# Patient Record
Sex: Female | Born: 1955 | Race: White | Hispanic: No | Marital: Single | Smoking: Never smoker
Health system: Southern US, Community
[De-identification: ages and names within clinical notes are randomized; demographics above are authoritative.]

## PROBLEM LIST (undated history)

## (undated) DIAGNOSIS — R002 Palpitations: Secondary | ICD-10-CM

## (undated) DIAGNOSIS — I10 Essential (primary) hypertension: Secondary | ICD-10-CM

## (undated) DIAGNOSIS — F319 Bipolar disorder, unspecified: Secondary | ICD-10-CM

## (undated) DIAGNOSIS — F329 Major depressive disorder, single episode, unspecified: Secondary | ICD-10-CM

## (undated) DIAGNOSIS — E7800 Pure hypercholesterolemia, unspecified: Secondary | ICD-10-CM

## (undated) DIAGNOSIS — R011 Cardiac murmur, unspecified: Secondary | ICD-10-CM

## (undated) DIAGNOSIS — M549 Dorsalgia, unspecified: Secondary | ICD-10-CM

## (undated) DIAGNOSIS — F32A Depression, unspecified: Secondary | ICD-10-CM

## (undated) DIAGNOSIS — G8929 Other chronic pain: Secondary | ICD-10-CM

## (undated) DIAGNOSIS — Z8711 Personal history of peptic ulcer disease: Secondary | ICD-10-CM

## (undated) DIAGNOSIS — M1990 Unspecified osteoarthritis, unspecified site: Secondary | ICD-10-CM

## (undated) DIAGNOSIS — F419 Anxiety disorder, unspecified: Secondary | ICD-10-CM

## (undated) DIAGNOSIS — K219 Gastro-esophageal reflux disease without esophagitis: Secondary | ICD-10-CM

## (undated) DIAGNOSIS — Z8719 Personal history of other diseases of the digestive system: Secondary | ICD-10-CM

## (undated) DIAGNOSIS — G479 Sleep disorder, unspecified: Secondary | ICD-10-CM

## (undated) DIAGNOSIS — K449 Diaphragmatic hernia without obstruction or gangrene: Secondary | ICD-10-CM

## (undated) HISTORY — DX: Major depressive disorder, single episode, unspecified: F32.9

## (undated) HISTORY — DX: Depression, unspecified: F32.A

## (undated) HISTORY — PX: CARPAL TUNNEL RELEASE: SHX101

## (undated) HISTORY — DX: Pure hypercholesterolemia, unspecified: E78.00

## (undated) HISTORY — PX: BACK SURGERY: SHX140

## (undated) HISTORY — DX: Anxiety disorder, unspecified: F41.9

## (undated) HISTORY — DX: Essential (primary) hypertension: I10

## (undated) HISTORY — DX: Palpitations: R00.2

## (undated) HISTORY — PX: APPENDECTOMY: SHX54

## (undated) HISTORY — DX: Diaphragmatic hernia without obstruction or gangrene: K44.9

---

## 2001-01-06 ENCOUNTER — Inpatient Hospital Stay (HOSPITAL_COMMUNITY): Admission: AD | Admit: 2001-01-06 | Discharge: 2001-01-06 | Payer: Self-pay | Admitting: *Deleted

## 2009-06-17 ENCOUNTER — Emergency Department (HOSPITAL_COMMUNITY): Admission: EM | Admit: 2009-06-17 | Discharge: 2009-06-17 | Payer: Self-pay | Admitting: Emergency Medicine

## 2010-07-29 LAB — POCT I-STAT, CHEM 8
BUN: 11 mg/dL (ref 6–23)
Glucose, Bld: 76 mg/dL (ref 70–99)
HCT: 35 % — ABNORMAL LOW (ref 36.0–46.0)
Hemoglobin: 11.9 g/dL — ABNORMAL LOW (ref 12.0–15.0)
TCO2: 27 mmol/L (ref 0–100)

## 2010-07-29 LAB — POCT CARDIAC MARKERS
CKMB, poc: 1.6 ng/mL (ref 1.0–8.0)
CKMB, poc: 1.7 ng/mL (ref 1.0–8.0)
Myoglobin, poc: 39.4 ng/mL (ref 12–200)
Troponin i, poc: <0.05 ng/mL (ref 0.00–0.09)

## 2010-07-29 LAB — CBC
HCT: 34.6 % — ABNORMAL LOW (ref 36.0–46.0)
Platelets: 190 10*3/uL (ref 150–400)
RBC: 3.75 MIL/uL — ABNORMAL LOW (ref 3.87–5.11)
RDW: 13.4 % (ref 11.5–15.5)

## 2010-07-29 LAB — DIFFERENTIAL
Basophils Absolute: 0 10*3/uL (ref 0.0–0.1)
Eosinophils Absolute: 0.1 10*3/uL (ref 0.0–0.7)
Eosinophils Relative: 3 % (ref 0–5)
Lymphocytes Relative: 37 % (ref 12–46)
Monocytes Relative: 10 % (ref 3–12)
Neutrophils Relative %: 48 % (ref 43–77)

## 2010-07-29 LAB — PROTIME-INR: INR: 0.96 (ref 0.00–1.49)

## 2010-07-29 LAB — URINALYSIS, ROUTINE W REFLEX MICROSCOPIC
Bilirubin Urine: NEGATIVE
Glucose, UA: NEGATIVE mg/dL
pH: 7 (ref 5.0–8.0)

## 2011-02-08 ENCOUNTER — Observation Stay (HOSPITAL_COMMUNITY)
Admission: EM | Admit: 2011-02-08 | Discharge: 2011-02-09 | Disposition: A | Discharge status: Home / Self Care | Payer: Self-pay | Attending: Emergency Medicine | Admitting: Emergency Medicine

## 2011-02-08 DIAGNOSIS — F411 Generalized anxiety disorder: Secondary | ICD-10-CM | POA: Insufficient documentation

## 2011-02-08 DIAGNOSIS — K449 Diaphragmatic hernia without obstruction or gangrene: Secondary | ICD-10-CM | POA: Insufficient documentation

## 2011-02-08 DIAGNOSIS — R0602 Shortness of breath: Secondary | ICD-10-CM | POA: Insufficient documentation

## 2011-02-08 DIAGNOSIS — R079 Chest pain, unspecified: Principal | ICD-10-CM | POA: Insufficient documentation

## 2011-02-08 LAB — COMPREHENSIVE METABOLIC PANEL
ALT: 11 U/L (ref 0–35)
AST: 17 U/L (ref 0–37)
Albumin: 4.3 g/dL (ref 3.5–5.2)
BUN: 25 mg/dL — ABNORMAL HIGH (ref 6–23)
Creatinine, Ser: 0.95 mg/dL (ref 0.50–1.10)
GFR calc non Af Amer: 66 mL/min — ABNORMAL LOW (ref >90)
Sodium: 140 mEq/L (ref 135–145)

## 2011-02-08 LAB — DIFFERENTIAL
Basophils Relative: 1 % (ref 0–1)
Eosinophils Absolute: 0.2 10*3/uL (ref 0.0–0.7)
Eosinophils Relative: 2 % (ref 0–5)
Lymphs Abs: 2.2 10*3/uL (ref 0.7–4.0)
Neutro Abs: 5.5 10*3/uL (ref 1.7–7.7)
Neutrophils Relative %: 64 % (ref 43–77)

## 2011-02-08 LAB — CBC
Hemoglobin: 13.3 g/dL (ref 12.0–15.0)
MCHC: 35.5 g/dL (ref 30.0–36.0)
MCV: 88.4 fL (ref 78.0–100.0)
RBC: 4.24 MIL/uL (ref 3.87–5.11)
RDW: 12.4 % (ref 11.5–15.5)

## 2011-02-08 LAB — POCT I-STAT TROPONIN I: Troponin i, poc: 0 ng/mL (ref 0.00–0.08)

## 2011-02-08 LAB — CK TOTAL AND CKMB (NOT AT ARMC): Total CK: 98 U/L (ref 7–177)

## 2011-02-08 LAB — LIPASE, BLOOD: Lipase: 48 U/L (ref 11–59)

## 2011-02-09 ENCOUNTER — Telehealth: Payer: Self-pay | Admitting: *Deleted

## 2011-02-09 DIAGNOSIS — I4891 Unspecified atrial fibrillation: Secondary | ICD-10-CM

## 2011-02-09 LAB — POCT I-STAT TROPONIN I

## 2011-02-09 NOTE — Telephone Encounter (Signed)
Dr Elease Hashimoto saw pt in ER, wants lexiscan/ cp,  scheduled and f/u visit in a few weeks. Cell number [phone]

## 2011-02-10 ENCOUNTER — Telehealth: Payer: Self-pay | Admitting: *Deleted

## 2011-02-10 NOTE — Telephone Encounter (Signed)
Message copied by Antony Odea on Thu Feb 10, 2011  3:16 PM ------      Message from: Antony Odea      Created: Wed Feb 09, 2011  1:38 PM      Regarding: lexi app        [phone]

## 2011-02-10 NOTE — Telephone Encounter (Signed)
Pt not on bp meds.

## 2011-02-22 ENCOUNTER — Other Ambulatory Visit (HOSPITAL_COMMUNITY): Payer: Self-pay | Admitting: Radiology

## 2011-03-02 ENCOUNTER — Ambulatory Visit (HOSPITAL_COMMUNITY): Payer: Self-pay | Attending: Cardiovascular Disease | Admitting: Radiology

## 2011-03-02 ENCOUNTER — Other Ambulatory Visit (HOSPITAL_COMMUNITY): Payer: Self-pay | Admitting: Radiology

## 2011-03-02 DIAGNOSIS — R0609 Other forms of dyspnea: Secondary | ICD-10-CM

## 2011-03-02 DIAGNOSIS — R0789 Other chest pain: Secondary | ICD-10-CM

## 2011-03-02 DIAGNOSIS — R079 Chest pain, unspecified: Secondary | ICD-10-CM | POA: Insufficient documentation

## 2011-03-02 MED ORDER — TECHNETIUM TC 99M TETROFOSMIN IV KIT
11.0000 | PACK | Freq: Once | INTRAVENOUS | Status: AC | PRN
  Administered 2011-03-02: 11 via INTRAVENOUS

## 2011-03-02 MED ORDER — REGADENOSON 0.4 MG/5ML IV SOLN
0.4000 mg | Freq: Once | INTRAVENOUS | Status: AC
  Administered 2011-03-02: 0.4 mg via INTRAVENOUS

## 2011-03-02 MED ORDER — TECHNETIUM TC 99M TETROFOSMIN IV KIT
33.0000 | PACK | Freq: Once | INTRAVENOUS | Status: AC | PRN
  Administered 2011-03-02: 33 via INTRAVENOUS

## 2011-03-02 NOTE — Progress Notes (Signed)
[facility] SITE 3 NUCLEAR MED [address] [place] [address] [phone]  Cardiology Nuclear Med Study  [name] is a 55 y.o. female [id number] 1955/11/04   Nuclear Med Background Indication for Stress Test:  Evaluation for Ischemia and Post Hospital 02/10/11 with CP, (-) enzymes History:  02/09/11 Stress Echo:Poor exercise tolerance, no wall abnormalities>Myoview ordered Cardiac Risk Factors: Family History - CAD  Symptoms:  Chest Pain/Pressure with and without Exertion, radiating to (L) arm (last episode of chest discomfort ), DOE, Fatigue, Nausea, Palpitations, Rapid HR and Vomiting   Nuclear Pre-Procedure Caffeine/Decaff Intake:  None NPO After: 7:00pm   Lungs:  Clear.  O2 SAT 99% on RA. IV 0.9% NS with Angio Cath:  20g  IV Site: R Antecubital  IV Started by:  Stanton Kidney, EMT-P  Chest Size (in):  32 Cup Size: B  Height: 5\' 3"  (1.6 m)  Weight:  164 lb (74.39 kg)  BMI:  Body mass index is 29.05 kg/(m^2). Tech Comments:  NA    Nuclear Med Study 1 or 2 day study: 1 day  Stress Test Type:  Lexiscan  Reading MD: Willa Rough, MD  Order Authorizing Provider:  Kristeen Miss, MD  Resting Radionuclide: Technetium 20m Tetrofosmin  Resting Radionuclide Dose: 11.0 mCi   Stress Radionuclide:  Technetium 30m Tetrofosmin  Stress Radionuclide Dose: 33.0 mCi           Stress Protocol Rest HR: 78 Stress HR: 116  Rest BP: 146/94 Stress BP: 151/91  Exercise Time (min): n/a METS: n/a   Predicted Max HR: 165 bpm % Max HR: 70.3 bpm Rate Pressure Product: 21308   Dose of Adenosine (mg):  n/a Dose of Lexiscan: 0.4 mg  Dose of Atropine (mg): n/a Dose of Dobutamine: n/a mcg/kg/min (at max HR)  Stress Test Technologist: Smiley Houseman, CMA-N  Nuclear Technologist:  Domenic Polite, CNMT     Rest Procedure:  Myocardial perfusion imaging was performed at rest 45 minutes following the intravenous administration of Technetium 73m Tetrofosmin.  Rest ECG: No  acute changes.  Stress Procedure:  The patient received IV Lexiscan 0.4 mg over 15-seconds.  Technetium 74m Tetrofosmin injected at 30-seconds.  There were nonspecific T-wave changes with Lexiscan, probably rare related.  Quantitative spect images were obtained after a 45 minute delay.  Stress ECG: No significant change from baseline ECG   QPS Raw Data Images:  Patient motion noted; appropriate software correction applied. Stress Images:  Normal homogeneous uptake in all areas of the myocardium. Rest Images:  Normal homogeneous uptake in all areas of the myocardium. Subtraction (SDS):  No evidence of ischemia. Transient Ischemic Dilatation (Normal <1.22):  1.07 Lung/Heart Ratio (Normal <0.45):  0.33  Quantitative Gated Spect Images QGS EDV:  60 ml QGS ESV:  21 ml QGS cine images:  Normal Wall Motion QGS EF: 65%  Impression Exercise Capacity:  Lexiscan with no exercise. BP Response:  Normal blood pressure response. Clinical Symptoms:  breathing hard ECG Impression:  No significant ST segment change suggestive of ischemia. Comparison with Prior Nuclear Study: No previous nuclear study performed  Overall Impression:  Normal stress nuclear study.  Willa Rough

## 2011-03-04 ENCOUNTER — Ambulatory Visit: Payer: Self-pay | Admitting: Cardiovascular Disease

## 2011-03-04 ENCOUNTER — Encounter: Payer: Self-pay | Admitting: Cardiovascular Disease

## 2011-03-07 ENCOUNTER — Telehealth: Payer: Self-pay | Admitting: *Deleted

## 2011-03-07 NOTE — Telephone Encounter (Signed)
Message copied by Antony Odea on Mon Mar 07, 2011  3:13 PM ------      Message from: [place], [place] J      Created: Thu Mar 03, 2011 12:45 PM       Normal stress test

## 2011-03-07 NOTE — Telephone Encounter (Signed)
Pt called for results, returned call, she missed app for 10/26, normal results given and app made for f/u.Pt verbalized understanding. Alfonso Ramus RN

## 2011-03-16 ENCOUNTER — Encounter: Payer: Self-pay | Admitting: Cardiovascular Disease

## 2011-03-18 ENCOUNTER — Ambulatory Visit: Payer: Self-pay | Admitting: Cardiovascular Disease

## 2012-04-11 ENCOUNTER — Other Ambulatory Visit (HOSPITAL_COMMUNITY): Payer: Self-pay | Admitting: Physician Assistant

## 2012-04-11 DIAGNOSIS — Z139 Encounter for screening, unspecified: Secondary | ICD-10-CM

## 2012-04-20 ENCOUNTER — Ambulatory Visit (HOSPITAL_COMMUNITY): Payer: Self-pay

## 2012-04-24 ENCOUNTER — Ambulatory Visit (HOSPITAL_COMMUNITY): Payer: Self-pay

## 2012-04-30 ENCOUNTER — Ambulatory Visit (HOSPITAL_COMMUNITY): Payer: Self-pay

## 2012-05-10 ENCOUNTER — Ambulatory Visit (HOSPITAL_COMMUNITY): Payer: Self-pay

## 2012-05-11 ENCOUNTER — Ambulatory Visit (HOSPITAL_COMMUNITY)
Admission: RE | Admit: 2012-05-11 | Discharge: 2012-05-11 | Disposition: A | Discharge status: Home / Self Care | Payer: Self-pay | Source: Ambulatory Visit | Attending: Physician Assistant | Admitting: Physician Assistant

## 2012-05-11 DIAGNOSIS — Z139 Encounter for screening, unspecified: Secondary | ICD-10-CM

## 2012-12-04 ENCOUNTER — Emergency Department (HOSPITAL_COMMUNITY): Payer: Medicaid Other

## 2012-12-04 ENCOUNTER — Encounter (HOSPITAL_COMMUNITY): Payer: Self-pay | Admitting: *Deleted

## 2012-12-04 ENCOUNTER — Other Ambulatory Visit (HOSPITAL_COMMUNITY): Payer: Self-pay | Admitting: Physician Assistant

## 2012-12-04 ENCOUNTER — Emergency Department (HOSPITAL_COMMUNITY)
Admission: EM | Admit: 2012-12-04 | Discharge: 2012-12-04 | Disposition: A | Discharge status: Home / Self Care | Payer: Medicaid Other | Attending: Emergency Medicine | Admitting: Emergency Medicine

## 2012-12-04 ENCOUNTER — Ambulatory Visit (HOSPITAL_COMMUNITY)
Admission: RE | Admit: 2012-12-04 | Discharge: 2012-12-04 | Disposition: A | Discharge status: Home / Self Care | Payer: Medicaid Other | Source: Ambulatory Visit | Attending: Physician Assistant | Admitting: Physician Assistant

## 2012-12-04 DIAGNOSIS — K2970 Gastritis, unspecified, without bleeding: Secondary | ICD-10-CM

## 2012-12-04 DIAGNOSIS — R109 Unspecified abdominal pain: Secondary | ICD-10-CM

## 2012-12-04 DIAGNOSIS — E876 Hypokalemia: Secondary | ICD-10-CM

## 2012-12-04 DIAGNOSIS — R630 Anorexia: Secondary | ICD-10-CM | POA: Insufficient documentation

## 2012-12-04 DIAGNOSIS — Z8719 Personal history of other diseases of the digestive system: Secondary | ICD-10-CM | POA: Insufficient documentation

## 2012-12-04 DIAGNOSIS — Z79899 Other long term (current) drug therapy: Secondary | ICD-10-CM | POA: Insufficient documentation

## 2012-12-04 DIAGNOSIS — K8020 Calculus of gallbladder without cholecystitis without obstruction: Secondary | ICD-10-CM

## 2012-12-04 DIAGNOSIS — Z9089 Acquired absence of other organs: Secondary | ICD-10-CM | POA: Insufficient documentation

## 2012-12-04 DIAGNOSIS — F411 Generalized anxiety disorder: Secondary | ICD-10-CM | POA: Insufficient documentation

## 2012-12-04 DIAGNOSIS — M549 Dorsalgia, unspecified: Secondary | ICD-10-CM | POA: Insufficient documentation

## 2012-12-04 DIAGNOSIS — Z791 Long term (current) use of non-steroidal anti-inflammatories (NSAID): Secondary | ICD-10-CM | POA: Insufficient documentation

## 2012-12-04 LAB — URINE MICROSCOPIC-ADD ON

## 2012-12-04 LAB — CBC WITH DIFFERENTIAL/PLATELET
Eosinophils Absolute: 0.2 10*3/uL (ref 0.0–0.7)
Eosinophils Relative: 3 % (ref 0–5)
HCT: 35.2 % — ABNORMAL LOW (ref 36.0–46.0)
Lymphs Abs: 1.5 10*3/uL (ref 0.7–4.0)
MCH: 30.9 pg (ref 26.0–34.0)
MCV: 90.7 fL (ref 78.0–100.0)
Monocytes Absolute: 0.4 10*3/uL (ref 0.1–1.0)
Platelets: 215 10*3/uL (ref 150–400)
RBC: 3.88 MIL/uL (ref 3.87–5.11)
RDW: 12.8 % (ref 11.5–15.5)

## 2012-12-04 LAB — URINALYSIS, ROUTINE W REFLEX MICROSCOPIC
Protein, ur: NEGATIVE mg/dL
Urobilinogen, UA: 0.2 mg/dL (ref 0.0–1.0)

## 2012-12-04 LAB — COMPREHENSIVE METABOLIC PANEL
ALT: 11 U/L (ref 0–35)
Calcium: 9.9 mg/dL (ref 8.4–10.5)
Creatinine, Ser: 0.76 mg/dL (ref 0.50–1.10)
GFR calc Af Amer: >90 mL/min (ref >90)
GFR calc non Af Amer: >90 mL/min (ref >90)
Glucose, Bld: 88 mg/dL (ref 70–99)
Sodium: 137 mEq/L (ref 135–145)
Total Protein: 8.2 g/dL (ref 6.0–8.3)

## 2012-12-04 LAB — LIPASE, BLOOD: Lipase: 30 U/L (ref 11–59)

## 2012-12-04 MED ORDER — OMEPRAZOLE 20 MG PO CPDR: 20.0000 mg | DELAYED_RELEASE_CAPSULE | Freq: Every day | ORAL | Status: DC

## 2012-12-04 MED ORDER — MORPHINE SULFATE 4 MG/ML IJ SOLN: 4.0000 mg | Freq: Once | INTRAMUSCULAR | Status: DC

## 2012-12-04 MED ORDER — SODIUM CHLORIDE 0.9 % IV BOLUS (SEPSIS)
500.0000 mL | Freq: Once | INTRAVENOUS | Status: AC
  Administered 2012-12-04: 500 mL via INTRAVENOUS

## 2012-12-04 MED ORDER — GI COCKTAIL ~~LOC~~
ORAL | Status: AC
  Filled 2012-12-04: qty 30

## 2012-12-04 MED ORDER — GI COCKTAIL ~~LOC~~
30.0000 mL | Freq: Once | ORAL | Status: AC
  Administered 2012-12-04: 30 mL via ORAL

## 2012-12-04 NOTE — ED Notes (Signed)
Patient with no complaints at this time. Respirations even and unlabored. Skin warm/dry. Discharge instructions reviewed with patient at this time. Patient given opportunity to voice concerns/ask questions. IV removed per policy and band-aid applied to site. Patient discharged at this time and left Emergency Department with steady gait.  

## 2012-12-04 NOTE — ED Notes (Signed)
Upper abd pain and back pain x 5 days.  Denies n/v/d.   Decreased appetite.

## 2012-12-04 NOTE — ED Notes (Signed)
Pt returned from ultrasound, states that the gi cocktail seemed to help with the pain ,

## 2012-12-04 NOTE — ED Notes (Addendum)
Pt presents to er with c/o abd pain for the past 5 days, denies any n/v/d, has hx of hiatal hernia and states that she first started having the pain after carrying groceries into the house and helping lift a cough. Pain is located epigastric area and radiates around to left abd area.  [facility] and [facility]d. [facility] did not notify [facility] that she now has [facility], once [facility] was notified that pt had [facility] all exams were canceled and pt was advised that if she was hurting she would need to be seen in er.

## 2012-12-04 NOTE — ED Provider Notes (Signed)
CSN: [id number]     Arrival date & time 12/04/12  1051 History    This chart was scribed for No att. providers found, by Yevette Edwards, ED Scribe. This patient was seen in room APA06/[facility]06 and the patient's care was started at 11:46 AM.   None    Chief Complaint  Patient presents with  . Abdominal Pain    The history is provided by the patient. No language interpreter was used.   HPI Comments:  [name] is a 57 y.o. female who presents to the Emergency Department complaining of constant abdominal pain which radiates from her epigastrium to her periumbilical area and to her back. The pt states she has experienced the abdominal pain for the past five days. She ranks the pain as a 8/10 or a 9/10. The pt has attempted to treat the pain with Ben-gay, but with little resolution. She states that eating increases the abdominal pain. The pt has also experienced a diminished appetite. Her last BM was yesterday, and it was normal. She denies a h/o similar symptoms. The pt also denies experiencing any fever, emesis, cough, diarrhea, dysuria, or hematuria. The pt denies a h/o pancreatitis and ulcers.  The pt has a h/o abdominal surgeries. She denies drinking alcohol and smoking.  Past Medical History  Diagnosis Date  . Chest pain   . Hiatal hernia   . SOB (shortness of breath)   . Palpitations   . PND (paroxysmal nocturnal dyspnea)   . Anxiety    Past Surgical History  Procedure Laterality Date  . Appendectomy    . Carpel tunnel     Family History  Problem Relation Age of Onset  . Heart attack Mother    History  Substance Use Topics  . Smoking status: Never Smoker   . Smokeless tobacco: Not on file  . Alcohol Use: No   No OB history provided.  Review of Systems  Constitutional: Positive for appetite change. Negative for fever.  HENT: Negative for congestion, sore throat and rhinorrhea.   Respiratory: Negative for cough.   Gastrointestinal: Positive for abdominal pain. Negative  for nausea, vomiting and diarrhea.  Genitourinary: Negative for dysuria and hematuria.  Musculoskeletal: Positive for back pain.  Skin: Negative for rash.  All other systems reviewed and are negative.    Allergies  Review of patient's allergies indicates no known allergies.  Home Medications   Current Outpatient Rx  Name  Route  Sig  Dispense  Refill  . ALPRAZolam (XANAX) 1 MG tablet   Oral   Take 2 mg by mouth 3 (three) times daily as needed for anxiety.          . Aspirin-Acetaminophen (GOODYS BODY PAIN PO)   Oral   Take 1 Package by mouth daily as needed (pain).         Marland Kitchen lamoTRIgine (LAMICTAL) 200 MG tablet   Oral   Take 200 mg by mouth 3 (three) times daily.         Marland Kitchen lisinopril (PRINIVIL,ZESTRIL) 20 MG tablet   Oral   Take 20 mg by mouth daily.         . meloxicam (MOBIC) 15 MG tablet   Oral   Take 15 mg by mouth daily.         . simvastatin (ZOCOR) 20 MG tablet   Oral   Take 20 mg by mouth every evening.          Triage Vitals: BP 162/99  Pulse 89  Temp(Src) 98.6 F (37 C) (Oral)  Resp 16  Ht 5\' 3"  (1.6 m)  Wt 142 lb (64.411 kg)  BMI 25.16 kg/m2  SpO2 100%  Physical Exam  Nursing note and vitals reviewed. Constitutional: She is oriented to person, place, and time. She appears well-developed and well-nourished. No distress.  HENT:  Head: Normocephalic and atraumatic.  Mouth/Throat: Oropharynx is clear and moist.  Eyes: EOM are normal. Pupils are equal, round, and reactive to light.  Neck: Neck supple. No tracheal deviation present.  Cardiovascular: Normal rate, regular rhythm, normal heart sounds and intact distal pulses.   Pulmonary/Chest: Effort normal and breath sounds normal. No respiratory distress.  Abdominal: Soft. Bowel sounds are normal. She exhibits no distension. There is tenderness.  Epigastrium tenderness.   Musculoskeletal: Normal range of motion.  Neurological: She is alert and oriented to person, place, and time.  Skin:  Skin is warm and dry.  Psychiatric: She has a normal mood and affect. Her behavior is normal.    ED Course   DIAGNOSTIC STUDIES: Oxygen Saturation is 100% on room air, normal by my interpretation.    COORDINATION OF CARE:  11:53 AM- Discussed treatment plan with patient, and the patient agreed to the plan.   Procedures (including critical care time)  Results for orders placed during the hospital encounter of 12/04/12  URINALYSIS, ROUTINE W REFLEX MICROSCOPIC      Result Value Range   Color, Urine YELLOW  YELLOW   APPearance CLEAR  CLEAR   Specific Gravity, Urine >1.030 (*) 1.005 - 1.030   pH 6.0  5.0 - 8.0   Glucose, UA NEGATIVE  NEGATIVE mg/dL   Hgb urine dipstick TRACE (*) NEGATIVE   Bilirubin Urine NEGATIVE  NEGATIVE   Ketones, ur NEGATIVE  NEGATIVE mg/dL   Protein, ur NEGATIVE  NEGATIVE mg/dL   Urobilinogen, UA 0.2  0.0 - 1.0 mg/dL   Nitrite NEGATIVE  NEGATIVE   Leukocytes, UA TRACE (*) NEGATIVE  URINE MICROSCOPIC-ADD ON      Result Value Range   Squamous Epithelial / LPF FEW (*) RARE   WBC, UA 0-2  <3 WBC/hpf   RBC / HPF 0-2  <3 RBC/hpf  CBC WITH DIFFERENTIAL      Result Value Range   WBC 8.1  4.0 - 10.5 K/uL   RBC 3.88  3.87 - 5.11 MIL/uL   Hemoglobin 12.0  12.0 - 15.0 g/dL   HCT 16.1 (*) 09.6 - 04.5 %   MCV 90.7  78.0 - 100.0 fL   MCH 30.9  26.0 - 34.0 pg   MCHC 34.1  30.0 - 36.0 g/dL   RDW 40.9  81.1 - 91.4 %   Platelets 215  150 - 400 K/uL   Neutrophils Relative % 73  43 - 77 %   Neutro Abs 5.9  1.7 - 7.7 K/uL   Lymphocytes Relative 19  12 - 46 %   Lymphs Abs 1.5  0.7 - 4.0 K/uL   Monocytes Relative 5  3 - 12 %   Monocytes Absolute 0.4  0.1 - 1.0 K/uL   Eosinophils Relative 3  0 - 5 %   Eosinophils Absolute 0.2  0.0 - 0.7 K/uL   Basophils Relative 1  0 - 1 %   Basophils Absolute 0.1  0.0 - 0.1 K/uL  COMPREHENSIVE METABOLIC PANEL      Result Value Range   Sodium 137  135 - 145 mEq/L   Potassium 3.2 (*) 3.5 - 5.1 mEq/L  Chloride 100  96 - 112 mEq/L    CO2 28  19 - 32 mEq/L   Glucose, Bld 88  70 - 99 mg/dL   BUN 16  6 - 23 mg/dL   Creatinine, Ser 1.30  0.50 - 1.10 mg/dL   Calcium 9.9  8.4 - 86.5 mg/dL   Total Protein 8.2  6.0 - 8.3 g/dL   Albumin 4.2  3.5 - 5.2 g/dL   AST 18  0 - 37 U/L   ALT 11  0 - 35 U/L   Alkaline Phosphatase 117  39 - 117 U/L   Total Bilirubin 0.3  0.3 - 1.2 mg/dL   GFR calc non Af Amer >90  >90 mL/min   GFR calc Af Amer >90  >90 mL/min  LIPASE, BLOOD      Result Value Range   Lipase 30  11 - 59 U/L   Dg Chest Portable 1 View  12/04/2012   *RADIOLOGY REPORT*  Clinical Data: Epigastric pain since last Thursday.  History of hypertension, hiatal hernia.  PORTABLE CHEST - 1 VIEW  Comparison: 06/17/2009  Findings: Cardiomediastinal silhouette is within normal limits. There are no focal consolidations or pleural effusions.  There is minimal atelectasis or scarring at the right lung base.  No free intraperitoneal air seen beneath the diaphragm.  IMPRESSION: No evidence for acute cardiopulmonary abnormality.   Original Report Authenticated By: Norva Pavlov, M.D.   US Abdomen Limited Ruq  12/04/2012   *RADIOLOGY REPORT*  Clinical Data:  Right upper quadrant pain.  Previous appendectomy.  LIMITED ABDOMINAL ULTRASOUND - RIGHT UPPER QUADRANT  Comparison:  None.  Findings:  Gallbladder:  Multiple gallstones are present.  No sonographic Murphy's sign.  Gallbladder wall is normal in thickness, 1.6 mm. No pericholecystic fluid.  Common bile duct:  Common bile duct is normal in caliber, 5.0 mm.  Liver:  A simple cyst is seen in the anterior aspect of the left hepatic lobe, 3.0 x 2.9 x 4.4 cm.  This abuts the hepatic capsule and could be a source of pain.  No perihepatic fluid.  IMPRESSION:  1.  Multiple gallstones without other evidence for acute cholecystitis. 2.  4.4 cm simple cyst in the left hepatic lobe, a possible source of the patient's pain.                    Original Report Authenticated By: Norva Pavlov, M.D.       MDM  Ms. Texidor presented to the emergency department with epigastric pain radiating to her upper back. Her vitals were all stable. Her exam is consistent with probable gastritis versus pancreatitis versus gallbladder disease. Labs are reassuring. Ultrasound of gallbladder shows stones without acute cholecystitis. Patient reassessed and much improved after GI cocktail. Suspect most likely etiology is esophagitis versus gastritis versus ulcer. Will DC patient with PPI, followup and return precautions.  Doubt acute surgical, vascular or serious infectious etiology at this time.  UA with ?UTI, but pt w/o sx.  Will send cx  I personally performed the services described in this documentation, which was scribed in my presence. The recorded information has been reviewed and is accurate.    Ashby Dawes, MD 12/04/12 [phone]

## 2012-12-04 NOTE — ED Notes (Signed)
Dr. Dan Humphreys at bedside,

## 2013-01-16 ENCOUNTER — Emergency Department (HOSPITAL_COMMUNITY)
Admission: EM | Admit: 2013-01-16 | Discharge: 2013-01-16 | Discharge status: Against Medical Advice | Payer: Medicaid Other | Attending: Emergency Medicine | Admitting: Emergency Medicine

## 2013-01-16 ENCOUNTER — Encounter (HOSPITAL_COMMUNITY): Payer: Self-pay | Admitting: *Deleted

## 2013-01-16 DIAGNOSIS — K8020 Calculus of gallbladder without cholecystitis without obstruction: Secondary | ICD-10-CM | POA: Insufficient documentation

## 2013-01-16 NOTE — ED Notes (Signed)
Pt with abd pain, seen recently and dx with gallstones, was instructed to come back if pain gets worse, c/o N/V/d

## 2013-01-16 NOTE — ED Notes (Signed)
Pt stated that she "can't stay for 3 hours to be seen", AMA form signed, pt stated that she would come back in the morning

## 2013-01-17 ENCOUNTER — Emergency Department (HOSPITAL_COMMUNITY)
Admission: EM | Admit: 2013-01-17 | Discharge: 2013-01-17 | Disposition: A | Discharge status: Home / Self Care | Payer: Medicaid Other | Attending: Emergency Medicine | Admitting: Emergency Medicine

## 2013-01-17 ENCOUNTER — Encounter (HOSPITAL_COMMUNITY): Payer: Self-pay | Admitting: Emergency Medicine

## 2013-01-17 DIAGNOSIS — R112 Nausea with vomiting, unspecified: Secondary | ICD-10-CM | POA: Insufficient documentation

## 2013-01-17 DIAGNOSIS — F411 Generalized anxiety disorder: Secondary | ICD-10-CM | POA: Insufficient documentation

## 2013-01-17 DIAGNOSIS — K8020 Calculus of gallbladder without cholecystitis without obstruction: Secondary | ICD-10-CM | POA: Insufficient documentation

## 2013-01-17 DIAGNOSIS — Z8719 Personal history of other diseases of the digestive system: Secondary | ICD-10-CM | POA: Insufficient documentation

## 2013-01-17 DIAGNOSIS — Z79899 Other long term (current) drug therapy: Secondary | ICD-10-CM | POA: Insufficient documentation

## 2013-01-17 DIAGNOSIS — Z8679 Personal history of other diseases of the circulatory system: Secondary | ICD-10-CM | POA: Insufficient documentation

## 2013-01-17 LAB — CBC WITH DIFFERENTIAL/PLATELET
Hemoglobin: 11.2 g/dL — ABNORMAL LOW (ref 12.0–15.0)
Lymphocytes Relative: 40 % (ref 12–46)
Lymphs Abs: 2 10*3/uL (ref 0.7–4.0)
MCH: 30.7 pg (ref 26.0–34.0)
Monocytes Relative: 8 % (ref 3–12)
Neutro Abs: 2.5 10*3/uL (ref 1.7–7.7)
Neutrophils Relative %: 50 % (ref 43–77)
Platelets: 191 10*3/uL (ref 150–400)
RBC: 3.65 MIL/uL — ABNORMAL LOW (ref 3.87–5.11)
WBC: 4.9 10*3/uL (ref 4.0–10.5)

## 2013-01-17 LAB — URINALYSIS, ROUTINE W REFLEX MICROSCOPIC
Glucose, UA: NEGATIVE mg/dL
Ketones, ur: NEGATIVE mg/dL
Nitrite: NEGATIVE
Protein, ur: NEGATIVE mg/dL
pH: 7 (ref 5.0–8.0)

## 2013-01-17 LAB — COMPREHENSIVE METABOLIC PANEL
AST: 20 U/L (ref 0–37)
Albumin: 3.9 g/dL (ref 3.5–5.2)
BUN: 18 mg/dL (ref 6–23)
Calcium: 9.5 mg/dL (ref 8.4–10.5)
Chloride: 103 mEq/L (ref 96–112)
Creatinine, Ser: 0.67 mg/dL (ref 0.50–1.10)
Total Bilirubin: 0.2 mg/dL — ABNORMAL LOW (ref 0.3–1.2)

## 2013-01-17 LAB — LIPASE, BLOOD: Lipase: 47 U/L (ref 11–59)

## 2013-01-17 LAB — URINE MICROSCOPIC-ADD ON

## 2013-01-17 MED ORDER — MORPHINE SULFATE 4 MG/ML IJ SOLN
4.0000 mg | Freq: Once | INTRAMUSCULAR | Status: AC
  Administered 2013-01-17: 4 mg via INTRAVENOUS
  Filled 2013-01-17: qty 1

## 2013-01-17 MED ORDER — OMEPRAZOLE 20 MG PO CPDR: 20.0000 mg | DELAYED_RELEASE_CAPSULE | Freq: Every day | ORAL | Status: DC

## 2013-01-17 MED ORDER — PANTOPRAZOLE SODIUM 40 MG IV SOLR
40.0000 mg | Freq: Once | INTRAVENOUS | Status: AC
  Administered 2013-01-17: 40 mg via INTRAVENOUS
  Filled 2013-01-17: qty 40

## 2013-01-17 MED ORDER — OXYCODONE-ACETAMINOPHEN 5-325 MG PO TABS: 1.0000 | ORAL_TABLET | ORAL | Status: DC | PRN

## 2013-01-17 MED ORDER — ONDANSETRON 8 MG PO TBDP: 8.0000 mg | ORAL_TABLET | Freq: Three times a day (TID) | ORAL | Status: DC | PRN

## 2013-01-17 MED ORDER — ONDANSETRON HCL 4 MG/2ML IJ SOLN
4.0000 mg | Freq: Once | INTRAMUSCULAR | Status: AC
  Administered 2013-01-17: 4 mg via INTRAVENOUS
  Filled 2013-01-17: qty 2

## 2013-01-17 NOTE — ED Notes (Signed)
Pt ambulated to restroom with no issues.  

## 2013-01-17 NOTE — ED Notes (Signed)
Pt reports she has gallstones, first dx with it about 2 weeks ago. Seen at [facility], given prescription, filled it and finished it. Pt reports she was told that if the pain gets worse to come back. Pt reports the pain has just gotten so bad, especially after she eats. Reports she has been taking goody powder at home, gets a little relief but not enough, can't get a whole night of sleep.

## 2013-01-17 NOTE — ED Notes (Signed)
Pt made aware of need for urine sample.  

## 2013-01-17 NOTE — ED Provider Notes (Signed)
CSN: [id number]     Arrival date & time 01/17/13  4098 History   First MD Initiated Contact with Patient 01/17/13 [phone]     Chief Complaint  Patient presents with  . Cholelithiasis   (Consider location/radiation/quality/duration/timing/severity/associated sxs/prior Treatment) HPI [name] is a 57 y.o. female who presents to emergency department complaint of abdominal pain. Patient states that she has had abdominal pain for 2 months now. Patient states the pain comes and goes. Patient reports that pain is worse after EEG and when she is bending over. States pain is diffuse and radiates into the back and right shoulder. States that she was seen for this at any pain hospital a few weeks ago and was diagnosed with gallstones. She was told that she needed to come back if her pain worsens. Patient states that her pain is gradually worsening every day. Patient states that she is not taking any medications for this. States that she also has hiatal hernia and supposed to be on Prilosec but she is not taking it. Patient denies any fever, chills. She denies any chest pain. She denies any shortness of breath. Patient states that she has had nausea and vomiting in the last few days. Patient states she vomited twice after eating yesterday. Patient states that she has normal bowel movements. She denies any pain with urination. She did not take any medications prior to coming in. Past Medical History  Diagnosis Date  . Chest pain   . Hiatal hernia   . SOB (shortness of breath)   . Palpitations   . PND (paroxysmal nocturnal dyspnea)   . Anxiety    Past Surgical History  Procedure Laterality Date  . Appendectomy    . Carpel tunnel     Family History  Problem Relation Age of Onset  . Heart attack Mother    History  Substance Use Topics  . Smoking status: Never Smoker   . Smokeless tobacco: Not on file  . Alcohol Use: No   OB History   Grav Para Term Preterm Abortions TAB SAB Ect Mult Living               Review of Systems  Constitutional: Negative for fever and chills.  Respiratory: Negative for cough, chest tightness and shortness of breath.   Cardiovascular: Negative for chest pain, palpitations and leg swelling.  Gastrointestinal: Positive for nausea, vomiting and abdominal pain. Negative for diarrhea.  Genitourinary: Negative for dysuria, flank pain, vaginal bleeding, vaginal discharge, vaginal pain and pelvic pain.  Musculoskeletal: Negative for myalgias and arthralgias.  Skin: Negative for rash.  Neurological: Negative for dizziness, weakness and headaches.  All other systems reviewed and are negative.    Allergies  Review of patient's allergies indicates no known allergies.  Home Medications   Current Outpatient Rx  Name  Route  Sig  Dispense  Refill  . ALPRAZolam (XANAX) 1 MG tablet   Oral   Take 2 mg by mouth 3 (three) times daily as needed for anxiety.          . Aspirin-Acetaminophen (GOODYS BODY PAIN PO)   Oral   Take 1 Package by mouth daily as needed (pain).         Marland Kitchen lamoTRIgine (LAMICTAL) 200 MG tablet   Oral   Take 200 mg by mouth 3 (three) times daily.         Marland Kitchen lisinopril (PRINIVIL,ZESTRIL) 20 MG tablet   Oral   Take 20 mg by mouth daily.         Marland Kitchen  meloxicam (MOBIC) 15 MG tablet   Oral   Take 15 mg by mouth daily.         Marland Kitchen omeprazole (PRILOSEC) 20 MG capsule   Oral   Take 1 capsule (20 mg total) by mouth daily.   30 capsule   0   . simvastatin (ZOCOR) 20 MG tablet   Oral   Take 20 mg by mouth every evening.          BP 142/89  Pulse 80  Temp(Src) 98.4 F (36.9 C) (Oral)  Resp 18  SpO2 97% Physical Exam  Nursing note and vitals reviewed. Constitutional: She appears well-developed and well-nourished. No distress.  HENT:  Head: Normocephalic.  Eyes: Conjunctivae are normal.  Neck: Neck supple.  Cardiovascular: Normal rate, regular rhythm and normal heart sounds.   Pulmonary/Chest: Effort normal and breath  sounds normal. No respiratory distress. She has no wheezes. She has no rales.  Abdominal: Soft. Bowel sounds are normal. She exhibits no distension. There is tenderness. There is no rebound.  RUQ and LUQ tenderness. RUQ guarding.   Musculoskeletal: She exhibits no edema.  Neurological: She is alert.  Skin: Skin is warm and dry.  Psychiatric: She has a normal mood and affect. Her behavior is normal.    ED Course  Procedures (including critical care time)   Date: 01/17/2013  Rate: 73  Rhythm: normal sinus rhythm  QRS Axis: normal  Intervals: normal  ST/T Wave abnormalities: normal  Conduction Disutrbances:none  Narrative Interpretation:   Old EKG Reviewed: unchanged   Results for orders placed during the hospital encounter of 01/17/13  CBC WITH DIFFERENTIAL      Result Value Range   WBC 4.9  4.0 - 10.5 K/uL   RBC 3.65 (*) 3.87 - 5.11 MIL/uL   Hemoglobin 11.2 (*) 12.0 - 15.0 g/dL   HCT 16.1 (*) 09.6 - 04.5 %   MCV 89.3  78.0 - 100.0 fL   MCH 30.7  26.0 - 34.0 pg   MCHC 34.4  30.0 - 36.0 g/dL   RDW 40.9  81.1 - 91.4 %   Platelets 191  150 - 400 K/uL   Neutrophils Relative % 50  43 - 77 %   Neutro Abs 2.5  1.7 - 7.7 K/uL   Lymphocytes Relative 40  12 - 46 %   Lymphs Abs 2.0  0.7 - 4.0 K/uL   Monocytes Relative 8  3 - 12 %   Monocytes Absolute 0.4  0.1 - 1.0 K/uL   Eosinophils Relative 2  0 - 5 %   Eosinophils Absolute 0.1  0.0 - 0.7 K/uL   Basophils Relative 1  0 - 1 %   Basophils Absolute 0.0  0.0 - 0.1 K/uL  COMPREHENSIVE METABOLIC PANEL      Result Value Range   Sodium 139  135 - 145 mEq/L   Potassium 3.5  3.5 - 5.1 mEq/L   Chloride 103  96 - 112 mEq/L   CO2 26  19 - 32 mEq/L   Glucose, Bld 83  70 - 99 mg/dL   BUN 18  6 - 23 mg/dL   Creatinine, Ser 7.82  0.50 - 1.10 mg/dL   Calcium 9.5  8.4 - 95.6 mg/dL   Total Protein 7.1  6.0 - 8.3 g/dL   Albumin 3.9  3.5 - 5.2 g/dL   AST 20  0 - 37 U/L   ALT 17  0 - 35 U/L   Alkaline Phosphatase 80  39 -  117 U/L   Total  Bilirubin 0.2 (*) 0.3 - 1.2 mg/dL   GFR calc non Af Amer >90  >90 mL/min   GFR calc Af Amer >90  >90 mL/min  LIPASE, BLOOD      Result Value Range   Lipase 47  11 - 59 U/L   No results found.    MDM   1. Cholelithiases     Patient with diffuse abdominal pain on the exam localized to the right upper quadrant. Had ultrasound done 2 weeks ago which showed cholelithiasis. Today she does not have elevated white blood count. Her LFTs and lipase are normal. Her pain is improved with morphine in emergency department. I called general surgery for consult and given her white count, LFTs, lipase are normal her pain is well under control all outpatient in the office for elective colecystectomy.  I discussed the plan with patient. She is agreeable to the plan. I will discharge her home with pain medications, will restart her on Prilosec, she will call the Gen. surgery office today.  Filed Vitals:   01/17/13 0930 01/17/13 0945 01/17/13 1000 01/17/13 1030  BP: 128/88 130/75 140/90 126/89  Pulse: 68 67 64 70  Temp:      TempSrc:      Resp: 16 18 14 13   SpO2: 100% 100% 100% 100%      Lottie Mussel, PA-C 01/17/13 1605

## 2013-01-18 ENCOUNTER — Telehealth (INDEPENDENT_AMBULATORY_CARE_PROVIDER_SITE_OTHER): Payer: Self-pay | Admitting: General Surgery

## 2013-01-18 ENCOUNTER — Ambulatory Visit (INDEPENDENT_AMBULATORY_CARE_PROVIDER_SITE_OTHER): Payer: Medicaid Other | Admitting: General Surgery

## 2013-01-18 NOTE — Telephone Encounter (Signed)
LMOM 01/18/13@2 :55 checking to see why patient no showed appt today

## 2013-01-19 NOTE — ED Provider Notes (Signed)
Medical screening examination/treatment/procedure(s) were performed by non-physician practitioner and as supervising physician I was immediately available for consultation/collaboration.   Gwyneth Sprout, MD 01/19/13 [phone]

## 2013-01-24 ENCOUNTER — Encounter (HOSPITAL_COMMUNITY): Payer: Self-pay | Admitting: *Deleted

## 2013-01-24 ENCOUNTER — Emergency Department (HOSPITAL_COMMUNITY)
Admission: EM | Admit: 2013-01-24 | Discharge: 2013-01-24 | Disposition: A | Discharge status: Home / Self Care | Payer: Medicaid Other | Attending: Emergency Medicine | Admitting: Emergency Medicine

## 2013-01-24 DIAGNOSIS — R109 Unspecified abdominal pain: Secondary | ICD-10-CM

## 2013-01-24 DIAGNOSIS — Z791 Long term (current) use of non-steroidal anti-inflammatories (NSAID): Secondary | ICD-10-CM | POA: Insufficient documentation

## 2013-01-24 DIAGNOSIS — K8020 Calculus of gallbladder without cholecystitis without obstruction: Secondary | ICD-10-CM | POA: Insufficient documentation

## 2013-01-24 DIAGNOSIS — Z79899 Other long term (current) drug therapy: Secondary | ICD-10-CM | POA: Insufficient documentation

## 2013-01-24 DIAGNOSIS — Z8719 Personal history of other diseases of the digestive system: Secondary | ICD-10-CM | POA: Insufficient documentation

## 2013-01-24 DIAGNOSIS — Z9089 Acquired absence of other organs: Secondary | ICD-10-CM | POA: Insufficient documentation

## 2013-01-24 DIAGNOSIS — F411 Generalized anxiety disorder: Secondary | ICD-10-CM | POA: Insufficient documentation

## 2013-01-24 LAB — CBC WITH DIFFERENTIAL/PLATELET
Basophils Relative: 1 % (ref 0–1)
HCT: 34.8 % — ABNORMAL LOW (ref 36.0–46.0)
Hemoglobin: 11.8 g/dL — ABNORMAL LOW (ref 12.0–15.0)
Lymphocytes Relative: 34 % (ref 12–46)
Lymphs Abs: 1.6 10*3/uL (ref 0.7–4.0)
Monocytes Relative: 8 % (ref 3–12)
Neutro Abs: 2.6 10*3/uL (ref 1.7–7.7)
Neutrophils Relative %: 55 % (ref 43–77)
RBC: 3.88 MIL/uL (ref 3.87–5.11)

## 2013-01-24 LAB — COMPREHENSIVE METABOLIC PANEL
CO2: 28 mEq/L (ref 19–32)
Calcium: 9.6 mg/dL (ref 8.4–10.5)
Creatinine, Ser: 0.77 mg/dL (ref 0.50–1.10)
GFR calc Af Amer: >90 mL/min (ref >90)
GFR calc non Af Amer: >90 mL/min (ref >90)
Glucose, Bld: 78 mg/dL (ref 70–99)
Total Protein: 7.7 g/dL (ref 6.0–8.3)

## 2013-01-24 MED ORDER — HYDROMORPHONE HCL PF 1 MG/ML IJ SOLN
1.0000 mg | Freq: Once | INTRAMUSCULAR | Status: AC
  Administered 2013-01-24: 1 mg via INTRAVENOUS
  Filled 2013-01-24: qty 1

## 2013-01-24 MED ORDER — OXYCODONE-ACETAMINOPHEN 5-325 MG PO TABS: 2.0000 | ORAL_TABLET | ORAL | Status: DC | PRN

## 2013-01-24 MED ORDER — PROMETHAZINE HCL 25 MG PO TABS: 25.0000 mg | ORAL_TABLET | Freq: Four times a day (QID) | ORAL | Status: DC | PRN

## 2013-01-24 MED ORDER — ONDANSETRON HCL 4 MG/2ML IJ SOLN
4.0000 mg | Freq: Once | INTRAMUSCULAR | Status: AC
  Administered 2013-01-24: 4 mg via INTRAVENOUS
  Filled 2013-01-24: qty 2

## 2013-01-24 MED ORDER — SODIUM CHLORIDE 0.9 % IV BOLUS (SEPSIS)
1000.0000 mL | Freq: Once | INTRAVENOUS | Status: AC
  Administered 2013-01-24: 1000 mL via INTRAVENOUS

## 2013-01-24 NOTE — ED Provider Notes (Signed)
CSN: [id number]     Arrival date & time 01/24/13  1255 History   First MD Initiated Contact with Patient 01/24/13 1409     Chief Complaint  Patient presents with  . Abdominal Pain   (Consider location/radiation/quality/duration/timing/severity/associated sxs/prior Treatment) HPI...Marland KitchenMarland KitchenMarland Kitchen right upper quadrant pain for 2 weeks. Ultrasound on 12/04/2012 reveals multiple gallstones without cholecystitis. Pain has persisted. No fever, sweats, chills. Decreased oral intake.  Pain radiates to mid back. Severity is moderate.  Past Medical History  Diagnosis Date  . Chest pain   . Hiatal hernia   . SOB (shortness of breath)   . Palpitations   . PND (paroxysmal nocturnal dyspnea)   . Anxiety    Past Surgical History  Procedure Laterality Date  . Appendectomy    . Carpel tunnel    . Carpal tunnel release     Family History  Problem Relation Age of Onset  . Heart attack Mother    History  Substance Use Topics  . Smoking status: Never Smoker   . Smokeless tobacco: Not on file  . Alcohol Use: No   OB History   Grav Para Term Preterm Abortions TAB SAB Ect Mult Living                 Review of Systems  All other systems reviewed and are negative.    Allergies  Morphine and related  Home Medications   Current Outpatient Rx  Name  Route  Sig  Dispense  Refill  . ALPRAZolam (XANAX) 1 MG tablet   Oral   Take 2 mg by mouth 3 (three) times daily as needed for anxiety.          . Aspirin-Acetaminophen (GOODYS BODY PAIN PO)   Oral   Take 1 Package by mouth daily as needed (pain).         Marland Kitchen lamoTRIgine (LAMICTAL) 200 MG tablet   Oral   Take 200 mg by mouth 3 (three) times daily.         Marland Kitchen lisinopril (PRINIVIL,ZESTRIL) 20 MG tablet   Oral   Take 20 mg by mouth daily.         . meloxicam (MOBIC) 15 MG tablet   Oral   Take 15 mg by mouth daily.         Marland Kitchen omeprazole (PRILOSEC) 20 MG capsule   Oral   Take 1 capsule (20 mg total) by mouth daily.   30 capsule   0    . ondansetron (ZOFRAN ODT) 8 MG disintegrating tablet   Oral   Take 1 tablet (8 mg total) by mouth every 8 (eight) hours as needed for nausea.   10 tablet   0   . oxyCODONE-acetaminophen (PERCOCET) 5-325 MG per tablet   Oral   Take 1 tablet by mouth every 4 (four) hours as needed for pain.   20 tablet   0   . simvastatin (ZOCOR) 20 MG tablet   Oral   Take 20 mg by mouth every evening.         Marland Kitchen oxyCODONE-acetaminophen (PERCOCET) 5-325 MG per tablet   Oral   Take 2 tablets by mouth every 4 (four) hours as needed for pain.   20 tablet   0   . oxyCODONE-acetaminophen (PERCOCET) 5-325 MG per tablet   Oral   Take 2 tablets by mouth every 4 (four) hours as needed for pain.   30 tablet   0   . promethazine (PHENERGAN) 25 MG tablet  Oral   Take 1 tablet (25 mg total) by mouth every 6 (six) hours as needed for nausea.   20 tablet   0   . promethazine (PHENERGAN) 25 MG tablet   Oral   Take 1 tablet (25 mg total) by mouth every 6 (six) hours as needed for nausea.   20 tablet   0    BP 107/71  Pulse 83  Temp(Src) 97.9 F (36.6 C) (Oral)  Resp 18  SpO2 100% Physical Exam  Nursing note and vitals reviewed. Constitutional: She is oriented to person, place, and time. She appears well-developed and well-nourished.  HENT:  Head: Normocephalic and atraumatic.  Eyes: Conjunctivae and EOM are normal. Pupils are equal, round, and reactive to light.  Neck: Normal range of motion. Neck supple.  Cardiovascular: Normal rate, regular rhythm and normal heart sounds.   Pulmonary/Chest: Effort normal and breath sounds normal.  Abdominal: Soft. Bowel sounds are normal.  Minimal right upper quadrant tenderness  Musculoskeletal: Normal range of motion.  Neurological: She is alert and oriented to person, place, and time.  Skin: Skin is warm and dry.  Psychiatric: She has a normal mood and affect.    ED Course  Procedures (including critical care time) Labs Review Labs Reviewed   CBC WITH DIFFERENTIAL - Abnormal; Notable for the following:    Hemoglobin 11.8 (*)    HCT 34.8 (*)    All other components within normal limits  COMPREHENSIVE METABOLIC PANEL  LIPASE, BLOOD   Imaging Review No results found.  MDM   1. Abdominal pain    Patient is hemodynamically stable. She has known cholelithiasis. White count and liver functions are normal. General surgery followup on Monday 01/28/2013. Discharge meds Percocet and Phenergan 25 mg    Donnetta Hutching, MD 01/24/13 1736

## 2013-01-24 NOTE — ED Notes (Signed)
Pt began having upper right sided pain about 2wks ago. Pt states she was supposed to have a surgery consult on Monday to see about getting gallbladder removed. Pt states she called RN at pcp and was told by them if she was having increased pain and was told to come here to ED. Pt rates pain 10/10.

## 2013-01-24 NOTE — ED Notes (Signed)
This coming Monday pt is supposed to have pre-op appt for her cholecystectomy.  However, her pain is becoming unbearable.  She called the surgical center and they told her to come to the ED.

## 2013-01-28 ENCOUNTER — Ambulatory Visit (INDEPENDENT_AMBULATORY_CARE_PROVIDER_SITE_OTHER): Payer: Medicaid Other | Admitting: General Surgery

## 2013-01-28 ENCOUNTER — Encounter (INDEPENDENT_AMBULATORY_CARE_PROVIDER_SITE_OTHER): Payer: Self-pay | Admitting: General Surgery

## 2013-01-28 ENCOUNTER — Telehealth (INDEPENDENT_AMBULATORY_CARE_PROVIDER_SITE_OTHER): Payer: Self-pay | Admitting: General Surgery

## 2013-01-28 VITALS — BP 120/88 | HR 108 | Temp 97.8°F | Ht 62.0 in | Wt 141.4 lb

## 2013-01-28 DIAGNOSIS — K8020 Calculus of gallbladder without cholecystitis without obstruction: Secondary | ICD-10-CM

## 2013-01-28 NOTE — Telephone Encounter (Signed)
LMOM 9/22@3 :13 asking patient to return my call...per Ladonna Snide over at [facility] [phone] stated that per medicaid card her PCP has to call and refer the pt to them...we cannot refer due to the name of the doctor on the card.Marland KitchenMarland KitchenAdolph Pollack not accepting new patients so Deboraha Sprang would be who she needs to be referred to and pretty urgently within the next week if possible or two per AR

## 2013-01-28 NOTE — Addendum Note (Signed)
Addended by: June Leap on: 01/28/2013 01:52 PM   Modules accepted: Orders

## 2013-01-28 NOTE — Progress Notes (Signed)
Subjective:     Patient ID: [name], female   DOB: [birth year], 57 y.o.   MRN: [id number]  HPI The patient is a 57 year old female who is referred here secondary to abdominal pain. The patient was seen recently in the ER secondary to epigastric and right upper quadrant pain. Patient underwent ultrasound which revealed gallstones was no signs of cholecystitis. Patient's LFTs were within normal limits at that time.  Patient is a history of epigastric pain with coffee-ground emesis and melanic stools.  Patient states that the pain is not associated with food. She states that the pain is there throughout the day mainly in the epigastrium which radiates to the back.  Review of Systems  Constitutional: Negative.   HENT: Negative.   Respiratory: Negative.   Cardiovascular: Negative.   Gastrointestinal: Negative.   Neurological: Negative.   All other systems reviewed and are negative.       Objective:   Physical Exam  Constitutional: She is oriented to person, place, and time. She appears well-developed and well-nourished.  HENT:  Head: Normocephalic and atraumatic.  Eyes: Conjunctivae and EOM are normal. Pupils are equal, round, and reactive to light.  Neck: Normal range of motion. Neck supple.  Cardiovascular: Normal rate, regular rhythm and normal heart sounds.   Pulmonary/Chest: Effort normal and breath sounds normal.  Abdominal: Soft. Bowel sounds are normal. There is tenderness (ruq). There is no rebound and no guarding.  Musculoskeletal: Normal range of motion.  Neurological: She is alert and oriented to person, place, and time.  Skin: Skin is warm and dry.       Assessment:     57 year old female with coffee-ground emesis and melenic stools. Her family that these symptoms are secondary to any biliary colic however can be associated with either gastritis, gastric ulcer, or duodenal ulcer.    Plan:     1. We will have the patient referred to GI for workup and likely  endoscopy. Should this workup be negative I be happy to the patient back for counseling of a laparoscopic cholecystectomy.

## 2013-01-28 NOTE — Telephone Encounter (Signed)
Patient returned my call 9/22@3 :30 and I let her know that the PCP listed on her card would have to call and make her GI appt..patient verbalized agreement with POC at this time

## 2013-02-01 ENCOUNTER — Encounter (HOSPITAL_COMMUNITY): Payer: Self-pay

## 2013-02-01 ENCOUNTER — Emergency Department (HOSPITAL_COMMUNITY): Payer: Medicaid Other

## 2013-02-01 ENCOUNTER — Emergency Department (HOSPITAL_COMMUNITY)
Admission: EM | Admit: 2013-02-01 | Discharge: 2013-02-01 | Disposition: A | Discharge status: Home / Self Care | Payer: Medicaid Other | Attending: Emergency Medicine | Admitting: Emergency Medicine

## 2013-02-01 DIAGNOSIS — I10 Essential (primary) hypertension: Secondary | ICD-10-CM | POA: Insufficient documentation

## 2013-02-01 DIAGNOSIS — F329 Major depressive disorder, single episode, unspecified: Secondary | ICD-10-CM | POA: Insufficient documentation

## 2013-02-01 DIAGNOSIS — F3289 Other specified depressive episodes: Secondary | ICD-10-CM | POA: Insufficient documentation

## 2013-02-01 DIAGNOSIS — Z9089 Acquired absence of other organs: Secondary | ICD-10-CM | POA: Insufficient documentation

## 2013-02-01 DIAGNOSIS — F411 Generalized anxiety disorder: Secondary | ICD-10-CM | POA: Insufficient documentation

## 2013-02-01 DIAGNOSIS — E7800 Pure hypercholesterolemia, unspecified: Secondary | ICD-10-CM | POA: Insufficient documentation

## 2013-02-01 DIAGNOSIS — K2970 Gastritis, unspecified, without bleeding: Secondary | ICD-10-CM | POA: Insufficient documentation

## 2013-02-01 DIAGNOSIS — Z79899 Other long term (current) drug therapy: Secondary | ICD-10-CM | POA: Insufficient documentation

## 2013-02-01 DIAGNOSIS — R109 Unspecified abdominal pain: Secondary | ICD-10-CM

## 2013-02-01 LAB — COMPREHENSIVE METABOLIC PANEL
ALT: 27 U/L (ref 0–35)
Alkaline Phosphatase: 97 U/L (ref 39–117)
CO2: 26 mEq/L (ref 19–32)
Calcium: 10.4 mg/dL (ref 8.4–10.5)
GFR calc Af Amer: 89 mL/min — ABNORMAL LOW (ref >90)
GFR calc non Af Amer: 77 mL/min — ABNORMAL LOW (ref >90)
Glucose, Bld: 98 mg/dL (ref 70–99)
Sodium: 136 mEq/L (ref 135–145)

## 2013-02-01 LAB — CBC WITH DIFFERENTIAL/PLATELET
Eosinophils Relative: 2 % (ref 0–5)
HCT: 40.4 % (ref 36.0–46.0)
Hemoglobin: 13.8 g/dL (ref 12.0–15.0)
Lymphocytes Relative: 23 % (ref 12–46)
Lymphs Abs: 1.5 10*3/uL (ref 0.7–4.0)
MCV: 89.2 fL (ref 78.0–100.0)
Monocytes Relative: 7 % (ref 3–12)
Platelets: 242 10*3/uL (ref 150–400)
RBC: 4.53 MIL/uL (ref 3.87–5.11)
WBC: 6.6 10*3/uL (ref 4.0–10.5)

## 2013-02-01 LAB — URINALYSIS, ROUTINE W REFLEX MICROSCOPIC
Bilirubin Urine: NEGATIVE
Hgb urine dipstick: NEGATIVE
Protein, ur: NEGATIVE mg/dL
Urobilinogen, UA: 0.2 mg/dL (ref 0.0–1.0)

## 2013-02-01 LAB — POCT I-STAT TROPONIN I: POC Troponin I: 0.03

## 2013-02-01 LAB — POCT I-STAT, CHEM 8
BUN: 13 mg/dL (ref 6–23)
Creatinine, Ser: 0.9 mg/dL (ref 0.50–1.10)
Glucose, Bld: 86 mg/dL (ref 70–99)
Potassium: 3.9 mEq/L (ref 3.5–5.1)
Sodium: 138 mEq/L (ref 135–145)
TCO2: 26 mmol/L (ref 0–100)

## 2013-02-01 LAB — CG4 I-STAT (LACTIC ACID): Lactic Acid, Venous: 1.98 mmol/L (ref 0.5–2.2)

## 2013-02-01 MED ORDER — OMEPRAZOLE 20 MG PO CPDR: 20.0000 mg | DELAYED_RELEASE_CAPSULE | Freq: Every day | ORAL | Status: DC

## 2013-02-01 MED ORDER — GI COCKTAIL ~~LOC~~
30.0000 mL | Freq: Once | ORAL | Status: AC
  Administered 2013-02-01: 30 mL via ORAL
  Filled 2013-02-01: qty 30

## 2013-02-01 MED ORDER — SODIUM CHLORIDE 0.9 % IV BOLUS (SEPSIS)
1000.0000 mL | Freq: Once | INTRAVENOUS | Status: AC
  Administered 2013-02-01: 1000 mL via INTRAVENOUS

## 2013-02-01 MED ORDER — HYDROMORPHONE HCL PF 1 MG/ML IJ SOLN
1.0000 mg | Freq: Once | INTRAMUSCULAR | Status: AC
  Administered 2013-02-01: 1 mg via INTRAVENOUS
  Filled 2013-02-01: qty 1

## 2013-02-01 MED ORDER — IOHEXOL 300 MG/ML  SOLN
50.0000 mL | Freq: Once | INTRAMUSCULAR | Status: AC | PRN
  Administered 2013-02-01: 50 mL via ORAL

## 2013-02-01 MED ORDER — GI COCKTAIL ~~LOC~~: 30.0000 mL | Freq: Once | ORAL | Status: AC

## 2013-02-01 MED ORDER — ONDANSETRON HCL 4 MG/2ML IJ SOLN
4.0000 mg | Freq: Once | INTRAMUSCULAR | Status: AC
  Administered 2013-02-01: 4 mg via INTRAVENOUS
  Filled 2013-02-01: qty 2

## 2013-02-01 MED ORDER — ONDANSETRON HCL 4 MG PO TABS: 4.0000 mg | ORAL_TABLET | Freq: Four times a day (QID) | ORAL | Status: DC

## 2013-02-01 MED ORDER — PANTOPRAZOLE SODIUM 40 MG IV SOLR
40.0000 mg | Freq: Once | INTRAVENOUS | Status: AC
  Administered 2013-02-01: 40 mg via INTRAVENOUS
  Filled 2013-02-01: qty 40

## 2013-02-01 MED ORDER — IOHEXOL 300 MG/ML  SOLN
100.0000 mL | Freq: Once | INTRAMUSCULAR | Status: AC | PRN
  Administered 2013-02-01: 100 mL via INTRAVENOUS

## 2013-02-01 NOTE — ED Notes (Signed)
Pt reports severe right upper quad ab pain for the last month, +nausea, no vomiting, +diarrhea at times, ?fever. Has known gallstones.

## 2013-02-01 NOTE — ED Provider Notes (Signed)
CSN: [id number]     Arrival date & time 02/01/13  1441 History   This chart was scribed for Glynn Octave, MD by Bennett Scrape, ED Scribe. This patient was seen in room APA05/APA05 and the patient's care was started at 3:03 PM.   Chief Complaint  Patient presents with  . Abdominal Pain    The history is provided by the patient. No language interpreter was used.   HPI Comments: [name] is a 57 y.o. female who presents to the Emergency Department complaining of diffuse upper abdominal pain that radiates to the back that she attributes to gallbladder pain. She states that the pain has been constant and gradually worsening for the past 4 weeks and she reports that movement and eating aggravate the pain. She lists nausea as an associated symptom. She also reports one episode of suspected melena described as black stool and hematemesis described as "black" vomit yesterday. She states that she has been taking suppository pain pills and protonix with no improvement. She has been seen in the ED for the same several times. Her last ultrasound on 12/04/2012 that showed multiple gallstones without cholecystitis. She states that she was seen by a surgeon last week for the same and was told that it was an ulcer, but not the gallbladder. She was advised to follow up with GI referral for an endoscopy, but pt denies that she has followed up.  She also states [facility] rectal and guaiac  exam. She states that she has been having less than normal BMs but cannot explain further. At baseline, she has one daily. She has a h/o hiatal hernia and appendectomy. She denies alcohol use.   [facility]  Past Medical History  Diagnosis Date  . Chest pain   . Hiatal hernia   . SOB (shortness of breath)   . Palpitations   . PND (paroxysmal nocturnal dyspnea)   . Anxiety    Past Surgical History  Procedure Laterality Date  .  Appendectomy    . Carpel tunnel    . Carpal tunnel release     Family History  Problem Relation Age of Onset  . Heart attack Mother    History  Substance Use Topics  . Smoking status: Never Smoker   . Smokeless tobacco: Not on file  . Alcohol Use: No   No OB history provided.  Review of Systems  A complete 10 system review of systems was obtained and all systems are negative except as noted in the HPI and PMH.   Allergies  Morphine and related-severe nausea and emesis per pt at bedside.  Home Medications   Current Outpatient Rx  Name  Route  Sig  Dispense  Refill  . ALPRAZolam (XANAX) 1 MG tablet   Oral   Take 0.5-1 mg by mouth 3 (three) times daily as needed for anxiety.          Marland Kitchen amphetamine-dextroamphetamine (ADDERALL) 20 MG tablet   Oral   Take 10-20 mg by mouth daily.         . Diphenhydramine-APAP, sleep, (GOODY PM PO)   Oral   Take 1 packet by mouth daily as needed (for pain).         Marland Kitchen lamoTRIgine (LAMICTAL) 200 MG tablet   Oral   Take 200 mg by mouth 3 (three) times daily.         Marland Kitchen lisinopril (PRINIVIL,ZESTRIL) 20 MG tablet  Oral   Take 20 mg by mouth daily.         Marland Kitchen omeprazole (PRILOSEC) 20 MG capsule   Oral   Take 1 capsule (20 mg total) by mouth daily.   30 capsule   0   . oxyCODONE-acetaminophen (PERCOCET) 5-325 MG per tablet   Oral   Take 2 tablets by mouth every 4 (four) hours as needed for pain.   30 tablet   0   . promethazine (PHENERGAN) 25 MG tablet   Oral   Take 1 tablet (25 mg total) by mouth every 6 (six) hours as needed for nausea.   20 tablet   0   . simvastatin (ZOCOR) 20 MG tablet   Oral   Take 20 mg by mouth every evening.         Marland Kitchen omeprazole (PRILOSEC) 20 MG capsule   Oral   Take 1 capsule (20 mg total) by mouth daily.   30 capsule   0   . ondansetron (ZOFRAN) 4 MG tablet   Oral   Take 1 tablet (4 mg total) by mouth every 6 (six) hours.   12 tablet   0    Triage Vitals: BP 125/98  Pulse 94   Temp(Src) 97.4 F (36.3 C) (Oral)  Resp 20  Ht 5\' 2"  (1.575 m)  Wt 141 lb (63.957 kg)  BMI 25.78 kg/m2  SpO2 98%  Physical Exam  Nursing note and vitals reviewed. Constitutional: She is oriented to person, place, and time. She appears well-developed and well-nourished. No distress.  HENT:  Head: Normocephalic and atraumatic.  Eyes: Conjunctivae and EOM are normal.  Neck: Normal range of motion. Neck supple. No tracheal deviation present.  Cardiovascular: Normal rate, regular rhythm and normal heart sounds.   No murmur heard. Pulmonary/Chest: Effort normal and breath sounds normal. No respiratory distress. She has no wheezes. She has no rales.  Abdominal: Soft. Bowel sounds are normal. There is tenderness (diffuse tenderness, RUQ and epigastrium are worse). There is guarding (RUQ). There is no rebound.  No CVA tenderness   Genitourinary: Guaiac negative stool.  Chaperone present, small external hemorrhoids, no fissures, brown stool present in the rectal vault  Musculoskeletal: Normal range of motion. She exhibits no edema.  Neurological: She is alert and oriented to person, place, and time. No cranial nerve deficit.  Skin: Skin is warm and dry.  Psychiatric: She has a normal mood and affect. Her behavior is normal.    ED Course  Procedures (including critical care time)  Medications  HYDROmorphone (DILAUDID) injection 1 mg (1 mg Intravenous Given 02/01/13 1522)  ondansetron (ZOFRAN) injection 4 mg (4 mg Intravenous Given 02/01/13 1522)  sodium chloride 0.9 % bolus 1,000 mL (0 mLs Intravenous Stopped 02/01/13 1818)  pantoprazole (PROTONIX) injection 40 mg (40 mg Intravenous Given 02/01/13 1522)  iohexol (OMNIPAQUE) 300 MG/ML solution 50 mL (50 mLs Oral Contrast Given 02/01/13 1530)  iohexol (OMNIPAQUE) 300 MG/ML solution 100 mL (100 mLs Intravenous Contrast Given 02/01/13 1619)  gi cocktail (Maalox,Lidocaine,Donnatal) (30 mLs Oral Given 02/01/13 1719)  gi cocktail  (Maalox,Lidocaine,Donnatal) (0 mLs Oral Duplicate 02/01/13 1720)    DIAGNOSTIC STUDIES: Oxygen Saturation is 98% on room air, normal by my interpretation.    COORDINATION OF CARE: 3:09 PM-Discussed treatment plan which includes medications, CT of abdomen, CBC panel, CMP and UA with pt at bedside and pt agreed to plan.   Labs Review Labs Reviewed  COMPREHENSIVE METABOLIC PANEL - Abnormal; Notable for the following:  Total Protein 8.4 (*)    AST 41 (*)    GFR calc non Af Amer 77 (*)    GFR calc Af Amer 89 (*)    All other components within normal limits  URINALYSIS, ROUTINE W REFLEX MICROSCOPIC - Abnormal; Notable for the following:    APPearance HAZY (*)    All other components within normal limits  POCT I-STAT TROPONIN I - Normal  CBC WITH DIFFERENTIAL  LIPASE, BLOOD  POCT I-STAT, CHEM 8  CG4 I-STAT (LACTIC ACID)   Imaging Review Ct Abdomen Pelvis W Contrast  02/01/2013   CLINICAL DATA:  Abdominal pain  EXAM: CT ABDOMEN AND PELVIS WITH CONTRAST  TECHNIQUE: Multidetector CT imaging of the abdomen and pelvis was performed using the standard protocol following bolus administration of intravenous contrast.  CONTRAST:  50mL OMNIPAQUE IOHEXOL 300 MG/ML SOLN, OMNIPAQUE IOHEXOL 300 MG/ML SOLN  COMPARISON:  None  FINDINGS: No pleural or pericardial effusion identified. The lung bases appear clear.  Cyst is identified within the left hepatic lobe measuring 4.1 cm, image 23/series 2. The gallbladder appears normal. No biliary dilatation identified. Normal appearance of the pancreas. This spleen is negative. Normal appearance of the spleen.  The adrenal glands both appear normal. Several small hypodense structures in the right kidney are too small to reliably characterize. These likely represent small cysts. The left kidney appears normal.  The urinary bladder appears within normal limits. The uterus and adnexal structures are on unremarkable.  The stomach appears within normal limits. The  small bowel loops have a normal course and caliber without evidence for bowel obstruction. Normal appearance of the colon.  No free fluid or abnormal fluid collections within the abdomen or the pelvis.  The abdominal aorta has a normal caliber without aneurysm. There is no upper abdominal adenopathy. There is no pelvic or inguinal adenopathy identified.  Review of the visualized osseous structures is significant for mild scoliosis and multilevel degenerative disc disease.  IMPRESSION: 1. No acute findings within the abdomen or pelvis.  2.  Liver cyst   Electronically Signed   By: Signa Kell M.D.   On: 02/01/2013 16:48   US Abdomen Limited Ruq  02/01/2013   *RADIOLOGY REPORT*  Clinical Data:  56 year old female with right upper abdominal pain and vomiting.  LIMITED ABDOMINAL ULTRASOUND - RIGHT UPPER QUADRANT  Comparison:  12/04/2012 ultrasound  Findings:  Gallbladder:  Multiple mobile gallstones are identified, all measuring 3-5 mm.  There is no evidence of gallbladder wall thickening, pericholecystic fluid or sonographic Murphy's sign.  Common bile duct:  There is no evidence of intrahepatic or extrahepatic biliary dilatation.  The visualized CBD is unremarkable measuring 6.2 mm in diameter.  Liver:  A 3.3 x 4.4 cm left hepatic cyst is again identified and unchanged. No other hepatic abnormalities are identified.  There is no evidence of free fluid within the right upper abdomen.  IMPRESSION: No acute abnormalities.  Cholelithiasis without evidence of acute cholecystitis.  Unchanged hepatic cyst.                    Original Report Authenticated By: Harmon Pier, M.D.    MDM   1. Gastritis   2. Abdominal pain    2 month history of diffuse abdominal pain, with nausea and decreased appetite. Had [place] which showed gallstones.  Saw surgery on 9/22 who did not think gallbladder was issue and recommended EGD.  Right upper quadrant and epigastric tenderness to palpation. No peritoneal signs. LFTs normal.  Lipase normal.  Patient's pain is likely from gastritis, esophagitis or ulcer disease. Normal white count. CT scan shows no acute pathology. She has cholelithiasis without evidence of cholecystitis. She's a surgery last week who thought her symptoms were more likely related to PUD versus gastritis. She'll need follow up with GI. We'll start PPI.  I personally performed the services described in this documentation, which was scribed in my presence.  The recorded information has been reviewed and considered.    Glynn Octave, MD 02/01/13 [phone]

## 2013-02-01 NOTE — ED Notes (Signed)
Patient with no complaints at this time. Respirations even and unlabored. Skin warm/dry. Discharge instructions reviewed with patient at this time. Patient given opportunity to voice concerns/ask questions. IV removed per policy and band-aid applied to site. Patient discharged at this time and left Emergency Department with steady gait.  

## 2013-02-04 LAB — OCCULT BLOOD, POC DEVICE: Fecal Occult Bld: NEGATIVE

## 2013-02-05 ENCOUNTER — Encounter: Payer: Self-pay | Admitting: Gastroenterology

## 2013-02-05 ENCOUNTER — Ambulatory Visit (INDEPENDENT_AMBULATORY_CARE_PROVIDER_SITE_OTHER): Payer: Medicaid Other | Admitting: Gastroenterology

## 2013-02-05 VITALS — BP 101/65 | HR 97 | Temp 97.8°F | Ht 63.0 in | Wt 138.6 lb

## 2013-02-05 DIAGNOSIS — R109 Unspecified abdominal pain: Secondary | ICD-10-CM | POA: Insufficient documentation

## 2013-02-05 DIAGNOSIS — K625 Hemorrhage of anus and rectum: Secondary | ICD-10-CM

## 2013-02-05 MED ORDER — PEG 3350-KCL-NA BICARB-NACL 420 G PO SOLR: 4000.0000 mL | ORAL | Status: DC

## 2013-02-05 NOTE — Progress Notes (Signed)
Primary Care Physician:  Tylene Fantasia., PA-C Primary Gastroenterologist:  Dr. Jena Gauss   Chief Complaint  Patient presents with  . Abdominal Pain    HPI:   [name] is a 57 year old female who presents today with abdominal pain at the request of Dr. Axel Filler with [facility]. She was seen 9/22 for consideration of laparoscopic cholecystectomy; however, due to her presentation, she has been referred for further GI evaluation prior to elective cholecystectomy.   Patient presents with her mother today. Abdominal pain description is vague, multiple locations. RUQ specifically, as well as LUQ, Upper abdomen, wraps around to back. Lower abdominal pain. Noted as constant. Pain pills help from hospital. Symptoms for at least a month. Worsening. Unbearable. Not worsened by eating/drinking. However, sometimes has a choking sensation, feels like something is stuck in epigastric region. No esophageal dysphagia. Some nausea, a few episodes of vomiting. Nausea worse with eating. No hematemesis. +GERD. Started on Prilosec 40 mg yesterday (9/29). States stool has been black/tarry a few times. +constipation since being on pain medication. Prior to this, no constipation issues. Occasional low-volume hematochezia in the past. [employer] PM chronically at night for knee pain  up until a month ago.  No prior EGD or colonoscopy.   Past Medical History  Diagnosis Date  . Chest pain   . Hiatal hernia   . SOB (shortness of breath)   . Palpitations   . PND (paroxysmal nocturnal dyspnea)   . Anxiety   . Hypertension   . Hypercholesterolemia   . Depression     Past Surgical History  Procedure Laterality Date  . Appendectomy    . Carpel tunnel    . Carpal tunnel release      Current Outpatient Prescriptions  Medication Sig Dispense Refill  . ALPRAZolam (XANAX) 1 MG tablet Take 0.5-1 mg by mouth 3 (three) times daily as needed for anxiety.       Marland Kitchen  amphetamine-dextroamphetamine (ADDERALL) 20 MG tablet Take 10-20 mg by mouth daily.      Marland Kitchen lamoTRIgine (LAMICTAL) 200 MG tablet Take 200 mg by mouth 3 (three) times daily.      Marland Kitchen lisinopril (PRINIVIL,ZESTRIL) 20 MG tablet Take 20 mg by mouth daily.      Marland Kitchen omeprazole (PRILOSEC) 20 MG capsule Take 40 mg by mouth daily.      . ondansetron (ZOFRAN) 4 MG tablet Take 1 tablet (4 mg total) by mouth every 6 (six) hours.  12 tablet  0  . oxyCODONE-acetaminophen (PERCOCET) 5-325 MG per tablet Take 2 tablets by mouth every 4 (four) hours as needed for pain.  30 tablet  0  . promethazine (PHENERGAN) 25 MG tablet Take 1 tablet (25 mg total) by mouth every 6 (six) hours as needed for nausea.  20 tablet  0  . simvastatin (ZOCOR) 20 MG tablet Take 20 mg by mouth every evening.       No current facility-administered medications for this visit.    Allergies as of 02/05/2013 - Review Complete 02/05/2013  Allergen Reaction Noted  . Morphine and related Nausea Only 01/24/2013    Family History  Problem Relation Age of Onset  . Heart attack Mother   . Colon cancer Neg Hx     History   Social History  . Marital Status: Divorced    Spouse Name: N/A    Number of Children: 2  . Years of Education: N/A   Occupational History  . Not on file.   Social  History Main Topics  . Smoking status: Never Smoker   . Smokeless tobacco: Not on file  . Alcohol Use: No  . Drug Use: No  . Sexual Activity: No   Other Topics Concern  . Not on file   Social History Narrative  . No narrative on file    Review of Systems: Negative unless mentioned in HPI.   Physical Exam: BP 101/65  Pulse 97  Temp(Src) 97.8 F (36.6 C) (Oral)  Ht 5\' 3"  (1.6 m)  Wt 138 lb 9.6 oz (62.869 kg)  BMI 24.56 kg/m2 General:   Alert and oriented. Flat affect, worried.  Head:  Normocephalic and atraumatic. Eyes:  Without icterus, sclera clear and conjunctiva pink.  Ears:  Normal auditory acuity. Nose:  No deformity, discharge,   or lesions. Mouth:  No deformity or lesions, oral mucosa pink.  Neck:  Supple, without mass or thyromegaly. Lungs:  Clear to auscultation bilaterally. No wheezes, rales, or rhonchi. No distress.  Heart:  S1, S2 present without murmurs appreciated.  Abdomen:  +BS, soft, TTP epigastric area, RUQ but no peritoneal signs, rebound, or guarding.  Rectal:  Deferred  Msk:  Symmetrical without gross deformities. Normal posture. Extremities:  Without clubbing or edema. Neurologic:  Alert and  oriented x4;  grossly normal neurologically. Skin:  Intact without significant lesions or rashes. Cervical Nodes:  No significant cervical adenopathy. Psych:  Alert and cooperative.Flat affect.  Lab Results  Component Value Date   WBC 6.6 02/01/2013   HGB 13.6 02/01/2013   HCT 40.0 02/01/2013   MCV 89.2 02/01/2013   PLT 242 02/01/2013   Lab Results  Component Value Date   ALT 27 02/01/2013   AST 41* 02/01/2013   ALKPHOS 97 02/01/2013   BILITOT 0.5 02/01/2013   Lab Results  Component Value Date   LIPASE 29 02/01/2013   Lab Results  Component Value Date   CREATININE 0.90 02/01/2013   BUN 13 02/01/2013   NA 138 02/01/2013   K 3.9 02/01/2013   CL 102 02/01/2013   CO2 26 02/01/2013   [place] of abdomen 9/26:No acute abnormalities. Cholelithiasis without evidence of acute cholecystitis. Unchanged hepatic cyst.  CT 9/26:  1. No acute findings within the abdomen or pelvis.  2. Liver cyst

## 2013-02-05 NOTE — Patient Instructions (Addendum)
Continue to take Prilosec each morning, 30 minutes before breakfast.   We have set you up for an upper endoscopy, possible dilation if needed, and colonoscopy with Dr. Jena Gauss.   Seek medical attention if you start throwing up blood or have numerous episodes of black/tarry stool.

## 2013-02-05 NOTE — Assessment & Plan Note (Addendum)
57 year old female with month-long history of abdominal pain, located in multiples sites (upper abdomen, lower abdomen) but most pronounced in epigastric and RUQ; [place] of abdomen with gallstones but no evidence of acute cholecystitis. Labs unrevealing as described above, and CT noted unchanged liver cyst. With her historically chronic use of Goody's powders, possible melena, I question gastritis/PUD. She also reports a sensation of "choking" and points to her epigastric region. No real esophageal dysphagia reported. Low-volume hematochezia likely benign anorectal source but recommend initial screening colonoscopy at time of EGD.   She has asked for narcotics repeatedly at this visit, to which I referred her to her primary care for this. If EGD is negative, would refer back to [facility] for elective cholecystectomy.   Proceed with TCS/EGD/ED with Dr. Jena Gauss in near future: the risks, benefits, and alternatives have been discussed with the patient in detail. The patient states understanding and desires to proceed. Phenergan 25 mg IV on call due to polypharmacy Continue Prilosec 40 mg daily

## 2013-02-05 NOTE — Progress Notes (Signed)
cc'd to pcp 

## 2013-02-05 NOTE — Assessment & Plan Note (Signed)
TCS at time of EGD.

## 2013-02-06 ENCOUNTER — Ambulatory Visit (HOSPITAL_COMMUNITY)
Admission: RE | Admit: 2013-02-06 | Discharge: 2013-02-06 | Disposition: A | Discharge status: Home / Self Care | Payer: Medicaid Other | Source: Ambulatory Visit | Attending: Internal Medicine | Admitting: Internal Medicine

## 2013-02-06 ENCOUNTER — Encounter (HOSPITAL_COMMUNITY)
Admission: RE | Disposition: A | Discharge status: Home / Self Care | Payer: Self-pay | Source: Ambulatory Visit | Attending: Internal Medicine

## 2013-02-06 ENCOUNTER — Encounter (HOSPITAL_COMMUNITY): Payer: Self-pay | Admitting: *Deleted

## 2013-02-06 DIAGNOSIS — K222 Esophageal obstruction: Secondary | ICD-10-CM

## 2013-02-06 DIAGNOSIS — R109 Unspecified abdominal pain: Secondary | ICD-10-CM

## 2013-02-06 DIAGNOSIS — K2940 Chronic atrophic gastritis without bleeding: Secondary | ICD-10-CM | POA: Insufficient documentation

## 2013-02-06 DIAGNOSIS — K625 Hemorrhage of anus and rectum: Secondary | ICD-10-CM

## 2013-02-06 DIAGNOSIS — D126 Benign neoplasm of colon, unspecified: Secondary | ICD-10-CM | POA: Insufficient documentation

## 2013-02-06 DIAGNOSIS — K648 Other hemorrhoids: Secondary | ICD-10-CM

## 2013-02-06 DIAGNOSIS — K259 Gastric ulcer, unspecified as acute or chronic, without hemorrhage or perforation: Secondary | ICD-10-CM

## 2013-02-06 DIAGNOSIS — R1310 Dysphagia, unspecified: Secondary | ICD-10-CM

## 2013-02-06 DIAGNOSIS — K5730 Diverticulosis of large intestine without perforation or abscess without bleeding: Secondary | ICD-10-CM | POA: Insufficient documentation

## 2013-02-06 DIAGNOSIS — K921 Melena: Secondary | ICD-10-CM | POA: Insufficient documentation

## 2013-02-06 DIAGNOSIS — I10 Essential (primary) hypertension: Secondary | ICD-10-CM | POA: Insufficient documentation

## 2013-02-06 HISTORY — PX: COLONOSCOPY, ESOPHAGOGASTRODUODENOSCOPY (EGD) AND ESOPHAGEAL DILATION: SHX5781

## 2013-02-06 SURGERY — COLONOSCOPY, ESOPHAGOGASTRODUODENOSCOPY (EGD) AND ESOPHAGEAL DILATION (ED)
Anesthesia: Moderate Sedation

## 2013-02-06 MED ORDER — MIDAZOLAM HCL 5 MG/5ML IJ SOLN
INTRAMUSCULAR | Status: AC
  Filled 2013-02-06: qty 10

## 2013-02-06 MED ORDER — STERILE WATER FOR IRRIGATION IR SOLN
Status: DC | PRN
  Administered 2013-02-06: 08:00:00

## 2013-02-06 MED ORDER — MIDAZOLAM HCL 5 MG/5ML IJ SOLN
INTRAMUSCULAR | Status: DC | PRN
  Administered 2013-02-06 (×2): 2 mg via INTRAVENOUS
  Administered 2013-02-06 (×4): 1 mg via INTRAVENOUS

## 2013-02-06 MED ORDER — BUTAMBEN-TETRACAINE-BENZOCAINE 2-2-14 % EX AERO
INHALATION_SPRAY | CUTANEOUS | Status: DC | PRN
  Administered 2013-02-06: 2 via TOPICAL

## 2013-02-06 MED ORDER — MEPERIDINE HCL 100 MG/ML IJ SOLN
INTRAMUSCULAR | Status: DC | PRN
  Administered 2013-02-06 (×4): 25 mg via INTRAVENOUS
  Administered 2013-02-06: 50 mg via INTRAVENOUS

## 2013-02-06 MED ORDER — ONDANSETRON HCL 4 MG/2ML IJ SOLN
INTRAMUSCULAR | Status: DC | PRN
  Administered 2013-02-06: 4 mg via INTRAVENOUS

## 2013-02-06 MED ORDER — ONDANSETRON HCL 4 MG/2ML IJ SOLN
INTRAMUSCULAR | Status: AC
  Filled 2013-02-06: qty 2

## 2013-02-06 MED ORDER — PROMETHAZINE HCL 25 MG/ML IJ SOLN
25.0000 mg | Freq: Once | INTRAMUSCULAR | Status: AC
  Administered 2013-02-06: 25 mg via INTRAVENOUS
  Filled 2013-02-06: qty 1

## 2013-02-06 MED ORDER — MEPERIDINE HCL 100 MG/ML IJ SOLN
INTRAMUSCULAR | Status: AC
  Filled 2013-02-06: qty 2

## 2013-02-06 MED ORDER — SODIUM CHLORIDE 0.9 % IV SOLN
INTRAVENOUS | Status: DC
  Administered 2013-02-06: 1000 mL via INTRAVENOUS

## 2013-02-06 MED ORDER — SODIUM CHLORIDE 0.9 % IJ SOLN
INTRAMUSCULAR | Status: AC
  Filled 2013-02-06: qty 10

## 2013-02-06 NOTE — Interval H&P Note (Signed)
History and Physical Interval Note:  02/06/2013 7:35 AM  [name]  has presented today for surgery, with the diagnosis of ABD PAIN  The various methods of treatment have been discussed with the patient and family. After consideration of risks, benefits and other options for treatment, the patient has consented to  Procedure(s) with comments: COLONOSCOPY, ESOPHAGOGASTRODUODENOSCOPY (EGD) AND POSSIBLE ESOPHAGEAL DILATION (N/A) - 7:30AM---GIVE 25MG  OF PHENERGAN as a surgical intervention .  The patient's history has been reviewed, patient examined, no change in status, stable for surgery.  I have reviewed the patient's chart and labs.  Questions were answered to the patient's satisfaction.         [name]  No change. Only complains of mild esophageal dysphagia when swallowing large pieces of meat.  The risks, benefits, limitations, imponderables and alternatives regarding both EGD and colonoscopy have been reviewed with the patient. Questions have been answered. All parties agreeable.

## 2013-02-06 NOTE — H&P (View-Only) (Signed)
 Primary Care Physician:  MUSE,ROCHELLE D., PA-C Primary Gastroenterologist:  Dr. Rourk   Chief Complaint  Patient presents with  . Abdominal Pain    HPI:   [name] is a 57-year-old female who presents today with abdominal pain at the request of Dr. Armando Ramirez with [facility]. She was seen 9/22 for consideration of laparoscopic cholecystectomy; however, due to her presentation, she has been referred for further GI evaluation prior to elective cholecystectomy.   Patient presents with her mother today. Abdominal pain description is vague, multiple locations. RUQ specifically, as well as LUQ, Upper abdomen, wraps around to back. Lower abdominal pain. Noted as constant. Pain pills help from hospital. Symptoms for at least a month. Worsening. Unbearable. Not worsened by eating/drinking. However, sometimes has a choking sensation, feels like something is stuck in epigastric region. No esophageal dysphagia. Some nausea, a few episodes of vomiting. Nausea worse with eating. No hematemesis. +GERD. Started on Prilosec 40 mg yesterday (9/29). States stool has been black/tarry a few times. +constipation since being on pain medication. Prior to this, no constipation issues. Occasional low-volume hematochezia in the past. Goody's Powders PM chronically at night for knee pain  up until a month ago.  No prior EGD or colonoscopy.   Past Medical History  Diagnosis Date  . Chest pain   . Hiatal hernia   . SOB (shortness of breath)   . Palpitations   . PND (paroxysmal nocturnal dyspnea)   . Anxiety   . Hypertension   . Hypercholesterolemia   . Depression     Past Surgical History  Procedure Laterality Date  . Appendectomy    . Carpel tunnel    . Carpal tunnel release      Current Outpatient Prescriptions  Medication Sig Dispense Refill  . ALPRAZolam (XANAX) 1 MG tablet Take 0.5-1 mg by mouth 3 (three) times daily as needed for anxiety.       .  amphetamine-dextroamphetamine (ADDERALL) 20 MG tablet Take 10-20 mg by mouth daily.      . lamoTRIgine (LAMICTAL) 200 MG tablet Take 200 mg by mouth 3 (three) times daily.      . lisinopril (PRINIVIL,ZESTRIL) 20 MG tablet Take 20 mg by mouth daily.      . omeprazole (PRILOSEC) 20 MG capsule Take 40 mg by mouth daily.      . ondansetron (ZOFRAN) 4 MG tablet Take 1 tablet (4 mg total) by mouth every 6 (six) hours.  12 tablet  0  . oxyCODONE-acetaminophen (PERCOCET) 5-325 MG per tablet Take 2 tablets by mouth every 4 (four) hours as needed for pain.  30 tablet  0  . promethazine (PHENERGAN) 25 MG tablet Take 1 tablet (25 mg total) by mouth every 6 (six) hours as needed for nausea.  20 tablet  0  . simvastatin (ZOCOR) 20 MG tablet Take 20 mg by mouth every evening.       No current facility-administered medications for this visit.    Allergies as of 02/05/2013 - Review Complete 02/05/2013  Allergen Reaction Noted  . Morphine and related Nausea Only 01/24/2013    Family History  Problem Relation Age of Onset  . Heart attack Mother   . Colon cancer Neg Hx     History   Social History  . Marital Status: Divorced    Spouse Name: N/A    Number of Children: 2  . Years of Education: N/A   Occupational History  . Not on file.   Social   History Main Topics  . Smoking status: Never Smoker   . Smokeless tobacco: Not on file  . Alcohol Use: No  . Drug Use: No  . Sexual Activity: No   Other Topics Concern  . Not on file   Social History Narrative  . No narrative on file    Review of Systems: Negative unless mentioned in HPI.   Physical Exam: BP 101/65  Pulse 97  Temp(Src) 97.8 F (36.6 C) (Oral)  Ht 5' 3" (1.6 m)  Wt 138 lb 9.6 oz (62.869 kg)  BMI 24.56 kg/m2 General:   Alert and oriented. Flat affect, worried.  Head:  Normocephalic and atraumatic. Eyes:  Without icterus, sclera clear and conjunctiva pink.  Ears:  Normal auditory acuity. Nose:  No deformity, discharge,   or lesions. Mouth:  No deformity or lesions, oral mucosa pink.  Neck:  Supple, without mass or thyromegaly. Lungs:  Clear to auscultation bilaterally. No wheezes, rales, or rhonchi. No distress.  Heart:  S1, S2 present without murmurs appreciated.  Abdomen:  +BS, soft, TTP epigastric area, RUQ but no peritoneal signs, rebound, or guarding.  Rectal:  Deferred  Msk:  Symmetrical without gross deformities. Normal posture. Extremities:  Without clubbing or edema. Neurologic:  Alert and  oriented x4;  grossly normal neurologically. Skin:  Intact without significant lesions or rashes. Cervical Nodes:  No significant cervical adenopathy. Psych:  Alert and cooperative.Flat affect.  Lab Results  Component Value Date   WBC 6.6 02/01/2013   HGB 13.6 02/01/2013   HCT 40.0 02/01/2013   MCV 89.2 02/01/2013   PLT 242 02/01/2013   Lab Results  Component Value Date   ALT 27 02/01/2013   AST 41* 02/01/2013   ALKPHOS 97 02/01/2013   BILITOT 0.5 02/01/2013   Lab Results  Component Value Date   LIPASE 29 02/01/2013   Lab Results  Component Value Date   CREATININE 0.90 02/01/2013   BUN 13 02/01/2013   NA 138 02/01/2013   K 3.9 02/01/2013   CL 102 02/01/2013   CO2 26 02/01/2013   US of abdomen 9/26:No acute abnormalities. Cholelithiasis without evidence of acute cholecystitis. Unchanged hepatic cyst.  CT 9/26:  1. No acute findings within the abdomen or pelvis.  2. Liver cyst    

## 2013-02-07 ENCOUNTER — Emergency Department (HOSPITAL_COMMUNITY)
Admission: EM | Admit: 2013-02-07 | Discharge: 2013-02-07 | Disposition: A | Discharge status: Home / Self Care | Payer: Medicaid Other | Attending: Emergency Medicine | Admitting: Emergency Medicine

## 2013-02-07 ENCOUNTER — Encounter (HOSPITAL_COMMUNITY): Payer: Self-pay | Admitting: *Deleted

## 2013-02-07 ENCOUNTER — Emergency Department (HOSPITAL_COMMUNITY): Payer: Medicaid Other

## 2013-02-07 DIAGNOSIS — F329 Major depressive disorder, single episode, unspecified: Secondary | ICD-10-CM | POA: Insufficient documentation

## 2013-02-07 DIAGNOSIS — K279 Peptic ulcer, site unspecified, unspecified as acute or chronic, without hemorrhage or perforation: Secondary | ICD-10-CM | POA: Insufficient documentation

## 2013-02-07 DIAGNOSIS — R109 Unspecified abdominal pain: Secondary | ICD-10-CM

## 2013-02-07 DIAGNOSIS — E7800 Pure hypercholesterolemia, unspecified: Secondary | ICD-10-CM | POA: Insufficient documentation

## 2013-02-07 DIAGNOSIS — Z9089 Acquired absence of other organs: Secondary | ICD-10-CM | POA: Insufficient documentation

## 2013-02-07 DIAGNOSIS — I10 Essential (primary) hypertension: Secondary | ICD-10-CM | POA: Insufficient documentation

## 2013-02-07 DIAGNOSIS — Z79899 Other long term (current) drug therapy: Secondary | ICD-10-CM | POA: Insufficient documentation

## 2013-02-07 DIAGNOSIS — F3289 Other specified depressive episodes: Secondary | ICD-10-CM | POA: Insufficient documentation

## 2013-02-07 DIAGNOSIS — G8929 Other chronic pain: Secondary | ICD-10-CM | POA: Insufficient documentation

## 2013-02-07 DIAGNOSIS — F411 Generalized anxiety disorder: Secondary | ICD-10-CM | POA: Insufficient documentation

## 2013-02-07 DIAGNOSIS — Z8719 Personal history of other diseases of the digestive system: Secondary | ICD-10-CM | POA: Insufficient documentation

## 2013-02-07 MED ORDER — FAMOTIDINE 20 MG PO TABS
20.0000 mg | ORAL_TABLET | Freq: Once | ORAL | Status: AC
  Administered 2013-02-07: 20 mg via ORAL
  Filled 2013-02-07: qty 1

## 2013-02-07 MED ORDER — FAMOTIDINE 20 MG PO TABS: 20.0000 mg | ORAL_TABLET | Freq: Two times a day (BID) | ORAL | Status: DC

## 2013-02-07 MED ORDER — SUCRALFATE 1 G PO TABS: 1.0000 g | ORAL_TABLET | Freq: Four times a day (QID) | ORAL | Status: DC

## 2013-02-07 MED ORDER — GI COCKTAIL ~~LOC~~
30.0000 mL | Freq: Once | ORAL | Status: AC
  Administered 2013-02-07: 30 mL via ORAL
  Filled 2013-02-07: qty 30

## 2013-02-07 NOTE — ED Provider Notes (Signed)
CSN: [id number]     Arrival date & time 02/07/13  1159 History   First MD Initiated Contact with Patient 02/07/13 1237     Chief Complaint  Patient presents with  . Abdominal Pain   (Consider location/radiation/quality/duration/timing/severity/associated sxs/prior Treatment) HPI Comments: 57 year old female, history of chronic abdominal pain who has recently been evaluated multiple times for her abdominal pain in the emergency department and by gastroenterology. She underwent an endoscopy as well as a colonoscopy yesterday and since that time has had slight increase in her abdominal pain. She states it feels just like it has over the last month. She is not having any vomiting or diarrhea, there is no fevers coughing or shortness of breath. The symptoms are persisting, nothing makes it better or worse, she states that she is not passing any gas and has not had a bowel movement today. She was not given any pain medication by her doctor, she is unsure when she has a followup with her gastroenterologist.  According to the medical record the patient had a Schatzki's ring which was dilated, she had peptic ulcer and some gastritis. She also had a single polyp and a hemorrhoid on colonoscopy but no other acute findings.  Patient is a 57 y.o. female presenting with abdominal pain. The history is provided by the patient.  Abdominal Pain   Past Medical History  Diagnosis Date  . Chest pain   . Hiatal hernia   . SOB (shortness of breath)   . Palpitations   . PND (paroxysmal nocturnal dyspnea)   . Anxiety   . Hypertension   . Hypercholesterolemia   . Depression    Past Surgical History  Procedure Laterality Date  . Appendectomy    . Carpel tunnel    . Carpal tunnel release     Family History  Problem Relation Age of Onset  . Heart attack Mother   . Colon cancer Neg Hx    History  Substance Use Topics  . Smoking status: Never Smoker   . Smokeless tobacco: Not on file  . Alcohol Use: No    OB History   Grav Para Term Preterm Abortions TAB SAB Ect Mult Living                 Review of Systems  Gastrointestinal: Positive for abdominal pain.  All other systems reviewed and are negative.    Allergies  Morphine and related  Home Medications   Current Outpatient Rx  Name  Route  Sig  Dispense  Refill  . ALPRAZolam (XANAX) 1 MG tablet   Oral   Take 0.5-1 mg by mouth 3 (three) times daily as needed for anxiety.          Marland Kitchen amphetamine-dextroamphetamine (ADDERALL) 20 MG tablet   Oral   Take 10-20 mg by mouth daily.         Marland Kitchen lamoTRIgine (LAMICTAL) 200 MG tablet   Oral   Take 200 mg by mouth 3 (three) times daily.         Marland Kitchen lisinopril (PRINIVIL,ZESTRIL) 20 MG tablet   Oral   Take 20 mg by mouth daily.         Marland Kitchen omeprazole (PRILOSEC) 20 MG capsule   Oral   Take 20 mg by mouth daily.          . ondansetron (ZOFRAN) 4 MG tablet   Oral   Take 1 tablet (4 mg total) by mouth every 6 (six) hours.   12 tablet  0   . oxyCODONE-acetaminophen (PERCOCET) 5-325 MG per tablet   Oral   Take 2 tablets by mouth every 4 (four) hours as needed for pain.   30 tablet   0   . promethazine (PHENERGAN) 25 MG tablet   Oral   Take 1 tablet (25 mg total) by mouth every 6 (six) hours as needed for nausea.   20 tablet   0   . simvastatin (ZOCOR) 20 MG tablet   Oral   Take 20 mg by mouth every evening.         . famotidine (PEPCID) 20 MG tablet   Oral   Take 1 tablet (20 mg total) by mouth 2 (two) times daily.   30 tablet   0   . sucralfate (CARAFATE) 1 G tablet   Oral   Take 1 tablet (1 g total) by mouth 4 (four) times daily.   30 tablet   1    BP 127/84  Pulse 84  Temp(Src) 98.6 F (37 C) (Oral)  Resp 16  SpO2 100% Physical Exam  Nursing note and vitals reviewed. Constitutional: She appears well-developed and well-nourished. No distress.  HENT:  Head: Normocephalic and atraumatic.  Mouth/Throat: Oropharynx is clear and moist. No  oropharyngeal exudate.  Eyes: Conjunctivae and EOM are normal. Pupils are equal, round, and reactive to light. Right eye exhibits no discharge. Left eye exhibits no discharge. No scleral icterus.  Neck: Normal range of motion. Neck supple. No JVD present. No thyromegaly present.  Cardiovascular: Normal rate, regular rhythm, normal heart sounds and intact distal pulses.  Exam reveals no gallop and no friction rub.   No murmur heard. Pulmonary/Chest: Effort normal and breath sounds normal. No respiratory distress. She has no wheezes. She has no rales.  Abdominal: Soft. Bowel sounds are normal. She exhibits no distension and no mass. There is tenderness ( Mild epigastric tenderness, no rebound or guarding, no peritoneal signs).  Musculoskeletal: Normal range of motion. She exhibits no edema and no tenderness.  Lymphadenopathy:    She has no cervical adenopathy.  Neurological: She is alert. Coordination normal.  Skin: Skin is warm and dry. No rash noted. No erythema.  Psychiatric: She has a normal mood and affect. Her behavior is normal.    ED Course  Procedures (including critical care time) Labs Review Labs Reviewed - No data to display Imaging Review Dg Abd Acute W/chest  02/07/2013   CLINICAL DATA:  Abdominal pain for 4 months  EXAM: ACUTE ABDOMEN SERIES (ABDOMEN 2 VIEW & CHEST 1 VIEW)  COMPARISON:  None.  FINDINGS: There is no evidence of dilated bowel loops or free intraperitoneal air. No radiopaque calculi or other significant radiographic abnormality is seen. Heart size and mediastinal contours are within normal limits. Both lungs are clear.  IMPRESSION: No acute abnormality noted.   Electronically Signed   By: Alcide Clever   On: 02/07/2013 13:33    MDM   1. Abdominal pain   2. PUD (peptic ulcer disease)    Overall the patient appears well, her vital signs are normal, she does not have an acute abdomen x-rays reveal no signs of free air, perforation or or bowel obstruction. I will  start her on increased antacid medications and send her home. She can followup with gastroenterology.  Ultrasound and CT scans over the last month have been reviewed and there are no acute findings to suggest that this is anything more than peptic ulcer disease. I doubt that this is acute cholecystitis  as she has no Murphy sign, no pain in the right upper quadrant on palpation.   Meds given in ED:  Medications  famotidine (PEPCID) tablet 20 mg (not administered)  gi cocktail (Maalox,Lidocaine,Donnatal) (not administered)    New Prescriptions   FAMOTIDINE (PEPCID) 20 MG TABLET    Take 1 tablet (20 mg total) by mouth 2 (two) times daily.   SUCRALFATE (CARAFATE) 1 G TABLET    Take 1 tablet (1 g total) by mouth 4 (four) times daily.        Vida Roller, MD 02/07/13 1415

## 2013-02-07 NOTE — Op Note (Addendum)
[facility] [address] [place], [address]   COLONOSCOPY PROCEDURE REPORT  PATIENT: Nancy, Yang  MR#:         [id number] BIRTHDATE: 03/12/1956 , 57  yrs. old GENDER: Female ENDOSCOPIST: R.  Roetta Sessions, MD [facility] REFERRED BY:  [facility] Department [facility] PROCEDURE DATE:  02/06/2013 PROCEDURE:     Ileocolonoscopy with biopsy  INDICATIONS: Paper hematochezia  INFORMED CONSENT:  The risks, benefits, alternatives and imponderables including but not limited to bleeding, perforation as well as the possibility of a missed lesion have been reviewed.  The potential for biopsy, lesion removal, etc. have also been discussed.  Questions have been answered.  All parties agreeable. Please see the history and physical in the medical record for more information.  MEDICATIONS: Versed 8 mg IV and Demerol 150 mg IV in divided doses. Zofran 4 mg IV and Phenergan 25 mg IV.  DESCRIPTION OF PROCEDURE:  After a digital rectal exam was performed, the EC-[id number] ([id number])  colonoscope was advanced from the anus through the rectum and colon to the area of the cecum, ileocecal valve and appendiceal orifice.  The cecum was deeply intubated.  These structures were well-seen and photographed for the record.  From the level of the cecum and ileocecal valve, the scope was slowly and cautiously withdrawn.  The mucosal surfaces were carefully surveyed utilizing scope tip deflection to facilitate fold flattening as needed.  The scope was pulled down into the rectum where a thorough examination including retroflexion was performed.    FINDINGS:  Adequate preparation. Anal canal hemorrhoids; otherwise, normal rectum. Scattered left-sided and transverse diverticula. (1) diminutive polyp in the mid descending segment; otherwise, the remainder of the colonic mucosa appeared normal. The distal 5 cm of terminal ileal mucosa also appeared normal.  THERAPEUTIC / DIAGNOSTIC  MANEUVERS PERFORMED:  None  COMPLICATIONS: none  CECAL WITHDRAWAL TIME:  10 minutes  IMPRESSION:  Colonic diverticulosis. Single colonic polyp  - removed as described above. Hemorrhoids-likely source of hematochezia  RECOMMENDATIONS:   Followup on pathology. Daily fiber supplement such as Benefiber 2 teaspoons twice daily.  Course of Anusol suppositories. See EGD report.   _______________________________ eSigned:  R. Roetta Sessions, MD FACP [facility] 02/06/2013 8:48 AM   CC:

## 2013-02-07 NOTE — ED Notes (Signed)
Pt states she had a colonoscopy on 9/26, outpatient and states pain is worse than it was before. States pain since having the procedure.

## 2013-02-07 NOTE — Op Note (Addendum)
[facility] [address] [place], [address]   ENDOSCOPY PROCEDURE REPORT  PATIENT: [name], [name]  MR#: [id number] BIRTHDATE: [birth year] , 57  yrs. old GENDER: Female ENDOSCOPIST: R.  Roetta Sessions, MD FACP [facility] REFERRED BY:  Health Department [facility] PROCEDURE DATE:  02/06/2013 PROCEDURE:     EGD with Elease Hashimoto dilation followed by gastric biopsy  INDICATIONS:      Nonspecific abdominal pain; esophageal dysphagia  INFORMED CONSENT:   The risks, benefits, limitations, alternatives and imponderables have been discussed.  The potential for biopsy, esophogeal dilation, etc. have also been reviewed.  Questions have been answered.  All parties agreeable.  Please see the history and physical in the medical record for more information.  MEDICATIONS:    Versed 5 mg IV and Demerol 100 mg IV in divided doses. Zofran 4 mg IV. Phenergan 25 mg IV. Cetacaine spray.  DESCRIPTION OF PROCEDURE:   The EG-[id number] ([id number])  endoscope was introduced through the mouth and advanced to the second portion of the duodenum without difficulty or limitations.  The mucosal surfaces were surveyed very carefully during advancement of the scope and upon withdrawal.  Retroflexion view of the proximal stomach and esophagogastric junction was performed.      FINDINGS:  Schatzki's ring; otherwise, normal esophagus. Stomach empty. Small hiatal hernia. 7 mm elliptical prepyloric antral ulcer with multiple adjacent areas of scar. No infiltrating process seen. Mild diffuse mottling of the gastric mucosa. Patent pylorus. Normal first and second portion of the duodenum.  THERAPEUTIC / DIAGNOSTIC MANEUVERS PERFORMED:  A 54 French Maloney dilator was passed to full insertion without resistance. A look back revealed the ring was partially disrupted but remained intact. Subsequently,  four-quadrant bites of the ring with biopsy forceps were taken to additionally disrupt it. This was done  effectively and without apparent complication.  Finally, biopsies the area of gastric ulceration were taken and gastric mucosal biopsies were taken to assess for H. pylori.   COMPLICATIONS:  None  IMPRESSION:  Schatzki's ring-status post dilation and disruption as described above. Hiatal hernia.  Antral ulceration with adjacent scarring. Abnormal gastric mucosa diffusely - status post biopsy.  RECOMMENDATIONS:    Followup on pathology. See colonoscopy report.    _______________________________ R. Roetta Sessions, MD FACP [facility] eSigned:  R. Roetta Sessions, MD FACP [facility] 02/06/2013 8:13 AM     CC:  PATIENT NAME:  Ramisa, Duman MR#: [id number]

## 2013-02-10 ENCOUNTER — Encounter: Payer: Self-pay | Admitting: Internal Medicine

## 2013-02-11 ENCOUNTER — Encounter (HOSPITAL_COMMUNITY): Payer: Self-pay | Admitting: Internal Medicine

## 2013-02-12 ENCOUNTER — Telehealth: Payer: Self-pay | Admitting: *Deleted

## 2013-02-12 NOTE — Telephone Encounter (Signed)
Pt has an appointment Wednesday 02/13/13 with AS at 11:30

## 2013-02-12 NOTE — Telephone Encounter (Signed)
Please see procedure results letter and convey information to patient. If she is having that much of a problem m,then she needs to be brought in tomorrow to be seen by extender

## 2013-02-12 NOTE — Telephone Encounter (Signed)
Pt had tcs one day last week and is still hurting and has been to the ED since. Please advise patient and call [phone]

## 2013-02-12 NOTE — Telephone Encounter (Signed)
Pt called stating she had a procedure done last week, 02-06-13. I told pt it takes 7 business days to get results back and Dr. Jena Gauss would have to review them and he would be in touch with her, pt states she has been in so much pain she had to go to ER. Please advise [phone]

## 2013-02-12 NOTE — Telephone Encounter (Signed)
Pt is hurt in the middle stomach up and down both side and all in her back. She is hurting more now then before. Pain level is 10. She when to the ER and they did some x-rays. They changed her medication and that is not helping. Please advise

## 2013-02-13 ENCOUNTER — Ambulatory Visit (HOSPITAL_COMMUNITY)
Admission: RE | Admit: 2013-02-13 | Discharge: 2013-02-13 | Disposition: A | Discharge status: Home / Self Care | Payer: Medicaid Other | Source: Ambulatory Visit | Attending: Gastroenterology | Admitting: Gastroenterology

## 2013-02-13 ENCOUNTER — Other Ambulatory Visit: Payer: Self-pay | Admitting: Gastroenterology

## 2013-02-13 ENCOUNTER — Encounter: Payer: Self-pay | Admitting: Gastroenterology

## 2013-02-13 ENCOUNTER — Ambulatory Visit (INDEPENDENT_AMBULATORY_CARE_PROVIDER_SITE_OTHER): Payer: Medicaid Other | Admitting: Gastroenterology

## 2013-02-13 VITALS — BP 137/87 | HR 78 | Temp 99.1°F | Ht 63.0 in | Wt 140.4 lb

## 2013-02-13 DIAGNOSIS — M5137 Other intervertebral disc degeneration, lumbosacral region: Secondary | ICD-10-CM | POA: Insufficient documentation

## 2013-02-13 DIAGNOSIS — M51379 Other intervertebral disc degeneration, lumbosacral region without mention of lumbar back pain or lower extremity pain: Secondary | ICD-10-CM | POA: Insufficient documentation

## 2013-02-13 DIAGNOSIS — R109 Unspecified abdominal pain: Secondary | ICD-10-CM

## 2013-02-13 DIAGNOSIS — M546 Pain in thoracic spine: Secondary | ICD-10-CM | POA: Insufficient documentation

## 2013-02-13 MED ORDER — HYDROCODONE-ACETAMINOPHEN 5-325 MG PO TABS: 1.0000 | ORAL_TABLET | Freq: Four times a day (QID) | ORAL | Status: DC | PRN

## 2013-02-13 MED ORDER — HYOSCYAMINE SULFATE 0.125 MG SL SUBL: 0.1250 mg | SUBLINGUAL_TABLET | SUBLINGUAL | Status: DC | PRN

## 2013-02-13 MED ORDER — PANTOPRAZOLE SODIUM 40 MG PO TBEC: 40.0000 mg | DELAYED_RELEASE_TABLET | Freq: Two times a day (BID) | ORAL | Status: DC

## 2013-02-13 NOTE — Assessment & Plan Note (Signed)
57 year old female with continued/persistent abdominal pain, similar to original presentation in consultation recently. Pain is diffuse, vague report. EGD/TCS on file, with gastric ulcer noted. She is no longer taking a PPI and is on Pepcid BID. Thus far, labs and xrays have been unrevealing. I question chronic abdominal pain, compounded by known gastric ulcer in this setting. Other differential includes radicular pain, as she notes pain in her back. Unable to rule out pancreatitis although less likely. Doubt acute cholecystitis. She is asking for pain medication again; I have informed her I would give her only a short course, and she would not have any further from this office.   Switch from Pepcid to Protonix (previously on Prilosec for only short course) BID EGD in 3 months Spine xrays today: history of DDD, ?radicular component Lipase norco # 30 tablets, no further narcotics through this office Likely refer to Pain management and return to Gen Surgery on an elective basis to discuss lap chole

## 2013-02-13 NOTE — Progress Notes (Signed)
Referring Provider: Tylene Fantasia., PA-C Primary Care Physician:  Tylene Fantasia., PA-C Primary GI: Dr. Jena Gauss   CC: Abdominal pain/back pain   HPI:   SILVANNA OHMER returns today in an urgent follow-up secondary to persistent abdominal pain. Underwent upper endoscopy and colonoscopy as noted below. Presented to the ED the day after her procedures without concerning findings. AAS negative for free air. [place] of abdomen 9/26 with cholelithiasis. CT same day without acute findings, notes liver cyst. She has previously been seen by Dr. Derrell Lolling for consideration of laparoscopic cholecystectomy Sept 22, 2014. At that time, he had recommended a GI work-up prior to consideration of elective procedure. I personally reviewed prior CT with Dr. Tyron Russell; she has no plaque disease that would indicate a mesenteric vasculature issue.   Pain located upper abdomen, bilateral lateral sides, mid back. Constant. Feels like it has worsened. When bending down, pain feels like it shoots from abdomen to mid back. Afraid to even eat anything. Eats fruit, some things bother and some don't. No nausea. No dysphagia. No constipation, diarrhea. Some fever off and on subjectively (hasn't taken temperature), sometimes chills. Has hot flashes. Taking Pepcid BID, stopped Prilosec. Sometimes legs ache.    RMR. Benign colon polyp, diverticulosis, hemorrhoids. EGD with Schatzki's ring s/p 54 F dilation, 7mm elliptical prepyloric antral ulcer with adjacent scarring, negative path  Past Medical History  Diagnosis Date  . Chest pain   . Hiatal hernia   . SOB (shortness of breath)   . Palpitations   . PND (paroxysmal nocturnal dyspnea)   . Anxiety   . Hypertension   . Hypercholesterolemia   . Depression     Past Surgical History  Procedure Laterality Date  . Appendectomy    . Carpel tunnel    . Carpal tunnel release    . Colonoscopy, esophagogastroduodenoscopy (egd) and esophageal dilation N/A 02/06/2013    RMR. Benign colon  polyp, diverticulosis, hemorrhoids. EGD with Schatzki's ring s/p 54 F dilation, 7mm elliptical prepyloric antral ulcer with adjacent scarring, negative path    Current Outpatient Prescriptions  Medication Sig Dispense Refill  . ALPRAZolam (XANAX) 1 MG tablet Take 0.5-1 mg by mouth 3 (three) times daily as needed for anxiety.       Marland Kitchen amphetamine-dextroamphetamine (ADDERALL) 20 MG tablet Take 10-20 mg by mouth daily.      . famotidine (PEPCID) 20 MG tablet Take 1 tablet (20 mg total) by mouth 2 (two) times daily.  30 tablet  0  . lamoTRIgine (LAMICTAL) 200 MG tablet Take 200 mg by mouth 3 (three) times daily.      Marland Kitchen lisinopril (PRINIVIL,ZESTRIL) 20 MG tablet Take 20 mg by mouth daily.      . ondansetron (ZOFRAN) 4 MG tablet Take 1 tablet (4 mg total) by mouth every 6 (six) hours.  12 tablet  0  . promethazine (PHENERGAN) 25 MG tablet Take 1 tablet (25 mg total) by mouth every 6 (six) hours as needed for nausea.  20 tablet  0  . simvastatin (ZOCOR) 20 MG tablet Take 20 mg by mouth every evening.      . sucralfate (CARAFATE) 1 G tablet Take 1 tablet (1 g total) by mouth 4 (four) times daily.  30 tablet  1  . omeprazole (PRILOSEC) 20 MG capsule Take 20 mg by mouth daily.       Marland Kitchen oxyCODONE-acetaminophen (PERCOCET) 5-325 MG per tablet Take 2 tablets by mouth every 4 (four) hours as needed for pain.  30 tablet  0   No current facility-administered medications for this visit.    Allergies as of 02/13/2013 - Review Complete 02/13/2013  Allergen Reaction Noted  . Morphine and related Nausea Only 01/24/2013    Family History  Problem Relation Age of Onset  . Heart attack Mother   . Colon cancer Neg Hx     History   Social History  . Marital Status: Divorced    Spouse Name: N/A    Number of Children: 2  . Years of Education: N/A   Social History Main Topics  . Smoking status: Never Smoker   . Smokeless tobacco: None  . Alcohol Use: No  . Drug Use: No  . Sexual Activity: No   Other  Topics Concern  . None   Social History Narrative  . None    Review of Systems: As mentioned in HPI.   Physical Exam: BP 137/87  Pulse 78  Temp(Src) 99.1 F (37.3 C) (Oral)  Ht 5\' 3"  (1.6 m)  Wt 140 lb 6.4 oz (63.685 kg)  BMI 24.88 kg/m2 General:   Alert and oriented. No distress noted. Pleasant and cooperative.  Head:  Normocephalic and atraumatic. Eyes:  Conjuctiva clear without scleral icterus. Heart:  S1, S2 present without murmurs, rubs, or gallops. Regular rate and rhythm. Abdomen:  +BS, soft, non-tender and non-distended. No rebound or guarding. No HSM or masses noted. Negative Carnett's sign.  Msk:  Symmetrical without gross deformities. Normal posture. Extremities:  Without edema. Neurologic:  Alert and  oriented x4;  grossly normal neurologically. Skin:  Intact without significant lesions or rashes. Psych:  Alert and cooperative. Normal mood and affect.

## 2013-02-13 NOTE — Patient Instructions (Signed)
Please have blood work and xrays done. We will call with the results.   Stop Pepcid and Prilosec. I have sent Protonix to the pharmacy. You will need to take this before breakfast and dinner twice a day on an empty stomach.  Further recommendations to follow once results obtained.

## 2013-02-13 NOTE — Progress Notes (Signed)
Quick Note:  xrays show just mild increase in degenerative changes since last exam in 2011.  Lipase was mildly elevated; let's add on an HFP to blood in lab if at all possible. ______

## 2013-02-14 NOTE — Progress Notes (Signed)
CC'd to PCP 

## 2013-02-18 ENCOUNTER — Other Ambulatory Visit: Payer: Self-pay | Admitting: Gastroenterology

## 2013-02-18 LAB — HEPATIC FUNCTION PANEL
ALT: 13 U/L (ref 0–35)
AST: 20 U/L (ref 0–37)
Albumin: 4.2 g/dL (ref 3.5–5.2)
Alkaline Phosphatase: 82 U/L (ref 39–117)
Bilirubin, Direct: <0.1 mg/dL (ref 0.0–0.3)
Total Protein: 7.4 g/dL (ref 6.0–8.3)

## 2013-02-19 ENCOUNTER — Encounter: Payer: Self-pay | Admitting: Internal Medicine

## 2013-02-21 NOTE — Progress Notes (Signed)
Quick Note:  HFP normal.  How is patient doing ______

## 2013-02-27 NOTE — Progress Notes (Signed)
Quick Note:  Normal LFTs.  How is patient doing? ______

## 2013-03-04 NOTE — Progress Notes (Signed)
CC'd to [facility] Health Dept.

## 2013-03-19 NOTE — Progress Notes (Signed)
Quick Note:  We can refer back to Dr. Derrell Lolling in [employer] for consideration of lap chole, but if she is stable, I feel she should wait until after repeat EGD is performed to verify ulcer healing.  Please let her know that we will see her in Jan as scheduled and refer her back then. ______

## 2013-05-22 ENCOUNTER — Ambulatory Visit: Payer: Medicaid Other | Admitting: Gastroenterology

## 2013-06-17 ENCOUNTER — Ambulatory Visit: Payer: Medicaid Other | Admitting: Gastroenterology

## 2013-07-01 ENCOUNTER — Other Ambulatory Visit: Payer: Self-pay

## 2013-07-02 ENCOUNTER — Encounter (HOSPITAL_COMMUNITY): Payer: Self-pay | Admitting: Pharmacy Technician

## 2013-07-02 MED ORDER — HYOSCYAMINE SULFATE 0.125 MG SL SUBL: SUBLINGUAL_TABLET | SUBLINGUAL | Status: DC

## 2013-07-04 ENCOUNTER — Inpatient Hospital Stay (HOSPITAL_COMMUNITY): Admission: RE | Admit: 2013-07-04 | Payer: Medicaid Other | Source: Ambulatory Visit

## 2013-07-04 ENCOUNTER — Ambulatory Visit: Payer: Medicaid Other | Admitting: Gastroenterology

## 2013-07-05 ENCOUNTER — Ambulatory Visit: Payer: Medicaid Other | Admitting: Gastroenterology

## 2013-07-10 ENCOUNTER — Encounter (HOSPITAL_COMMUNITY)
Admission: RE | Admit: 2013-07-10 | Discharge: 2013-07-10 | Disposition: A | Discharge status: Home / Self Care | Payer: Medicaid Other | Source: Ambulatory Visit | Attending: Orthopedic Surgery | Admitting: Orthopedic Surgery

## 2013-07-10 ENCOUNTER — Encounter (HOSPITAL_COMMUNITY): Payer: Self-pay

## 2013-07-10 ENCOUNTER — Encounter (HOSPITAL_COMMUNITY): Payer: Self-pay | Admitting: Pharmacy Technician

## 2013-07-10 DIAGNOSIS — Z01812 Encounter for preprocedural laboratory examination: Secondary | ICD-10-CM | POA: Insufficient documentation

## 2013-07-10 HISTORY — DX: Sleep disorder, unspecified: G47.9

## 2013-07-10 HISTORY — DX: Personal history of peptic ulcer disease: Z87.11

## 2013-07-10 HISTORY — DX: Bipolar disorder, unspecified: F31.9

## 2013-07-10 HISTORY — DX: Dorsalgia, unspecified: M54.9

## 2013-07-10 HISTORY — DX: Personal history of other diseases of the digestive system: Z87.19

## 2013-07-10 HISTORY — DX: Cardiac murmur, unspecified: R01.1

## 2013-07-10 HISTORY — DX: Other chronic pain: G89.29

## 2013-07-10 HISTORY — DX: Unspecified osteoarthritis, unspecified site: M19.90

## 2013-07-10 HISTORY — DX: Gastro-esophageal reflux disease without esophagitis: K21.9

## 2013-07-10 LAB — CBC
HCT: 36.7 % (ref 36.0–46.0)
Hemoglobin: 12.5 g/dL (ref 12.0–15.0)
MCH: 29.8 pg (ref 26.0–34.0)
MCHC: 34.1 g/dL (ref 30.0–36.0)
MCV: 87.4 fL (ref 78.0–100.0)
PLATELETS: 177 10*3/uL (ref 150–400)
RBC: 4.2 MIL/uL (ref 3.87–5.11)
RDW: 12.7 % (ref 11.5–15.5)
WBC: 5.4 10*3/uL (ref 4.0–10.5)

## 2013-07-10 LAB — PROTIME-INR
INR: 1.01 (ref 0.00–1.49)
PROTHROMBIN TIME: 13.1 s (ref 11.6–15.2)

## 2013-07-10 LAB — BASIC METABOLIC PANEL
BUN: 17 mg/dL (ref 6–23)
CALCIUM: 9.7 mg/dL (ref 8.4–10.5)
CO2: 24 mEq/L (ref 19–32)
CREATININE: 0.74 mg/dL (ref 0.50–1.10)
Chloride: 105 mEq/L (ref 96–112)
GFR calc Af Amer: >90 mL/min (ref >90)
GLUCOSE: 84 mg/dL (ref 70–99)
Potassium: 3.9 mEq/L (ref 3.7–5.3)
SODIUM: 141 meq/L (ref 137–147)

## 2013-07-10 LAB — SURGICAL PCR SCREEN
MRSA, PCR: NEGATIVE
Staphylococcus aureus: POSITIVE — AB

## 2013-07-10 LAB — APTT: APTT: 29 s (ref 24–37)

## 2013-07-10 LAB — ABO/RH: ABO/RH(D): O POS

## 2013-07-10 NOTE — Patient Instructions (Addendum)
LAJUANA PATCHELL  07/10/2013                           YOUR PROCEDURE IS SCHEDULED ON: 07/15/13               PLEASE REPORT TO SHORT STAY CENTER AT : 5:00 AM               CALL THIS NUMBER IF ANY PROBLEMS THE DAY OF SURGERY :               [phone]                                REMEMBER:   Do not eat food or drink liquids AFTER MIDNIGHT                 Take these medicines the morning of surgery with A SIP OF WATER:  HYOSCYAMINE / ALPRAZOLAM / OMEPRAZOLE /  ULTRAM IF NEEDED   Do not wear jewelry, make-up   Do not wear lotions, powders, or perfumes.   Do not shave legs or underarms 12 hrs. before surgery (men may shave face)  Do not bring valuables to the hospital.  Contacts, dentures or bridgework may not be worn into surgery.  Leave suitcase in the car. After surgery it may be brought to your room.  For patients admitted to the hospital more than one night, checkout time is            11:00 AM                                                       The day of discharge.   Patients discharged the day of surgery will not be allowed to drive home.            If going home same day of surgery, must have someone stay with you              FIRST 24 hrs at home and arrange for some one to drive you              home from hospital.    Special Instructions             Please read over the following fact sheets that you were given:               1. [place]illa               2. INCENTIVE SPIROMETER               3. MRSA INFORMATION SHEET                                                X_____________________________________________________________________        Failure to follow these instructions may result in cancellation of your surgery

## 2013-07-14 NOTE — H&P (Signed)
TOTAL KNEE ADMISSION H&P  Patient is being admitted for left medial unicompartmental knee arthroplasty.  Subjective:  Chief Complaint:   Left knee medial compartment OA / pain.  HPI: [name], 58 y.o. female, evaluation of pain in both knees. She's had increasing pain in the medial aspects of both knees. She's gone through conservative treatment of a cortisone injection and Visco supplementation. She does have some chronic back issues for which she takes Percocet chronically.She is seen and evaluated in the office today. She is a relatively thin female with neutrally aligned lower extremities. Most of her tenderness is on the medial side of the knee per her reports as well as exam findings. Knee ligaments are stable. She has full knee extension and flexion, no flexion contractures. She has no palpable tenderness in the calves.  Bilateral knee osteoarthritis, predominantly involving the medial compartment of the knee, correlating with exam symptoms. Left knee is currently worse than right. Radiographs done standing AP of both knees and standing lateral of both knees show medial compartment degenerative changes with complete loss of joint space medially. The lateral and patellofemoral compartments are relatively normal. After reviewing with her the current situation, she, at this point, has failed conservative measures and is ready to proceed with surgical intervention. She is a candidate for partial knee arthroplasty based on her location of pain, exam findings, as well as radiographs. This has a secondary appeal due to her chronic pain issues in her back in terms of ease of recoverability and the normalcy of which bilateral knees will feel. After reviewing risks of infection, DVT, component failure, need for potential revision down the road, consent will be obtained based on our discussion today.  D/C Plans:      Home with HHPT  Post-op Meds:     No Rx given  Tranexamic Acid:   To  be given  Decadron:    To be given  FYI:    ASA post-op  Oxy post-op   Patient Active Problem List   Diagnosis Date Noted  . Abdominal pain, unspecified site 02/13/2013  . Rectal bleeding 02/05/2013  . Abdominal  pain, other specified site 02/05/2013   Past Medical History  Diagnosis Date  . Hiatal hernia   . Palpitations   . Anxiety   . Hypertension   . Hypercholesterolemia   . Depression   . Heart murmur   . Chest pain     "STRESS"  . Arthritis   . GERD (gastroesophageal reflux disease)   . History of stomach ulcers   . Difficulty sleeping   . Bipolar 1 disorder   . Back pain, chronic     Past Surgical History  Procedure Laterality Date  . Appendectomy    . Carpal tunnel release      BILATERAL  . Colonoscopy, esophagogastroduodenoscopy (egd) and esophageal dilation N/A 02/06/2013    RMR. Benign colon polyp, diverticulosis, hemorrhoids. EGD with Schatzki's ring s/p 69 F dilation, 1mm elliptical prepyloric antral ulcer with adjacent scarring, negative path    No prescriptions prior to admission   Allergies  Allergen Reactions  . Morphine And Related Nausea Only    Dizziness     History  Substance Use Topics  . Smoking status: Never Smoker   . Smokeless tobacco: Not on file  . Alcohol Use: No    Family History  Problem Relation Age of Onset  . Heart attack Mother   . Colon cancer Neg Hx      Review of  Systems  Constitutional: Negative.   HENT: Negative.   Eyes: Negative.   Respiratory: Negative.   Cardiovascular: Negative.   Gastrointestinal: Positive for heartburn.  Genitourinary: Negative.   Musculoskeletal: Positive for back pain and joint pain.  Skin: Negative.   Neurological: Negative.   Endo/Heme/Allergies: Negative.   Psychiatric/Behavioral: Positive for depression. The patient is nervous/anxious.     Objective:  Physical Exam  Constitutional: She is oriented to person, place, and time. She appears well-developed and well-nourished.   HENT:  Head: Normocephalic and atraumatic.  Mouth/Throat: Oropharynx is clear and moist.  Eyes: Pupils are equal, round, and reactive to light.  Neck: Neck supple. No JVD present. No tracheal deviation present. No thyromegaly present.  Cardiovascular: Normal rate, regular rhythm and intact distal pulses.   Murmur heard. Respiratory: Effort normal and breath sounds normal. No respiratory distress. She has no wheezes.  GI: Soft. There is no tenderness. There is no guarding.  Musculoskeletal:       Left knee: She exhibits bony tenderness. She exhibits normal range of motion, no ecchymosis, no deformity, no laceration, no erythema and normal alignment. Tenderness found. Medial joint line tenderness noted. No lateral joint line tenderness noted.  Lymphadenopathy:    She has no cervical adenopathy.  Neurological: She is alert and oriented to person, place, and time.  Skin: Skin is warm and dry.  Psychiatric: She has a normal mood and affect.    Labs:  Estimated body mass index is 24.88 kg/(m^2) as calculated from the following:   Height as of 02/13/13: 5\' 3"  (1.6 m).   Weight as of 02/13/13: 63.685 kg (140 lb 6.4 oz).   Imaging Review Plain radiographs demonstrate severe degenerative joint disease of the left knee medial compartment. The overall alignment is neutral. The bone quality appears to be good for age and reported activity level.  Assessment/Plan:  End stage arthritis, left knee, medial compartment  The patient history, physical examination, clinical judgment of the provider and imaging studies are consistent with end stage degenerative joint disease of the left knee(s) and medial unicompartmental knee arthroplasty is deemed medically necessary. The treatment options including medical management, injection therapy arthroscopy and arthroplasty were discussed at length. The risks and benefits of total knee arthroplasty were presented and reviewed. The risks due to aseptic loosening,  infection, stiffness, patella tracking problems, thromboembolic complications and other imponderables were discussed. The patient acknowledged the explanation, agreed to proceed with the plan and consent was signed. Patient is being admitted for inpatient treatment for surgery, pain control, PT, OT, prophylactic antibiotics, VTE prophylaxis, progressive ambulation and ADL's and discharge planning. The patient is planning to be discharged home with home health services.     [place] Pugh Rena Hunke   PAC  07/14/2013, 2:14 PM

## 2013-07-14 NOTE — Anesthesia Preprocedure Evaluation (Addendum)
Anesthesia Evaluation  Patient identified by MRN, date of birth, ID band Patient awake    Reviewed: Allergy & Precautions, H&P , NPO status , Patient's Chart, lab work & pertinent test results  Airway Mallampati: II TM Distance: >3 FB Neck ROM: full    Dental  (+) Dental Advisory Given Cracked left upper front tooth:   Pulmonary neg pulmonary ROS,  breath sounds clear to auscultation  Pulmonary exam normal       Cardiovascular hypertension, Pt. on medications Rhythm:regular Rate:Normal  Palpitations.  Stress induced atypical chest pain   Neuro/Psych Anxiety Depression Bipolar Disorder Chronic back pain negative neurological ROS  negative psych ROS   GI/Hepatic negative GI ROS, Neg liver ROS, hiatal hernia, GERD-  Medicated and Controlled,  Endo/Other  negative endocrine ROS  Renal/GU negative Renal ROS  negative genitourinary   Musculoskeletal   Abdominal   Peds  Hematology negative hematology ROS (+)   Anesthesia Other Findings   Reproductive/Obstetrics negative OB ROS                        Anesthesia Physical Anesthesia Plan  ASA: II  Anesthesia Plan: Spinal   Post-op Pain Management:    Induction:   Airway Management Planned: Simple Face Mask  Additional Equipment:   Intra-op Plan:   Post-operative Plan:   Informed Consent: I have reviewed the patients History and Physical, chart, labs and discussed the procedure including the risks, benefits and alternatives for the proposed anesthesia with the patient or authorized representative who has indicated his/her understanding and acceptance.   Dental Advisory Given  Plan Discussed with: CRNA and Surgeon  Anesthesia Plan Comments:        Anesthesia Quick Evaluation

## 2013-07-15 ENCOUNTER — Ambulatory Visit (HOSPITAL_COMMUNITY): Payer: Medicaid Other | Admitting: Anesthesiology

## 2013-07-15 ENCOUNTER — Encounter (HOSPITAL_COMMUNITY): Payer: Self-pay | Admitting: *Deleted

## 2013-07-15 ENCOUNTER — Encounter (HOSPITAL_COMMUNITY): Payer: Medicaid Other | Admitting: Anesthesiology

## 2013-07-15 ENCOUNTER — Encounter (HOSPITAL_COMMUNITY)
Admission: RE | Disposition: A | Discharge status: Home Health | Payer: Self-pay | Source: Ambulatory Visit | Attending: Orthopedic Surgery

## 2013-07-15 ENCOUNTER — Inpatient Hospital Stay (HOSPITAL_COMMUNITY)
Admission: RE | Admit: 2013-07-15 | Discharge: 2013-07-16 | DRG: 470 | Disposition: A | Discharge status: Home Health | Payer: Medicaid Other | Source: Ambulatory Visit | Attending: Orthopedic Surgery | Admitting: Orthopedic Surgery

## 2013-07-15 DIAGNOSIS — Z8711 Personal history of peptic ulcer disease: Secondary | ICD-10-CM

## 2013-07-15 DIAGNOSIS — E663 Overweight: Secondary | ICD-10-CM | POA: Diagnosis present

## 2013-07-15 DIAGNOSIS — K219 Gastro-esophageal reflux disease without esophagitis: Secondary | ICD-10-CM | POA: Diagnosis present

## 2013-07-15 DIAGNOSIS — F411 Generalized anxiety disorder: Secondary | ICD-10-CM | POA: Diagnosis present

## 2013-07-15 DIAGNOSIS — Z6826 Body mass index (BMI) 26.0-26.9, adult: Secondary | ICD-10-CM

## 2013-07-15 DIAGNOSIS — I10 Essential (primary) hypertension: Secondary | ICD-10-CM | POA: Diagnosis present

## 2013-07-15 DIAGNOSIS — D62 Acute posthemorrhagic anemia: Secondary | ICD-10-CM | POA: Diagnosis not present

## 2013-07-15 DIAGNOSIS — D500 Iron deficiency anemia secondary to blood loss (chronic): Secondary | ICD-10-CM | POA: Diagnosis not present

## 2013-07-15 DIAGNOSIS — Z96652 Presence of left artificial knee joint: Secondary | ICD-10-CM

## 2013-07-15 DIAGNOSIS — Z8249 Family history of ischemic heart disease and other diseases of the circulatory system: Secondary | ICD-10-CM

## 2013-07-15 DIAGNOSIS — E7800 Pure hypercholesterolemia, unspecified: Secondary | ICD-10-CM | POA: Diagnosis present

## 2013-07-15 DIAGNOSIS — M1710 Unilateral primary osteoarthritis, unspecified knee: Principal | ICD-10-CM | POA: Diagnosis present

## 2013-07-15 DIAGNOSIS — R002 Palpitations: Secondary | ICD-10-CM | POA: Diagnosis present

## 2013-07-15 DIAGNOSIS — F319 Bipolar disorder, unspecified: Secondary | ICD-10-CM | POA: Diagnosis present

## 2013-07-15 HISTORY — PX: PARTIAL KNEE ARTHROPLASTY: SHX2174

## 2013-07-15 LAB — TYPE AND SCREEN
ABO/RH(D): O POS
Antibody Screen: NEGATIVE

## 2013-07-15 LAB — URINALYSIS, ROUTINE W REFLEX MICROSCOPIC
BILIRUBIN URINE: NEGATIVE
GLUCOSE, UA: NEGATIVE mg/dL
HGB URINE DIPSTICK: NEGATIVE
KETONES UR: NEGATIVE mg/dL
Nitrite: NEGATIVE
PH: 5.5 (ref 5.0–8.0)
Protein, ur: NEGATIVE mg/dL
SPECIFIC GRAVITY, URINE: 1.029 (ref 1.005–1.030)
Urobilinogen, UA: 1 mg/dL (ref 0.0–1.0)

## 2013-07-15 LAB — URINE MICROSCOPIC-ADD ON

## 2013-07-15 SURGERY — ARTHROPLASTY, KNEE, UNICOMPARTMENTAL
Anesthesia: Spinal | Site: Knee | Laterality: Left

## 2013-07-15 MED ORDER — PROPOFOL INFUSION 10 MG/ML OPTIME
INTRAVENOUS | Status: DC | PRN
  Administered 2013-07-15: 100 ug/kg/min via INTRAVENOUS

## 2013-07-15 MED ORDER — PHENYLEPHRINE 40 MCG/ML (10ML) SYRINGE FOR IV PUSH (FOR BLOOD PRESSURE SUPPORT)
PREFILLED_SYRINGE | INTRAVENOUS | Status: AC
  Filled 2013-07-15: qty 10

## 2013-07-15 MED ORDER — CHLORHEXIDINE GLUCONATE 4 % EX LIQD: 60.0000 mL | Freq: Once | CUTANEOUS | Status: DC

## 2013-07-15 MED ORDER — PHENYLEPHRINE HCL 10 MG/ML IJ SOLN
INTRAMUSCULAR | Status: DC | PRN
  Administered 2013-07-15: 40 ug via INTRAVENOUS

## 2013-07-15 MED ORDER — SODIUM CHLORIDE 0.9 % IJ SOLN
INTRAMUSCULAR | Status: AC
  Filled 2013-07-15: qty 10

## 2013-07-15 MED ORDER — MIDAZOLAM HCL 2 MG/2ML IJ SOLN
INTRAMUSCULAR | Status: AC
  Filled 2013-07-15: qty 2

## 2013-07-15 MED ORDER — BISACODYL 10 MG RE SUPP: 10.0000 mg | Freq: Every day | RECTAL | Status: DC | PRN

## 2013-07-15 MED ORDER — HYDROMORPHONE HCL PF 1 MG/ML IJ SOLN
0.5000 mg | INTRAMUSCULAR | Status: DC | PRN
  Administered 2013-07-15 (×2): 1 mg via INTRAVENOUS
  Filled 2013-07-15 (×3): qty 1

## 2013-07-15 MED ORDER — KETOROLAC TROMETHAMINE 15 MG/ML IJ SOLN
INTRAMUSCULAR | Status: DC | PRN
  Administered 2013-07-15: 30 mg

## 2013-07-15 MED ORDER — ASPIRIN EC 325 MG PO TBEC
325.0000 mg | DELAYED_RELEASE_TABLET | Freq: Two times a day (BID) | ORAL | Status: DC
  Administered 2013-07-16: 325 mg via ORAL
  Filled 2013-07-15 (×3): qty 1

## 2013-07-15 MED ORDER — POLYETHYLENE GLYCOL 3350 17 G PO PACK
17.0000 g | PACK | Freq: Two times a day (BID) | ORAL | Status: DC
  Administered 2013-07-15: 17 g via ORAL

## 2013-07-15 MED ORDER — LACTATED RINGERS IV SOLN
INTRAVENOUS | Status: DC | PRN
  Administered 2013-07-15 (×2): via INTRAVENOUS

## 2013-07-15 MED ORDER — BUPIVACAINE-EPINEPHRINE PF 0.25-1:200000 % IJ SOLN
INTRAMUSCULAR | Status: AC
  Filled 2013-07-15: qty 30

## 2013-07-15 MED ORDER — MENTHOL 3 MG MT LOZG: 1.0000 | LOZENGE | OROMUCOSAL | Status: DC | PRN

## 2013-07-15 MED ORDER — METOCLOPRAMIDE HCL 10 MG PO TABS: 5.0000 mg | ORAL_TABLET | Freq: Three times a day (TID) | ORAL | Status: DC | PRN

## 2013-07-15 MED ORDER — HYDROMORPHONE HCL PF 1 MG/ML IJ SOLN: 0.2500 mg | INTRAMUSCULAR | Status: DC | PRN

## 2013-07-15 MED ORDER — SODIUM CHLORIDE 0.9 % IJ SOLN
INTRAMUSCULAR | Status: DC | PRN
  Administered 2013-07-15: 14 mL

## 2013-07-15 MED ORDER — ONDANSETRON HCL 4 MG/2ML IJ SOLN: 4.0000 mg | Freq: Four times a day (QID) | INTRAMUSCULAR | Status: DC | PRN

## 2013-07-15 MED ORDER — EPHEDRINE SULFATE 50 MG/ML IJ SOLN
INTRAMUSCULAR | Status: AC
  Filled 2013-07-15: qty 1

## 2013-07-15 MED ORDER — ALPRAZOLAM 1 MG PO TABS
2.0000 mg | ORAL_TABLET | Freq: Three times a day (TID) | ORAL | Status: DC | PRN
  Administered 2013-07-16: 2 mg via ORAL
  Filled 2013-07-15: qty 2

## 2013-07-15 MED ORDER — CEFAZOLIN SODIUM-DEXTROSE 2-3 GM-% IV SOLR
2.0000 g | Freq: Four times a day (QID) | INTRAVENOUS | Status: AC
  Administered 2013-07-15 (×2): 2 g via INTRAVENOUS
  Filled 2013-07-15 (×2): qty 50

## 2013-07-15 MED ORDER — DIPHENHYDRAMINE HCL 25 MG PO CAPS: 25.0000 mg | ORAL_CAPSULE | Freq: Four times a day (QID) | ORAL | Status: DC | PRN

## 2013-07-15 MED ORDER — KETAMINE HCL 10 MG/ML IJ SOLN
INTRAMUSCULAR | Status: AC
  Filled 2013-07-15: qty 1

## 2013-07-15 MED ORDER — SODIUM CHLORIDE 0.9 % IJ SOLN
INTRAMUSCULAR | Status: AC
  Filled 2013-07-15: qty 50

## 2013-07-15 MED ORDER — BUPIVACAINE LIPOSOME 1.3 % IJ SUSP
20.0000 mL | Freq: Once | INTRAMUSCULAR | Status: AC
  Administered 2013-07-15: 20 mL
  Filled 2013-07-15: qty 20

## 2013-07-15 MED ORDER — ONDANSETRON HCL 4 MG/2ML IJ SOLN
INTRAMUSCULAR | Status: DC | PRN
  Administered 2013-07-15: 4 mg via INTRAVENOUS

## 2013-07-15 MED ORDER — CEFAZOLIN SODIUM-DEXTROSE 2-3 GM-% IV SOLR
2.0000 g | INTRAVENOUS | Status: AC
  Administered 2013-07-15: 2 g via INTRAVENOUS

## 2013-07-15 MED ORDER — ALUM & MAG HYDROXIDE-SIMETH 200-200-20 MG/5ML PO SUSP: 30.0000 mL | ORAL | Status: DC | PRN

## 2013-07-15 MED ORDER — BUPIVACAINE-EPINEPHRINE 0.25% -1:200000 IJ SOLN
INTRAMUSCULAR | Status: DC | PRN
  Administered 2013-07-15: 25 mL

## 2013-07-15 MED ORDER — DOCUSATE SODIUM 100 MG PO CAPS
100.0000 mg | ORAL_CAPSULE | Freq: Two times a day (BID) | ORAL | Status: DC
  Administered 2013-07-15 – 2013-07-16 (×2): 100 mg via ORAL

## 2013-07-15 MED ORDER — CELECOXIB 200 MG PO CAPS
200.0000 mg | ORAL_CAPSULE | Freq: Two times a day (BID) | ORAL | Status: DC
  Administered 2013-07-15 – 2013-07-16 (×2): 200 mg via ORAL
  Filled 2013-07-15 (×4): qty 1

## 2013-07-15 MED ORDER — FERROUS SULFATE 325 (65 FE) MG PO TABS
325.0000 mg | ORAL_TABLET | Freq: Three times a day (TID) | ORAL | Status: DC
  Administered 2013-07-16: 325 mg via ORAL
  Filled 2013-07-15 (×6): qty 1

## 2013-07-15 MED ORDER — DEXAMETHASONE SODIUM PHOSPHATE 10 MG/ML IJ SOLN
10.0000 mg | Freq: Once | INTRAMUSCULAR | Status: AC
  Administered 2013-07-15: 10 mg via INTRAVENOUS

## 2013-07-15 MED ORDER — DEXAMETHASONE SODIUM PHOSPHATE 10 MG/ML IJ SOLN
INTRAMUSCULAR | Status: AC
  Filled 2013-07-15: qty 1

## 2013-07-15 MED ORDER — LACTATED RINGERS IV SOLN
INTRAVENOUS | Status: DC
  Administered 2013-07-15: 1000 mL via INTRAVENOUS

## 2013-07-15 MED ORDER — PANTOPRAZOLE SODIUM 40 MG PO TBEC
40.0000 mg | DELAYED_RELEASE_TABLET | Freq: Every day | ORAL | Status: DC
Start: 2013-07-15 — End: 2013-07-16
  Administered 2013-07-15 – 2013-07-16 (×2): 40 mg via ORAL
  Filled 2013-07-15 (×2): qty 1

## 2013-07-15 MED ORDER — PROPOFOL 10 MG/ML IV BOLUS
INTRAVENOUS | Status: AC
  Filled 2013-07-15: qty 20

## 2013-07-15 MED ORDER — SIMVASTATIN 20 MG PO TABS
20.0000 mg | ORAL_TABLET | Freq: Every evening | ORAL | Status: DC
  Administered 2013-07-15: 20 mg via ORAL
  Filled 2013-07-15 (×2): qty 1

## 2013-07-15 MED ORDER — SODIUM CHLORIDE 0.9 % IV SOLN
INTRAVENOUS | Status: DC
  Administered 2013-07-15 (×2): via INTRAVENOUS
  Filled 2013-07-15 (×5): qty 1000

## 2013-07-15 MED ORDER — CEFAZOLIN SODIUM-DEXTROSE 2-3 GM-% IV SOLR
INTRAVENOUS | Status: AC
  Filled 2013-07-15: qty 50

## 2013-07-15 MED ORDER — METHOCARBAMOL 100 MG/ML IJ SOLN
500.0000 mg | Freq: Four times a day (QID) | INTRAVENOUS | Status: DC | PRN
  Filled 2013-07-15: qty 5

## 2013-07-15 MED ORDER — TRANEXAMIC ACID 100 MG/ML IV SOLN
1000.0000 mg | Freq: Once | INTRAVENOUS | Status: AC
  Administered 2013-07-15: 1000 mg via INTRAVENOUS
  Filled 2013-07-15: qty 10

## 2013-07-15 MED ORDER — 0.9 % SODIUM CHLORIDE (POUR BTL) OPTIME
TOPICAL | Status: DC | PRN
  Administered 2013-07-15: 1000 mL

## 2013-07-15 MED ORDER — HYOSCYAMINE SULFATE 0.125 MG PO TABS
0.1250 mg | ORAL_TABLET | ORAL | Status: DC | PRN
  Filled 2013-07-15: qty 1

## 2013-07-15 MED ORDER — FLEET ENEMA 7-19 GM/118ML RE ENEM: 1.0000 | ENEMA | Freq: Once | RECTAL | Status: AC | PRN

## 2013-07-15 MED ORDER — MIDAZOLAM HCL 5 MG/5ML IJ SOLN
INTRAMUSCULAR | Status: DC | PRN
  Administered 2013-07-15: 1 mg via INTRAVENOUS
  Administered 2013-07-15: 2 mg via INTRAVENOUS
  Administered 2013-07-15: 1 mg via INTRAVENOUS

## 2013-07-15 MED ORDER — DEXAMETHASONE SODIUM PHOSPHATE 10 MG/ML IJ SOLN
10.0000 mg | Freq: Once | INTRAMUSCULAR | Status: DC
  Filled 2013-07-15: qty 1

## 2013-07-15 MED ORDER — ONDANSETRON HCL 4 MG PO TABS: 4.0000 mg | ORAL_TABLET | Freq: Four times a day (QID) | ORAL | Status: DC | PRN

## 2013-07-15 MED ORDER — KETAMINE HCL 10 MG/ML IJ SOLN
INTRAMUSCULAR | Status: DC | PRN
  Administered 2013-07-15 (×4): 5 mg via INTRAVENOUS

## 2013-07-15 MED ORDER — HYDROCODONE-ACETAMINOPHEN 7.5-325 MG PO TABS
1.0000 | ORAL_TABLET | ORAL | Status: DC
  Administered 2013-07-15 (×2): 2 via ORAL
  Administered 2013-07-15: 1 via ORAL
  Administered 2013-07-15 – 2013-07-16 (×4): 2 via ORAL
  Filled 2013-07-15 (×2): qty 2
  Filled 2013-07-15: qty 1
  Filled 2013-07-15 (×4): qty 2

## 2013-07-15 MED ORDER — LIDOCAINE HCL (CARDIAC) 20 MG/ML IV SOLN
INTRAVENOUS | Status: AC
  Filled 2013-07-15: qty 5

## 2013-07-15 MED ORDER — KETOROLAC TROMETHAMINE 30 MG/ML IJ SOLN
INTRAMUSCULAR | Status: AC
  Filled 2013-07-15: qty 1

## 2013-07-15 MED ORDER — PHENOL 1.4 % MT LIQD: 1.0000 | OROMUCOSAL | Status: DC | PRN

## 2013-07-15 MED ORDER — AMPHETAMINE-DEXTROAMPHETAMINE 10 MG PO TABS
40.0000 mg | ORAL_TABLET | Freq: Every day | ORAL | Status: DC
  Administered 2013-07-15 – 2013-07-16 (×2): 40 mg via ORAL
  Filled 2013-07-15 (×2): qty 4

## 2013-07-15 MED ORDER — METHOCARBAMOL 500 MG PO TABS
500.0000 mg | ORAL_TABLET | Freq: Four times a day (QID) | ORAL | Status: DC | PRN
  Administered 2013-07-15 – 2013-07-16 (×2): 500 mg via ORAL
  Filled 2013-07-15 (×2): qty 1

## 2013-07-15 MED ORDER — ONDANSETRON HCL 4 MG/2ML IJ SOLN
INTRAMUSCULAR | Status: AC
  Filled 2013-07-15: qty 2

## 2013-07-15 MED ORDER — BUPIVACAINE HCL (PF) 0.75 % IJ SOLN
INTRAMUSCULAR | Status: DC | PRN
  Administered 2013-07-15: 15 mg via INTRATHECAL

## 2013-07-15 MED ORDER — METOCLOPRAMIDE HCL 5 MG/ML IJ SOLN
5.0000 mg | Freq: Three times a day (TID) | INTRAMUSCULAR | Status: DC | PRN
  Administered 2013-07-15: 10 mg via INTRAVENOUS
  Filled 2013-07-15: qty 2

## 2013-07-15 SURGICAL SUPPLY — 50 items
BAG ZIPLOCK 12X15 (MISCELLANEOUS) ×3 IMPLANT
BANDAGE ELASTIC 6 VELCRO ST LF (GAUZE/BANDAGES/DRESSINGS) ×3 IMPLANT
BANDAGE ESMARK 6X9 LF (GAUZE/BANDAGES/DRESSINGS) ×1 IMPLANT
BLADE SAW RECIPROCATING 77.5 (BLADE) ×3 IMPLANT
BLADE SAW SGTL 13.0X1.19X90.0M (BLADE) ×3 IMPLANT
BNDG ESMARK 6X9 LF (GAUZE/BANDAGES/DRESSINGS) ×3
BOWL SMART MIX CTS (DISPOSABLE) ×3 IMPLANT
CAPT KNEE OXFORD ×3 IMPLANT
CEMENT HV SMART SET (Cement) ×3 IMPLANT
CUFF TOURN SGL QUICK 34 (TOURNIQUET CUFF) ×2
CUFF TRNQT CYL 34X4X40X1 (TOURNIQUET CUFF) ×1 IMPLANT
DERMABOND ADVANCED (GAUZE/BANDAGES/DRESSINGS) ×2
DERMABOND ADVANCED .7 DNX12 (GAUZE/BANDAGES/DRESSINGS) ×1 IMPLANT
DRAPE EXTREMITY T 121X128X90 (DRAPE) ×3 IMPLANT
DRAPE POUCH INSTRU U-SHP 10X18 (DRAPES) ×3 IMPLANT
DRAPE U-SHAPE 47X51 STRL (DRAPES) ×3 IMPLANT
DRSG AQUACEL AG ADV 3.5X10 (GAUZE/BANDAGES/DRESSINGS) ×3 IMPLANT
DRSG TEGADERM 4X4.75 (GAUZE/BANDAGES/DRESSINGS) IMPLANT
DURAPREP 26ML APPLICATOR (WOUND CARE) ×6 IMPLANT
ELECT REM PT RETURN 9FT ADLT (ELECTROSURGICAL) ×3
ELECTRODE REM PT RTRN 9FT ADLT (ELECTROSURGICAL) ×1 IMPLANT
EVACUATOR 1/8 PVC DRAIN (DRAIN) ×3 IMPLANT
FACESHIELD LNG OPTICON STERILE (SAFETY) ×12 IMPLANT
GAUZE SPONGE 2X2 8PLY STRL LF (GAUZE/BANDAGES/DRESSINGS) IMPLANT
GLOVE BIOGEL PI IND STRL 7.5 (GLOVE) ×1 IMPLANT
GLOVE BIOGEL PI IND STRL 8 (GLOVE) ×1 IMPLANT
GLOVE BIOGEL PI INDICATOR 7.5 (GLOVE) ×2
GLOVE BIOGEL PI INDICATOR 8 (GLOVE) ×2
GLOVE ECLIPSE 8.0 STRL XLNG CF (GLOVE) ×3 IMPLANT
GLOVE ORTHO TXT STRL SZ7.5 (GLOVE) ×6 IMPLANT
GOWN SPEC L3 XXLG W/TWL (GOWN DISPOSABLE) ×3 IMPLANT
GOWN STRL REUS W/TWL LRG LVL3 (GOWN DISPOSABLE) ×3 IMPLANT
KIT BASIN OR (CUSTOM PROCEDURE TRAY) ×3 IMPLANT
LEGGING LITHOTOMY PAIR STRL (DRAPES) ×3 IMPLANT
MANIFOLD NEPTUNE II (INSTRUMENTS) ×3 IMPLANT
NDL SAFETY ECLIPSE 18X1.5 (NEEDLE) ×1 IMPLANT
NEEDLE HYPO 18GX1.5 SHARP (NEEDLE) ×2
PACK TOTAL JOINT (CUSTOM PROCEDURE TRAY) ×3 IMPLANT
POSITIONER SURGICAL ARM (MISCELLANEOUS) ×3 IMPLANT
SPONGE GAUZE 2X2 STER 10/PKG (GAUZE/BANDAGES/DRESSINGS)
SUCTION FRAZIER TIP 10 FR DISP (SUCTIONS) ×3 IMPLANT
SUT MNCRL AB 4-0 PS2 18 (SUTURE) ×3 IMPLANT
SUT VIC AB 1 CT1 36 (SUTURE) ×3 IMPLANT
SUT VIC AB 2-0 CT1 27 (SUTURE) ×4
SUT VIC AB 2-0 CT1 TAPERPNT 27 (SUTURE) ×2 IMPLANT
SUT VLOC 180 0 24IN GS25 (SUTURE) ×3 IMPLANT
SYR 50ML LL SCALE MARK (SYRINGE) ×3 IMPLANT
TOWEL OR 17X26 10 PK STRL BLUE (TOWEL DISPOSABLE) ×3 IMPLANT
TOWEL OR NON WOVEN STRL DISP B (DISPOSABLE) IMPLANT
TRAY FOLEY CATH 14FRSI W/METER (CATHETERS) IMPLANT

## 2013-07-15 NOTE — Op Note (Signed)
NAME: Barnard RECORD NO.: [id number]   FACILITY: [facility] BIRTH: 09/19/1955  PHYSICIAN: Pietro Cassis. Alvan Dame, M.D.    DATE OF PROCEDURE: 07/15/2013    OPERATIVE REPORT   PREOPERATIVE DIAGNOSIS: Left knee medial compartment osteoarthritis.   POSTOPERATIVE DIAGNOSIS: Left knee medial compartment osteoarthritis.  PROCEDURE: Left partial knee replacement utilizing Biomet Oxford knee  component, size X-small femur, a left medial size AA tibial tray with a size 4 insert.   SURGEON: Pietro Cassis. Alvan Dame, M.D.   ASSISTANT: Danae Orleans, PAC.  Please note that Mr. Guinevere Scarlet was present for the entirety of the case,  utilized for preoperative positioning, perioperative retractor  management, general facilitation of the case and primary wound closure.   ANESTHESIA: Spinal.   SPECIMENS: None.   COMPLICATIONS: None.  DRAINS: None   TOURNIQUET TIME: 36 minutes at 250 mmHg.   INDICATIONS FOR PROCEDURE: The patient is a 58 yo patient of mine who presented for evaluation of left knee pain.  They presented with primary complaints of pain on the medial side of their knee. Radiographs revealed advanced medial compartment arthritis with specifically an antero-medial wear pattern.  There was bone on bone changes noted with subchondral sclerosis and osteophytes present. The patient has had progressive problems failing to respond to conservative measures of medications, injections and activity modification. Risks of infection, DVT, component failure, need for future revision surgery were all discussed and reviewed.  Consent was obtained for benefit of pain relief.   PROCEDURE IN DETAIL: The patient was brought to the operative theater.  Once adequate anesthesia, preoperative antibiotics, 2gm Ancef administered, the patient was positioned in supine position with a left thigh tourniquet  placed. The left lower extremity was prepped and draped in sterile  fashion with the leg on the Oxford  leg holder.  The leg was allowed to flex to 120 degrees. A time-out  was performed identifying the patient, planned procedure, and extremity.  The leg was exsanguinated, tourniquet elevated to 250 mmHg. A midline  incision was made from the proximal pole of the patella to the tibial tubercle. A  soft tissue plane was created and partial median arthrotomy was then  made to allow for subluxation of the patella. Following initial synovectomy and  debridement, the osteophytes were removed off the medial aspect of the  knee.   Attention was first directed to the tibia. The tibial  extramedullary guide was positioned over the anterior crest of the tibia  and pinned into position, and using a measured resection guide from the  [place] system, a 4 mm resection was made off the proximal tibia. First  the reciprocating saw along the medial aspect of the tibial spines, then the oscillating saw.    At this point, I sized this cut surface seem to be best fit for a size AA tibial tray.  With the retractors out of the wound and the knee held at 90 degrees the size 4 feeler gauge had appropriate tension on the medial ligament.   At this point, the femoral canal was opened with a drill and the  intramedullary rod passed. Then using the guide for a X-small posterior resection off  the posterior aspect of the femur was positioned over the mid portion of the medial femoral condyle.  The orientation was set using the guide that mates the femoral guide to the intramedullary rod.  The 2 drill holes were made into the distal femur.  The posterior guide was  then impacted into place and the posterior  femoral cut made.  At this point, I milled the distal femur with a size 4 spigot in place. At this point, we did a trial reduction of the X-small femur, size AA tibial tray and a size 4 feeler gauge. At 90 degrees of  flexion and at 20 degrees of flexion the knee had symmetric tension on  the ligaments.   Given these  findings, the trial femoral component was removed. Final preparation of tibia was carried out by pinning it in position. Then  using a reciprocating saw I removed bone for the keel. Further bone was  removed with an osteotome.  Trial reduction was now carried out with the X-small femur, the AA bia, and a size 4 lollipop insert. The balance of the  ligaments appeared to be symmetric at 20 degrees and 90 degrees. Given  all these findings, the trial components were removed.   Cement was mixed. The final components were opened. The knee was irrigated with  normal saline solution. Then final debridements of the  soft tissue was carried out, I also drilled the sclerotic bone with a drill.  The final components were cemented with a single batch of cement in a  two-stage technique with the tibial component cemented first. The knee  was then brought  to 45 degrees of flexion with a size 4 feeler gauge, held with pressure for a minute and half.  After this the femoral component was cemented in place.  The knee was again held at 45 degrees of flexion while the cement fully cured.  Excess cement was removed throughout the knee. Tourniquet was let down  after 36 minutes. After the cement had fully cured and excessive cement  was removed throughout the knee there was no visualized cement present.   The final left medial size 4 X-small insert was chosen and snapped into position. We re-irrigated  the knee. The extensor mechanism  was then reapproximated using a #1 Vicryl and #0 V-lock sutures with the knee in flexion. The  remaining wound was closed with 2-0 Vicryl and a running 4-0 Monocryl.  The knee was cleaned, dried, and dressed sterilely using Dermabond and  Aquacel dressing. The patient  was brought to the recovery room, Ace wrap in place, tolerating the  procedure well. He will be in the hospital for overnight observation.  We will initiate physical therapy and progress to ambulate.      Pietro Cassis Alvan Dame, M.D.

## 2013-07-15 NOTE — Anesthesia Postprocedure Evaluation (Signed)
  Anesthesia Post-op Note  Patient: [name]  Procedure(s) Performed: Procedure(s) (LRB): UNICOMPARTMENTAL LEFT KNEE MEDIAL  (Left)  Patient Location: PACU  Anesthesia Type: Spinal  Level of Consciousness: awake and alert   Airway and Oxygen Therapy: Patient Spontanous Breathing  Post-op Pain: mild  Post-op Assessment: Post-op Vital signs reviewed, Patient's Cardiovascular Status Stable, Respiratory Function Stable, Patent Airway and No signs of Nausea or vomiting  Last Vitals:  Filed Vitals:   07/15/13 1015  BP: 114/76  Pulse: 73  Temp: 36.5 C  Resp: 17    Post-op Vital Signs: stable   Complications: No apparent anesthesia complications

## 2013-07-15 NOTE — Transfer of Care (Signed)
Immediate Anesthesia Transfer of Care Note  Patient: [name]  Procedure(s) Performed: Procedure(s): UNICOMPARTMENTAL LEFT KNEE MEDIAL  (Left)  Patient Location: PACU  Anesthesia Type:Spinal  Level of Consciousness: awake, alert  and oriented  Airway & Oxygen Therapy: Patient Spontanous Breathing and Patient connected to face mask oxygen  Post-op Assessment: Report given to PACU RN and Post -op Vital signs reviewed and stable  Post vital signs: Reviewed and stable  Complications: No apparent anesthesia complications

## 2013-07-15 NOTE — Addendum Note (Signed)
Addendum created 07/15/13 1057 by Glory Buff, CRNA   Modules edited: Charges VN

## 2013-07-15 NOTE — Preoperative (Signed)
Beta Blockers   Reason not to administer Beta Blockers:Not Applicable 

## 2013-07-15 NOTE — Evaluation (Addendum)
Physical Therapy Evaluation Patient Details Name: [name] MRN: [id number] DOB: 08/21/1955 Today's Date: 07/15/2013 Time: 9211-9417 PT Time Calculation (min): 26 min  PT Assessment / Plan / Recommendation History of Present Illness  L UKR  Clinical Impression  Pt will benefit from PT to address deficits below    PT Assessment  Patient needs continued PT services    Follow Up Recommendations  Home health PT    Does the patient have the potential to tolerate intense rehabilitation      Barriers to Discharge        Equipment Recommendations  Other (comment) (already delivered)    Recommendations for Other Services     Frequency 7X/week    Precautions / Restrictions Precautions Precautions: Knee   Pertinent Vitals/Pain VSS, pain 5/10, premedicated and ice to knee      Mobility  Bed Mobility Overal bed mobility: Needs Assistance Bed Mobility: Supine to Sit Supine to sit: Supervision General bed mobility comments: verbal cues for technique, self assist  Transfers Overall transfer level: Needs assistance Equipment used: Rolling walker (2 wheeled) Transfers: Sit to/from Stand Sit to Stand: Min assist;Min guard General transfer comment: verbal cues or hand placment and wt shift Ambulation/Gait Ambulation/Gait assistance: Min assist;Min guard Ambulation Distance (Feet): 50 Feet Assistive device: Rolling walker (2 wheeled) Gait Pattern/deviations: Step-to pattern;Step-through pattern;Antalgic Gait velocity: decr General Gait Details: cues for sequence, RW position  Ankle pumps and quad sets x 10 bil    Exercises     PT Diagnosis: Difficulty walking  PT Problem List: Decreased strength;Decreased range of motion;Decreased activity tolerance;Decreased mobility;Decreased balance PT Treatment Interventions: DME instruction;Gait training;Functional mobility training;Therapeutic activities;Therapeutic exercise;Patient/family education;Stair training     PT  Goals(Current goals can be found in the care plan section) Acute Rehab PT Goals Patient Stated Goal: I, get other knee done PT Goal Formulation: With patient Time For Goal Achievement: 07/22/13 Potential to Achieve Goals: Good  Visit Information  Last PT Received On: 07/15/13 Assistance Needed: +1 History of Present Illness: L UKR       Prior [place] expects to be discharged to:: Private residence Living Arrangements: Alone Available Help at Discharge: Family Type of Home: Mobile home Home Access: Stairs to enter Entrance Stairs-Number of Steps: 2 separated steps  Home Layout: One Ingram: Environmental consultant - 2 wheels;Bedside commode;Tub bench Additional Comments: DME delivered to room Prior Function Level of Independence: Independent Communication Communication: No difficulties    Cognition  Cognition Arousal/Alertness: Awake/alert Behavior During Therapy: WFL for tasks assessed/performed Overall Cognitive Status: Within Functional Limits for tasks assessed    Extremity/Trunk Assessment Upper Extremity Assessment Upper Extremity Assessment: Overall WFL for tasks assessed Lower Extremity Assessment Lower Extremity Assessment: LLE deficits/detail LLE Deficits / Details: able to do SLR   Balance    End of Session PT - End of Session Activity Tolerance: Patient tolerated treatment well Patient left: in chair;with call bell/phone within reach;with family/visitor present Nurse Communication: Mobility status  GP     [facility] 07/15/2013, 4:54 PM

## 2013-07-15 NOTE — Anesthesia Procedure Notes (Signed)
Spinal  Patient location during procedure: OR Start time: 07/15/2013 7:25 AM End time: 07/15/2013 7:30 AM Staffing Anesthesiologist: Rod Mae L Performed by: anesthesiologist  Preanesthetic Checklist Completed: patient identified, site marked, surgical consent, pre-op evaluation, timeout performed, IV checked, risks and benefits discussed and monitors and equipment checked Spinal Block Patient position: sitting Prep: Betadine Patient monitoring: heart rate, continuous pulse ox and blood pressure Approach: midline Location: L3-4 Injection technique: single-shot Needle Needle type: [place]  Needle gauge: 25 G Needle length: 9 cm Assessment Sensory level: T6 Additional Notes Expiration date of kit checked and confirmed. Patient tolerated procedure well, without complications.

## 2013-07-15 NOTE — Progress Notes (Signed)
   CARE MANAGEMENT NOTE 07/15/2013  Patient:  JHADA, RISK   Account Number:  [id number]  Date Initiated:  07/15/2013  Documentation initiated by:  [facility]  Subjective/Objective Assessment:   UNICOMPARTMENTAL LEFT KNEE MEDIAL     Action/Plan:   lives at home alone   Anticipated DC Date:  07/17/2013   Anticipated DC Plan:  [place]  CM consult      [facility] Choice  HOME HEALTH   Choice offered to / List presented to:  C-1 Patient   DME arranged  3-N-1  [place]      DME agency  [place] arranged  [place].   Status of service:  Completed, signed off Medicare Important Message given?   (If response is "NO", the following Medicare IM given date fields will be blank) Date Medicare IM given:   Date Additional Medicare IM given:    Discharge Disposition:  [place]  Per UR Regulation:    If discussed at Long Length of Stay Meetings, dates discussed:    Comments:  07/15/2013 1500 NCM spoke to pt and offered choice for [facility]. Pt agreeable to [facility] for HH. State she has a friend that has dicussed with her personal care services. States she will need assistance at home, she lives alone. Pt is requesting RW, 3n1 and tub bench for home. Jonnie Finner RN CCM Case Mgmt phone [phone]

## 2013-07-15 NOTE — Interval H&P Note (Signed)
History and Physical Interval Note:  07/15/2013 6:53 AM  [name]  has presented today for surgery, with the diagnosis of left knee osteoarthritis  The various methods of treatment have been discussed with the patient and family. After consideration of risks, benefits and other options for treatment, the patient has consented to  Procedure(s): UNICOMPARTMENTAL LEFT KNEE MEDIAL  (Left) as a surgical intervention .  The patient's history has been reviewed, patient examined, no change in status, stable for surgery.  I have reviewed the patient's chart and labs.  Questions were answered to the patient's satisfaction.     [name]

## 2013-07-16 DIAGNOSIS — E663 Overweight: Secondary | ICD-10-CM | POA: Diagnosis present

## 2013-07-16 DIAGNOSIS — D500 Iron deficiency anemia secondary to blood loss (chronic): Secondary | ICD-10-CM | POA: Diagnosis not present

## 2013-07-16 LAB — CBC
HCT: 27.5 % — ABNORMAL LOW (ref 36.0–46.0)
HEMOGLOBIN: 9.4 g/dL — AB (ref 12.0–15.0)
MCH: 29.7 pg (ref 26.0–34.0)
MCHC: 34.2 g/dL (ref 30.0–36.0)
MCV: 86.8 fL (ref 78.0–100.0)
Platelets: 164 10*3/uL (ref 150–400)
RBC: 3.17 MIL/uL — ABNORMAL LOW (ref 3.87–5.11)
RDW: 12.6 % (ref 11.5–15.5)
WBC: 11.1 10*3/uL — ABNORMAL HIGH (ref 4.0–10.5)

## 2013-07-16 LAB — BASIC METABOLIC PANEL
BUN: 12 mg/dL (ref 6–23)
CO2: 25 mEq/L (ref 19–32)
Calcium: 8.5 mg/dL (ref 8.4–10.5)
Chloride: 108 mEq/L (ref 96–112)
Creatinine, Ser: 0.76 mg/dL (ref 0.50–1.10)
GFR calc Af Amer: >90 mL/min (ref >90)
Glucose, Bld: 117 mg/dL — ABNORMAL HIGH (ref 70–99)
Potassium: 3.9 mEq/L (ref 3.7–5.3)
Sodium: 142 mEq/L (ref 137–147)

## 2013-07-16 MED ORDER — METHOCARBAMOL 500 MG PO TABS: 500.0000 mg | ORAL_TABLET | Freq: Four times a day (QID) | ORAL | Status: DC | PRN

## 2013-07-16 MED ORDER — DSS 100 MG PO CAPS: 100.0000 mg | ORAL_CAPSULE | Freq: Two times a day (BID) | ORAL | Status: DC

## 2013-07-16 MED ORDER — POLYETHYLENE GLYCOL 3350 17 G PO PACK: 17.0000 g | PACK | Freq: Two times a day (BID) | ORAL | Status: DC

## 2013-07-16 MED ORDER — HYDROCODONE-ACETAMINOPHEN 7.5-325 MG PO TABS: 1.0000 | ORAL_TABLET | ORAL | Status: DC | PRN

## 2013-07-16 MED ORDER — ASPIRIN 325 MG PO TBEC: 325.0000 mg | DELAYED_RELEASE_TABLET | Freq: Two times a day (BID) | ORAL | Status: AC

## 2013-07-16 MED ORDER — FERROUS SULFATE 325 (65 FE) MG PO TABS: 325.0000 mg | ORAL_TABLET | Freq: Three times a day (TID) | ORAL | Status: DC

## 2013-07-16 NOTE — Progress Notes (Signed)
   Subjective: 1 Day Post-Op Procedure(s) (LRB): UNICOMPARTMENTAL LEFT KNEE MEDIAL  (Left)   Patient reports pain as mild, pain controlled. No events throughout the night. Ready to be discharged home, if she does well with OPT and pain stays controlled.   Objective:   VITALS:   Filed Vitals:   07/16/13 0549  BP: 98/60  Pulse: 65  Temp: 98.5 F (36.9 C)  Resp: 16    Neurovascular intact Dorsiflexion/Plantar flexion intact Incision: dressing C/D/I No cellulitis present Compartment soft  LABS  Recent Labs  07/16/13 0430  HGB 9.4*  HCT 27.5*  WBC 11.1*  PLT 164     Recent Labs  07/16/13 0430  NA 142  K 3.9  BUN 12  CREATININE 0.76  GLUCOSE 117*     Assessment/Plan: 1 Day Post-Op Procedure(s) (LRB): UNICOMPARTMENTAL LEFT KNEE MEDIAL  (Left) Foley cath d/c'ed Advance diet Up with therapy D/C IV fluids Discharge home with home health Follow up in 2 weeks at [facility]. Follow up with OLIN,Enzley Kitchens D in 2 weeks.  Contact information:  [facility] [address], [place] [phone]    Expected ABLA  Treated with iron and will observe  Overweight (BMI 25-29.9) Estimated body mass index is 26.37 kg/(m^2) as calculated from the following:   Height as of this encounter: 5' (1.524 m).   Weight as of this encounter: 61.236 kg (135 lb). Patient also counseled that weight may inhibit the healing process Patient counseled that losing weight will help with future health issues       West Pugh. Trevaun Rendleman   PAC  07/16/2013, 8:07 AM

## 2013-07-16 NOTE — Progress Notes (Signed)
Physical Therapy Treatment Patient Details Name: EVELLYN TUFF MRN: [id number] DOB: Jul 14, 1955 Today's Date: 07/16/2013 Time: 0922-0948 PT Time Calculation (min): 26 min  PT Assessment / Plan / Recommendation  History of Present Illness L UKR   PT Comments   Pt doing well; dizzy but states due to meds;  Follow Up Recommendations  Home health PT     Does the patient have the potential to tolerate intense rehabilitation     Barriers to Discharge        Equipment Recommendations  Other (comment)    Recommendations for Other Services    Frequency 7X/week   Progress towards PT Goals Progress towards PT goals: Progressing toward goals  Plan Current plan remains appropriate    Precautions / Restrictions Precautions Precautions: Knee Restrictions Weight Bearing Restrictions: No   Pertinent Vitals/Pain  BP supine 96/64 Sitting 111/71  dizziness resolved after amb    Mobility  Bed Mobility Overal bed mobility: Needs Assistance Bed Mobility: Supine to Sit Supine to sit: Supervision;Modified independent (Device/Increase time) General bed mobility comments: verbal cues for technique, self assist  Transfers Overall transfer level: Needs assistance Equipment used: Rolling walker (2 wheeled) Transfers: Sit to/from Stand Sit to Stand: Supervision;Modified independent (Device/Increase time) General transfer comment: verbal cues or hand placment and wt shift Ambulation/Gait Ambulation/Gait assistance: Supervision;Modified independent (Device/Increase time) Ambulation Distance (Feet): 80 Feet Assistive device: Rolling walker (2 wheeled) Gait Pattern/deviations: Step-to pattern;Step-through pattern Gait velocity: decr General Gait Details: cues for sequence, RW position Stairs: Yes Stairs assistance: Min assist Stair Management: One rail Right;Step to pattern;Forwards;With walker Number of Stairs: 3 (and 1) General stair comments: cues for sequence, safety, placement     Exercises Total Joint Exercises Ankle Circles/Pumps: AROM;Both;15 reps Quad Sets: AROM;Both;15 reps Heel Slides: AROM;Left;10 reps Hip ABduction/ADduction: AROM;AAROM;Left;10 reps Straight Leg Raises: AROM;Left;10 reps;Strengthening Goniometric ROM: ~90   PT Diagnosis:    PT Problem List:   PT Treatment Interventions:     PT Goals (current goals can now be found in the care plan section) Acute Rehab PT Goals PT Goal Formulation: With patient Time For Goal Achievement: 07/22/13 Potential to Achieve Goals: Good  Visit Information  Last PT Received On: 07/16/13 Assistance Needed: +1 History of Present Illness: L UKR    Subjective Data      Cognition  Cognition Arousal/Alertness: Awake/alert Behavior During Therapy: WFL for tasks assessed/performed Overall Cognitive Status: Within Functional Limits for tasks assessed    Balance     End of Session PT - End of Session Equipment Utilized During Treatment: Gait belt Activity Tolerance: Patient tolerated treatment well Patient left: in chair;with call bell/phone within reach;with family/visitor present Nurse Communication: Mobility status   GP     [facility] 07/16/2013, 11:14 AM

## 2013-07-16 NOTE — Progress Notes (Signed)
[facility]  [facility] is providing the following services: RW, Commode, Tub Bench  If patient discharges after hours, please call [phone].   Linward Headland 07/16/2013, 11:17 AM

## 2013-07-16 NOTE — Progress Notes (Signed)
UR completed. Jonnie Finner RN CCM Case Mgmt phone [phone]

## 2013-07-16 NOTE — Discharge Summary (Signed)
Physician Discharge Summary  Patient ID: [name] MRN: [id number] DOB/AGE: 01-28-1956 58 y.o.  Admit date: 07/15/2013 Discharge date: 07/16/2013   Procedures:  Procedure(s) (LRB): UNICOMPARTMENTAL LEFT KNEE MEDIAL  (Left)  Attending Physician:  Dr. Paralee Cancel   Admission Diagnoses:   Left knee medial compartment OA / pain  Discharge Diagnoses:  Principal Problem:   S/P left UKR Active Problems:   Expected blood loss anemia   Overweight (BMI 25.0-29.9)  Past Medical History  Diagnosis Date  . Hiatal hernia   . Palpitations   . Anxiety   . Hypertension   . Hypercholesterolemia   . Depression   . Heart murmur   . Chest pain     "STRESS"  . Arthritis   . GERD (gastroesophageal reflux disease)   . History of stomach ulcers   . Difficulty sleeping   . Bipolar 1 disorder   . Back pain, chronic     HPI: [name], 58 y.o. female, evaluation of pain in both knees. She's had increasing pain in the medial aspects of both knees. She's gone through conservative treatment of a cortisone injection and Visco supplementation. She does have some chronic back issues for which she takes Percocet chronically. She is seen and evaluated in the office today. She is a relatively thin female with neutrally aligned lower extremities. Most of her tenderness is on the medial side of the knee per her reports as well as exam findings. Knee ligaments are stable. She has full knee extension and flexion, no flexion contractures. She has no palpable tenderness in the calves. Bilateral knee osteoarthritis, predominantly involving the medial compartment of the knee, correlating with exam symptoms. Left knee is currently worse than right. Radiographs done standing AP of both knees and standing lateral of both knees show medial compartment degenerative changes with complete loss of joint space medially. The lateral and patellofemoral compartments are relatively normal. After reviewing with her the  current situation, she, at this point, has failed conservative measures and is ready to proceed with surgical intervention. She is a candidate for partial knee arthroplasty based on her location of pain, exam findings, as well as radiographs. This has a secondary appeal due to her chronic pain issues in her back in terms of ease of recoverability and the normalcy of which bilateral knees will feel. After reviewing risks of infection, DVT, component failure, need for potential revision down the road, consent will be obtained based on our discussion today.   PCP: MUSE,ROCHELLE D., PA-C   Discharged Condition: good  Hospital Course:  Patient underwent the above stated procedure on 07/15/2013. Patient tolerated the procedure well and brought to the recovery room in good condition and subsequently to the floor.  POD #1 BP: 98/60 ; Pulse: 65 ; Temp: 98.5 F (36.9 C) ; Resp: 16  Patient reports pain as mild, pain controlled. No events throughout the night. Ready to be discharged home. Neurovascular intact, dorsiflexion/plantar flexion intact, incision: dressing C/D/I, no cellulitis present and compartment soft.   LABS  Basename    HGB  9.4  HCT  27.5    Discharge Exam: General appearance: alert, cooperative and no distress Extremities: Homans sign is negative, no sign of DVT, no edema, redness or tenderness in the calves or thighs and no ulcers, gangrene or trophic changes  Disposition:    Home or Self Care with follow up in 2 weeks   Follow-up Information   Follow up with [facility]. (Home Health  Physical Therapy)    Contact information:   [address] [place] [address] [phone]       Follow up with [name], MD. Schedule an appointment as soon as possible for a visit in 2 weeks.   Specialty:  Orthopedic Surgery   Contact information:   [address] [place] [address] [phone]       Discharge Orders   Future Orders  Complete By Expires   Call MD / Call 911  As directed    Comments:     If you experience chest pain or shortness of breath, CALL 911 and be transported to the hospital emergency room.  If you develope a fever above 101 F, pus (white drainage) or increased drainage or redness at the wound, or calf pain, call your surgeon's office.   Change dressing  As directed    Comments:     Maintain surgical dressing for 10-14 days, or until follow up in the clinic.   Constipation Prevention  As directed    Comments:     Drink plenty of fluids.  Prune juice may be helpful.  You may use a stool softener, such as Colace (over the counter) 100 mg twice a day.  Use MiraLax (over the counter) for constipation as needed.   Diet - low sodium heart healthy  As directed    Discharge instructions  As directed    Comments:     Maintain surgical dressing for 10-14 days, or until follow up in the clinic. Follow up in 2 weeks at [facility]. Call with any questions or concerns.   Increase activity slowly as tolerated  As directed    TED hose  As directed    Comments:     Use stockings (TED hose) for 2 weeks on both leg(s).  You may remove them at night for sleeping.   Weight bearing as tolerated  As directed    Questions:     Laterality:     Extremity:          Medication List    STOP taking these medications       acetaminophen-codeine 300-30 MG per tablet  Commonly known as:  TYLENOL #3     traMADol 50 MG tablet  Commonly known as:  ULTRAM      TAKE these medications       ALPRAZolam 1 MG tablet  Commonly known as:  XANAX  Take 2 mg by mouth 3 (three) times daily as needed for anxiety.     amphetamine-dextroamphetamine 20 MG tablet  Commonly known as:  ADDERALL  Take 40 mg by mouth every morning.     aspirin 325 MG EC tablet  Take 1 tablet (325 mg total) by mouth 2 (two) times daily.     DSS 100 MG Caps  Take 100 mg by mouth 2 (two) times daily.     ferrous sulfate 325 (65 FE)  MG tablet  Take 1 tablet (325 mg total) by mouth 3 (three) times daily after meals.     HYDROcodone-acetaminophen 7.5-325 MG per tablet  Commonly known as:  NORCO  Take 1-2 tablets by mouth every 4 (four) hours as needed for moderate pain.     hyoscyamine 0.125 MG tablet  Commonly known as:  LEVSIN, ANASPAZ  Take 0.125 mg by mouth every 4 (four) hours as needed for bladder spasms.     lisinopril 20 MG tablet  Commonly known as:  PRINIVIL,ZESTRIL  Take 20  mg by mouth every morning.     methocarbamol 500 MG tablet  Commonly known as:  ROBAXIN  Take 1 tablet (500 mg total) by mouth every 6 (six) hours as needed for muscle spasms.     mupirocin nasal ointment 2 %  Commonly known as:  BACTROBAN  Place 1 application into the nose 2 (two) times daily. Use one-half of tube in each nostril twice daily for five (5) days. After application, press sides of nose together and gently massage.     omeprazole 20 MG capsule  Commonly known as:  PRILOSEC  Take 20 mg by mouth daily.     polyethylene glycol packet  Commonly known as:  MIRALAX / GLYCOLAX  Take 17 g by mouth 2 (two) times daily.     simvastatin 20 MG tablet  Commonly known as:  ZOCOR  Take 20 mg by mouth every evening.         Signed: West Pugh. Chrisangel Eskenazi   PAC  07/16/2013, 4:33 PM

## 2013-07-16 NOTE — Evaluation (Signed)
Occupational Therapy Evaluation Patient Details Name: MANASA SPEASE MRN: [id number] DOB: 11-14-1955 Today's Date: 07/16/2013 Time: 9767-3419 OT Time Calculation (min): 26 min  OT Assessment / Plan / Recommendation History of present illness L UKR   Clinical Impression   Pt is doing very well. All education completed for OT.     OT Assessment  Patient does not need any further OT services    Follow Up Recommendations  No OT follow up;Supervision - Intermittent    Barriers to Discharge      Equipment Recommendations  3 in 1 bedside comode;Tub/shower bench (has all DME delivered)    Recommendations for Other Services    Frequency       Precautions / Restrictions Precautions Precautions: Knee Restrictions Weight Bearing Restrictions: No    Pertinent Vitals/Pain No complaints of   ADL  Eating/Feeding: Independent Where Assessed - Eating/Feeding: Chair Grooming: Performed;Wash/dry hands;Min guard Where Assessed - Grooming: Unsupported standing Upper Body Bathing: Simulated;Chest;Right arm;Left arm;Abdomen;Set up Where Assessed - Upper Body Bathing: Unsupported sitting Lower Body Bathing: Simulated;Min guard Where Assessed - Lower Body Bathing: Supported sit to stand Upper Body Dressing: Simulated;Set up Where Assessed - Upper Body Dressing: Unsupported sitting Lower Body Dressing: Simulated;Min guard Where Assessed - Lower Body Dressing: Supported sit to stand Toilet Transfer: Performed;Min Conservator, museum/gallery: Raised toilet seat with arms (or 3-in-1 over toilet) Toileting - Clothing Manipulation and Hygiene: Simulated;Min guard Where Assessed - Best boy and Hygiene: Sit to stand from 3-in-1 or toilet Equipment Used: Rolling walker ADL Comments: Verbally reviewed and demonstrated technique for tub bench transfer. Pt able to reach down to L foot for LB dressing but did review sequence for dressing. Pt needed min cues for overall safety  with hand placement on 3in1/chair. She has PRN help from friends and family.     OT Diagnosis:    OT Problem List:   OT Treatment Interventions:     OT Goals(Current goals can be found in the care plan section) Acute Rehab OT Goals Patient Stated Goal: be independent  Visit Information  Last OT Received On: 07/16/13 Assistance Needed: +1 History of Present Illness: L UKR       Prior Functioning               Vision/Perception     Cognition  Cognition Arousal/Alertness: Awake/alert Behavior During Therapy: WFL for tasks assessed/performed Overall Cognitive Status: Within Functional Limits for tasks assessed    Extremity/Trunk Assessment Upper Extremity Assessment Upper Extremity Assessment: Overall WFL for tasks assessed     Mobility  Transfers Overall transfer level: Needs assistance Equipment used: Rolling walker (2 wheeled) Transfers: Sit to/from Stand Sit to Stand: Supervision General transfer comment: verbal cues for hand placement.        Balance     End of Session OT - End of Session Equipment Utilized During Treatment: Rolling walker Activity Tolerance: Patient tolerated treatment well Patient left: in chair;with call bell/phone within reach  GO     Jules Schick [phone] 07/16/2013, 11:19 AM

## 2013-08-05 ENCOUNTER — Ambulatory Visit: Payer: Medicaid Other | Attending: Sports Medicine | Admitting: Physical Therapy

## 2013-08-05 DIAGNOSIS — R5381 Other malaise: Secondary | ICD-10-CM | POA: Insufficient documentation

## 2013-08-05 DIAGNOSIS — M25669 Stiffness of unspecified knee, not elsewhere classified: Secondary | ICD-10-CM | POA: Insufficient documentation

## 2013-08-05 DIAGNOSIS — R269 Unspecified abnormalities of gait and mobility: Secondary | ICD-10-CM | POA: Insufficient documentation

## 2013-08-05 DIAGNOSIS — M25569 Pain in unspecified knee: Secondary | ICD-10-CM | POA: Diagnosis not present

## 2013-08-05 DIAGNOSIS — IMO0001 Reserved for inherently not codable concepts without codable children: Secondary | ICD-10-CM | POA: Insufficient documentation

## 2013-08-07 ENCOUNTER — Ambulatory Visit: Payer: Medicaid Other | Attending: Sports Medicine | Admitting: Physical Therapy

## 2013-08-07 DIAGNOSIS — M25669 Stiffness of unspecified knee, not elsewhere classified: Secondary | ICD-10-CM | POA: Insufficient documentation

## 2013-08-07 DIAGNOSIS — R5381 Other malaise: Secondary | ICD-10-CM | POA: Insufficient documentation

## 2013-08-07 DIAGNOSIS — M25569 Pain in unspecified knee: Secondary | ICD-10-CM | POA: Insufficient documentation

## 2013-08-07 DIAGNOSIS — IMO0001 Reserved for inherently not codable concepts without codable children: Secondary | ICD-10-CM | POA: Insufficient documentation

## 2013-08-07 DIAGNOSIS — R269 Unspecified abnormalities of gait and mobility: Secondary | ICD-10-CM | POA: Insufficient documentation

## 2013-08-14 ENCOUNTER — Ambulatory Visit: Payer: Medicaid Other | Admitting: Physical Therapy

## 2013-08-20 ENCOUNTER — Encounter: Payer: Medicaid Other | Admitting: Physical Therapy

## 2013-08-21 ENCOUNTER — Ambulatory Visit: Payer: Medicaid Other | Admitting: Physical Therapy

## 2013-09-05 ENCOUNTER — Encounter: Payer: Self-pay | Admitting: Gastroenterology

## 2013-09-05 ENCOUNTER — Ambulatory Visit (INDEPENDENT_AMBULATORY_CARE_PROVIDER_SITE_OTHER): Payer: Medicaid Other | Admitting: Gastroenterology

## 2013-09-05 ENCOUNTER — Encounter (INDEPENDENT_AMBULATORY_CARE_PROVIDER_SITE_OTHER): Payer: Self-pay

## 2013-09-05 VITALS — BP 140/88 | HR 87 | Temp 97.6°F | Ht 63.0 in | Wt 134.6 lb

## 2013-09-05 DIAGNOSIS — K5900 Constipation, unspecified: Secondary | ICD-10-CM | POA: Insufficient documentation

## 2013-09-05 DIAGNOSIS — K279 Peptic ulcer, site unspecified, unspecified as acute or chronic, without hemorrhage or perforation: Secondary | ICD-10-CM | POA: Insufficient documentation

## 2013-09-05 MED ORDER — OMEPRAZOLE 20 MG PO CPDR: 20.0000 mg | DELAYED_RELEASE_CAPSULE | Freq: Every day | ORAL | Status: DC

## 2013-09-05 MED ORDER — LINACLOTIDE 290 MCG PO CAPS: 290.0000 ug | ORAL_CAPSULE | Freq: Every day | ORAL | Status: DC

## 2013-09-05 NOTE — Progress Notes (Signed)
Referring Provider: Raiford Simmonds., PA-C Primary Care Physician:  Raiford Simmonds., PA-C Primary GI: Dr. Gala Romney  Chief Complaint  Patient presents with  . Medication Refill    HPI:   [name] presents today with history of PUD in the setting of Goody powders chronically; last EGD in Oct 2014 with findings of Schatzki's ring s/p 54 dilation, 7 mm elliptical prepyloric antral ulcer, negative path. No surveillance EGD completed. Prilosec as needed. Denies reflux exacerbation. Some lower abdominal, periumbilical discomfort at times, relieved with defecation. Deals with constipation. BM usually once every 2 weeks. Miralax prn. No hematochezia or melena. Sometimes will eat chicken without difficulty, sometimes has pain in epigastric region with chicken. Depends on dietary choices.    Past Medical History  Diagnosis Date  . Hiatal hernia   . Palpitations   . Anxiety   . Hypertension   . Hypercholesterolemia   . Depression   . Heart murmur   . Chest pain     "STRESS"  . Arthritis   . GERD (gastroesophageal reflux disease)   . History of stomach ulcers   . Difficulty sleeping   . Bipolar 1 disorder   . Back pain, chronic     Past Surgical History  Procedure Laterality Date  . Appendectomy    . Carpal tunnel release      BILATERAL  . Colonoscopy, esophagogastroduodenoscopy (egd) and esophageal dilation N/A 02/06/2013    RMR. Benign colon polyp, diverticulosis, hemorrhoids. EGD with Schatzki's ring s/p 30 F dilation, 60mm elliptical prepyloric antral ulcer with adjacent scarring, negative path  . Partial knee arthroplasty Left 07/15/2013    Procedure: UNICOMPARTMENTAL LEFT KNEE MEDIAL ;  Surgeon: [name], MD;  Location: [facility];  Service: Orthopedics;  Laterality: Left;    Current Outpatient Prescriptions  Medication Sig Dispense Refill  . ALPRAZolam (XANAX) 1 MG tablet Take 2 mg by mouth 3 (three) times daily as needed for anxiety.       Marland Kitchen  amphetamine-dextroamphetamine (ADDERALL) 20 MG tablet Take 40 mg by mouth every morning.       . docusate sodium 100 MG CAPS Take 100 mg by mouth 2 (two) times daily.  10 capsule  0  . ferrous sulfate 325 (65 FE) MG tablet Take 1 tablet (325 mg total) by mouth 3 (three) times daily after meals.    3  . hyoscyamine (LEVSIN, ANASPAZ) 0.125 MG tablet Take 0.125 mg by mouth every 4 (four) hours as needed for bladder spasms.      Marland Kitchen lisinopril (PRINIVIL,ZESTRIL) 20 MG tablet Take 20 mg by mouth every morning.       . methocarbamol (ROBAXIN) 500 MG tablet Take 1 tablet (500 mg total) by mouth every 6 (six) hours as needed for muscle spasms.  50 tablet  0  . mupirocin nasal ointment (BACTROBAN) 2 % Place 1 application into the nose 2 (two) times daily. Use one-half of tube in each nostril twice daily for five (5) days. After application, press sides of nose together and gently massage.      Marland Kitchen omeprazole (PRILOSEC) 20 MG capsule Take 20 mg by mouth daily.      . polyethylene glycol (MIRALAX / GLYCOLAX) packet Take 17 g by mouth 2 (two) times daily.  14 each  0  . simvastatin (ZOCOR) 20 MG tablet Take 20 mg by mouth every evening.      Marland Kitchen HYDROcodone-acetaminophen (NORCO) 7.5-325 MG per tablet Take 1-2 tablets by mouth every 4 (four)  hours as needed for moderate pain.  100 tablet  0   No current facility-administered medications for this visit.    Allergies as of 09/05/2013 - Review Complete 09/05/2013  Allergen Reaction Noted  . Morphine and related Nausea Only 01/24/2013    Family History  Problem Relation Age of Onset  . Heart attack Mother   . Colon cancer Neg Hx     History   Social History  . Marital Status: Single    Spouse Name: N/A    Number of Children: 2  . Years of Education: N/A   Social History Main Topics  . Smoking status: Never Smoker   . Smokeless tobacco: None  . Alcohol Use: No  . Drug Use: No  . Sexual Activity: No   Other Topics Concern  . None   Social History  Narrative  . None    Review of Systems: As mentioned in HPI.   Physical Exam: BP 140/88  Pulse 87  Temp(Src) 97.6 F (36.4 C) (Oral)  Ht 5\' 3"  (1.6 m)  Wt 134 lb 9.6 oz (61.054 kg)  BMI 23.85 kg/m2 General:   Alert and oriented. No distress noted. Pleasant and cooperative.  Head:  Normocephalic and atraumatic. Eyes:  Conjuctiva clear without scleral icterus. Mouth:  Oral mucosa pink and moist. Good dentition. No lesions. Heart:  S1, S2 present without murmurs, rubs, or gallops. Regular rate and rhythm. Abdomen:  +BS, soft, non-tender and non-distended. No rebound or guarding. In chair, unable to get on table due to recent knee replacement.  Extremities:  Without edema. Neurologic:  Alert and  oriented x4;  grossly normal neurologically. Skin:  Intact without significant lesions or rashes. Psych:  Alert and cooperative. Normal mood and affect.

## 2013-09-05 NOTE — Patient Instructions (Signed)
Take Prilosec once each morning, 30 minutes before breakfast.   For constipation: start taking Linzess 1 capsule each morning on an empty stomach, at least 30 minutes before breakfast.   We will see you in 3 months.

## 2013-09-05 NOTE — Assessment & Plan Note (Signed)
In setting of aspirin powders last year, documented Oct 2014 with negative path. No surveillance EGD completed. Likely will need this to document healing. No concerning upper GI symptoms at this point; recommend taking Prilosec once daily instead of prn. Refills provided. Will discuss with Dr. Gala Romney surveillance EGD.

## 2013-09-05 NOTE — Assessment & Plan Note (Signed)
Chronic. Colonoscopy up-to-date. Start Linzess 290 mcg daily. Return in 3 months. Voucher and rx provided.

## 2013-09-09 ENCOUNTER — Other Ambulatory Visit: Payer: Self-pay | Admitting: Internal Medicine

## 2013-09-09 DIAGNOSIS — K279 Peptic ulcer, site unspecified, unspecified as acute or chronic, without hemorrhage or perforation: Secondary | ICD-10-CM

## 2013-09-09 NOTE — Progress Notes (Signed)
Due to history of ulcer, needs surveillance EGD. Overdue for surveillance.   I discussed this possibility with patient at time of visit.  Please arrange for EGD with Dr. Gala Romney for surveillance due to hx of PUD.

## 2013-09-09 NOTE — Progress Notes (Signed)
EGD is scheduled w/RMR on 09/23/13 and [name] is aware and I have mailed her instructions

## 2013-09-10 ENCOUNTER — Encounter: Payer: Medicaid Other | Admitting: *Deleted

## 2013-09-10 NOTE — Progress Notes (Signed)
cc'd to pcp 

## 2013-09-12 ENCOUNTER — Encounter (HOSPITAL_COMMUNITY): Payer: Self-pay | Admitting: Pharmacy Technician

## 2013-09-23 ENCOUNTER — Encounter (HOSPITAL_COMMUNITY)
Admission: RE | Disposition: A | Discharge status: Home / Self Care | Payer: Self-pay | Source: Ambulatory Visit | Attending: Internal Medicine

## 2013-09-23 ENCOUNTER — Encounter (HOSPITAL_COMMUNITY): Payer: Self-pay | Admitting: *Deleted

## 2013-09-23 ENCOUNTER — Ambulatory Visit (HOSPITAL_COMMUNITY)
Admission: RE | Admit: 2013-09-23 | Discharge: 2013-09-23 | Disposition: A | Discharge status: Home / Self Care | Payer: Medicaid Other | Source: Ambulatory Visit | Attending: Internal Medicine | Admitting: Internal Medicine

## 2013-09-23 DIAGNOSIS — K279 Peptic ulcer, site unspecified, unspecified as acute or chronic, without hemorrhage or perforation: Secondary | ICD-10-CM

## 2013-09-23 DIAGNOSIS — K219 Gastro-esophageal reflux disease without esophagitis: Secondary | ICD-10-CM | POA: Insufficient documentation

## 2013-09-23 DIAGNOSIS — F329 Major depressive disorder, single episode, unspecified: Secondary | ICD-10-CM | POA: Insufficient documentation

## 2013-09-23 DIAGNOSIS — K222 Esophageal obstruction: Secondary | ICD-10-CM | POA: Insufficient documentation

## 2013-09-23 DIAGNOSIS — I10 Essential (primary) hypertension: Secondary | ICD-10-CM | POA: Insufficient documentation

## 2013-09-23 DIAGNOSIS — K319 Disease of stomach and duodenum, unspecified: Secondary | ICD-10-CM

## 2013-09-23 DIAGNOSIS — F3289 Other specified depressive episodes: Secondary | ICD-10-CM | POA: Insufficient documentation

## 2013-09-23 DIAGNOSIS — E7800 Pure hypercholesterolemia, unspecified: Secondary | ICD-10-CM | POA: Insufficient documentation

## 2013-09-23 DIAGNOSIS — F411 Generalized anxiety disorder: Secondary | ICD-10-CM | POA: Insufficient documentation

## 2013-09-23 DIAGNOSIS — K2970 Gastritis, unspecified, without bleeding: Secondary | ICD-10-CM | POA: Insufficient documentation

## 2013-09-23 DIAGNOSIS — K3189 Other diseases of stomach and duodenum: Secondary | ICD-10-CM

## 2013-09-23 DIAGNOSIS — K2990 Gastroduodenitis, unspecified, without bleeding: Principal | ICD-10-CM

## 2013-09-23 DIAGNOSIS — K5900 Constipation, unspecified: Secondary | ICD-10-CM | POA: Insufficient documentation

## 2013-09-23 HISTORY — PX: ESOPHAGOGASTRODUODENOSCOPY: SHX5428

## 2013-09-23 SURGERY — EGD (ESOPHAGOGASTRODUODENOSCOPY)
Anesthesia: Moderate Sedation

## 2013-09-23 MED ORDER — SIMETHICONE 40 MG/0.6ML PO SUSP
ORAL | Status: DC | PRN
  Administered 2013-09-23: 10:00:00

## 2013-09-23 MED ORDER — MEPERIDINE HCL 100 MG/ML IJ SOLN
INTRAMUSCULAR | Status: AC
  Filled 2013-09-23: qty 2

## 2013-09-23 MED ORDER — MIDAZOLAM HCL 5 MG/5ML IJ SOLN
INTRAMUSCULAR | Status: DC | PRN
  Administered 2013-09-23 (×2): 2 mg via INTRAVENOUS

## 2013-09-23 MED ORDER — ONDANSETRON HCL 4 MG/2ML IJ SOLN
INTRAMUSCULAR | Status: DC | PRN
  Administered 2013-09-23: 4 mg via INTRAVENOUS

## 2013-09-23 MED ORDER — SODIUM CHLORIDE 0.9 % IV SOLN
INTRAVENOUS | Status: DC
  Administered 2013-09-23: 09:00:00 via INTRAVENOUS

## 2013-09-23 MED ORDER — ONDANSETRON HCL 4 MG/2ML IJ SOLN
INTRAMUSCULAR | Status: AC
  Filled 2013-09-23: qty 2

## 2013-09-23 MED ORDER — LIDOCAINE VISCOUS 2 % MT SOLN
OROMUCOSAL | Status: AC
  Filled 2013-09-23: qty 15

## 2013-09-23 MED ORDER — LIDOCAINE VISCOUS 2 % MT SOLN
OROMUCOSAL | Status: DC | PRN
  Administered 2013-09-23: 4 mL via OROMUCOSAL

## 2013-09-23 MED ORDER — MEPERIDINE HCL 100 MG/ML IJ SOLN
INTRAMUSCULAR | Status: DC | PRN
  Administered 2013-09-23 (×2): 50 mg via INTRAVENOUS

## 2013-09-23 MED ORDER — MIDAZOLAM HCL 5 MG/5ML IJ SOLN
INTRAMUSCULAR | Status: AC
  Filled 2013-09-23: qty 10

## 2013-09-23 NOTE — Op Note (Signed)
[facility] [address] [place], [address]   ENDOSCOPY PROCEDURE REPORT  PATIENT: Nancy, Yang  MR#: [id number] BIRTHDATE: 05-Nov-1955 , 69  yrs. old GENDER: Female ENDOSCOPIST: R.  Garfield Cornea, MD FACP [facility] REFERRED BY:  Royce Macadamia, PA PROCEDURE DATE:  09/23/2013 PROCEDURE:     EGD with gastric biopsy  INDICATIONS:     History of gastric ulcer; assess for ulcer healing  INFORMED CONSENT:   The risks, benefits, limitations, alternatives and imponderables have been discussed.  The potential for biopsy, esophogeal dilation, etc. have also been reviewed.  Questions have been answered.  All parties agreeable.  Please see the history and physical in the medical record for more information.  MEDICATIONS:      Versed 4 mg IV and Demerol 100 mg IV in divided doses. Zofran 4 mg IV. Xylocaine 2 orally  DESCRIPTION OF PROCEDURE:   The EG-[id number] ([id number])  endoscope was introduced through the mouth and advanced to the second portion of the duodenum without difficulty or limitations.  The mucosal surfaces were surveyed very carefully during advancement of the scope and upon withdrawal.  Retroflexion view of the proximal stomach and esophagogastric junction was performed.      FINDINGS:Noncritical Schatzki's ring; otherwise, normal esophagus. Stomach empty. Some deformity of the antrum prepyloric mucosa. Because a little irregular in your scar formation. I did not identify an ulcer. No obvious infiltrating process. Patent pylorus. Normal first and second portion of the duodenum  THERAPEUTIC / DIAGNOSTIC MANEUVERS PERFORMED:  Biopsy of the abnormal antrum taken for histologic study   COMPLICATIONS:  None  IMPRESSION:  Noncritical Schatzki's ring -  not manipulated. Deformity/scarring of the antral/prepyloric mucosa. Previously noted ulcer disease no longer present. Mucosa not entirely normal appearing. Status post gastric biopsy  RECOMMENDATIONS:   Continue to avoid all forms of nonsteroidals including aspirin. Continue omeprazole 20 mg daily. Further recommendations to follow pending review of pathology report    _______________________________ R. Garfield Cornea, MD FACP [facility] eSigned:  R. Garfield Cornea, MD FACP [facility] 09/23/2013 10:16 AM     CC:  PATIENT NAME:  Nancy, Yang MR#: [id number]

## 2013-09-23 NOTE — Interval H&P Note (Signed)
History and Physical Interval Note:  09/23/2013 9:37 AM  [name]  has presented today for surgery, with the diagnosis of SURVEILLANCE DUE TO PUD  The various methods of treatment have been discussed with the patient and family. After consideration of risks, benefits and other options for treatment, the patient has consented to  Procedure(s) with comments: ESOPHAGOGASTRODUODENOSCOPY (EGD) (N/A) - 9:45 as a surgical intervention .  The patient's history has been reviewed, patient examined, no change in status, stable for surgery.  I have reviewed the patient's chart and labs.  Questions were answered to the patient's satisfaction.     Taking Prilosec daily. Linzess has helped constipation significantly; otherwise no change. EGD today per plan.  The risks, benefits, limitations, alternatives and imponderables have been reviewed with the patient. Potential for esophageal dilation, biopsy, etc. have also been reviewed.  Questions have been answered. All parties agreeable.  Cristopher Estimable Nijee Heatwole

## 2013-09-23 NOTE — Discharge Instructions (Addendum)
EGD Discharge instructions Please read the instructions outlined below and refer to this sheet in the next few weeks. These discharge instructions provide you with general information on caring for yourself after you leave the hospital. Your doctor may also give you specific instructions. While your treatment has been planned according to the most current medical practices available, unavoidable complications occasionally occur. If you have any problems or questions after discharge, please call your doctor. ACTIVITY  You may resume your regular activity but move at a slower pace for the next 24 hours.   Take frequent rest periods for the next 24 hours.   Walking will help expel (get rid of) the air and reduce the bloated feeling in your abdomen.   No driving for 24 hours (because of the anesthesia (medicine) used during the test).   You may shower.   Do not sign any important legal documents or operate any machinery for 24 hours (because of the anesthesia used during the test).  NUTRITION  Drink plenty of fluids.   You may resume your normal diet.   Begin with a light meal and progress to your normal diet.   Avoid alcoholic beverages for 24 hours or as instructed by your caregiver.  MEDICATIONS  You may resume your normal medications unless your caregiver tells you otherwise.  WHAT YOU CAN EXPECT TODAY  You may experience abdominal discomfort such as a feeling of fullness or gas pains.  FOLLOW-UP  Your doctor will discuss the results of your test with you.  SEEK IMMEDIATE MEDICAL ATTENTION IF ANY OF THE FOLLOWING OCCUR:  Excessive nausea (feeling sick to your stomach) and/or vomiting.   Severe abdominal pain and distention (swelling).   Trouble swallowing.   Temperature over 101 F (37.8 C).   Rectal bleeding or vomiting of blood.    Continue omeprazole daily  Continue to avoid aspirin /NSAIDs  Further recommendations to follow pending review of pathology  report

## 2013-09-23 NOTE — H&P (View-Only) (Signed)
Referring Provider: Raiford Simmonds., PA-C Primary Care Physician:  Raiford Simmonds., PA-C Primary GI: Dr. Gala Romney  Chief Complaint  Patient presents with  . Medication Refill    HPI:   [name] presents today with history of PUD in the setting of Goody powders chronically; last EGD in Oct 2014 with findings of Schatzki's ring s/p 54 dilation, 7 mm elliptical prepyloric antral ulcer, negative path. No surveillance EGD completed. Prilosec as needed. Denies reflux exacerbation. Some lower abdominal, periumbilical discomfort at times, relieved with defecation. Deals with constipation. BM usually once every 2 weeks. Miralax prn. No hematochezia or melena. Sometimes will eat chicken without difficulty, sometimes has pain in epigastric region with chicken. Depends on dietary choices.    Past Medical History  Diagnosis Date  . Hiatal hernia   . Palpitations   . Anxiety   . Hypertension   . Hypercholesterolemia   . Depression   . Heart murmur   . Chest pain     "STRESS"  . Arthritis   . GERD (gastroesophageal reflux disease)   . History of stomach ulcers   . Difficulty sleeping   . Bipolar 1 disorder   . Back pain, chronic     Past Surgical History  Procedure Laterality Date  . Appendectomy    . Carpal tunnel release      BILATERAL  . Colonoscopy, esophagogastroduodenoscopy (egd) and esophageal dilation N/A 02/06/2013    RMR. Benign colon polyp, diverticulosis, hemorrhoids. EGD with Schatzki's ring s/p 62 F dilation, 1mm elliptical prepyloric antral ulcer with adjacent scarring, negative path  . Partial knee arthroplasty Left 07/15/2013    Procedure: UNICOMPARTMENTAL LEFT KNEE MEDIAL ;  Surgeon: [name], MD;  Location: [facility];  Service: Orthopedics;  Laterality: Left;    Current Outpatient Prescriptions  Medication Sig Dispense Refill  . ALPRAZolam (XANAX) 1 MG tablet Take 2 mg by mouth 3 (three) times daily as needed for anxiety.       Marland Kitchen  amphetamine-dextroamphetamine (ADDERALL) 20 MG tablet Take 40 mg by mouth every morning.       . docusate sodium 100 MG CAPS Take 100 mg by mouth 2 (two) times daily.  10 capsule  0  . ferrous sulfate 325 (65 FE) MG tablet Take 1 tablet (325 mg total) by mouth 3 (three) times daily after meals.    3  . hyoscyamine (LEVSIN, ANASPAZ) 0.125 MG tablet Take 0.125 mg by mouth every 4 (four) hours as needed for bladder spasms.      Marland Kitchen lisinopril (PRINIVIL,ZESTRIL) 20 MG tablet Take 20 mg by mouth every morning.       . methocarbamol (ROBAXIN) 500 MG tablet Take 1 tablet (500 mg total) by mouth every 6 (six) hours as needed for muscle spasms.  50 tablet  0  . mupirocin nasal ointment (BACTROBAN) 2 % Place 1 application into the nose 2 (two) times daily. Use one-half of tube in each nostril twice daily for five (5) days. After application, press sides of nose together and gently massage.      Marland Kitchen omeprazole (PRILOSEC) 20 MG capsule Take 20 mg by mouth daily.      . polyethylene glycol (MIRALAX / GLYCOLAX) packet Take 17 g by mouth 2 (two) times daily.  14 each  0  . simvastatin (ZOCOR) 20 MG tablet Take 20 mg by mouth every evening.      Marland Kitchen HYDROcodone-acetaminophen (NORCO) 7.5-325 MG per tablet Take 1-2 tablets by mouth every 4 (four)  hours as needed for moderate pain.  100 tablet  0   No current facility-administered medications for this visit.    Allergies as of 09/05/2013 - Review Complete 09/05/2013  Allergen Reaction Noted  . Morphine and related Nausea Only 01/24/2013    Family History  Problem Relation Age of Onset  . Heart attack Mother   . Colon cancer Neg Hx     History   Social History  . Marital Status: Single    Spouse Name: N/A    Number of Children: 2  . Years of Education: N/A   Social History Main Topics  . Smoking status: Never Smoker   . Smokeless tobacco: None  . Alcohol Use: No  . Drug Use: No  . Sexual Activity: No   Other Topics Concern  . None   Social History  Narrative  . None    Review of Systems: As mentioned in HPI.   Physical Exam: BP 140/88  Pulse 87  Temp(Src) 97.6 F (36.4 C) (Oral)  Ht 5\' 3"  (1.6 m)  Wt 134 lb 9.6 oz (61.054 kg)  BMI 23.85 kg/m2 General:   Alert and oriented. No distress noted. Pleasant and cooperative.  Head:  Normocephalic and atraumatic. Eyes:  Conjuctiva clear without scleral icterus. Mouth:  Oral mucosa pink and moist. Good dentition. No lesions. Heart:  S1, S2 present without murmurs, rubs, or gallops. Regular rate and rhythm. Abdomen:  +BS, soft, non-tender and non-distended. No rebound or guarding. In chair, unable to get on table due to recent knee replacement.  Extremities:  Without edema. Neurologic:  Alert and  oriented x4;  grossly normal neurologically. Skin:  Intact without significant lesions or rashes. Psych:  Alert and cooperative. Normal mood and affect.

## 2013-09-24 ENCOUNTER — Encounter: Payer: Self-pay | Admitting: Internal Medicine

## 2013-09-24 ENCOUNTER — Ambulatory Visit: Payer: Medicaid Other | Attending: Sports Medicine | Admitting: Physical Therapy

## 2013-09-24 ENCOUNTER — Encounter (HOSPITAL_COMMUNITY): Payer: Self-pay | Admitting: Internal Medicine

## 2013-09-24 DIAGNOSIS — R269 Unspecified abnormalities of gait and mobility: Secondary | ICD-10-CM | POA: Insufficient documentation

## 2013-09-24 DIAGNOSIS — R5381 Other malaise: Secondary | ICD-10-CM | POA: Insufficient documentation

## 2013-09-24 DIAGNOSIS — M25669 Stiffness of unspecified knee, not elsewhere classified: Secondary | ICD-10-CM | POA: Insufficient documentation

## 2013-09-24 DIAGNOSIS — IMO0001 Reserved for inherently not codable concepts without codable children: Secondary | ICD-10-CM | POA: Insufficient documentation

## 2013-09-24 DIAGNOSIS — M25569 Pain in unspecified knee: Secondary | ICD-10-CM | POA: Insufficient documentation

## 2013-10-01 ENCOUNTER — Ambulatory Visit: Payer: Medicaid Other | Admitting: Physical Therapy

## 2013-10-03 ENCOUNTER — Ambulatory Visit: Payer: Medicaid Other | Admitting: *Deleted

## 2013-12-05 ENCOUNTER — Encounter: Payer: Self-pay | Admitting: Gastroenterology

## 2013-12-05 ENCOUNTER — Ambulatory Visit (INDEPENDENT_AMBULATORY_CARE_PROVIDER_SITE_OTHER): Payer: Medicaid Other | Admitting: Gastroenterology

## 2013-12-05 ENCOUNTER — Encounter (INDEPENDENT_AMBULATORY_CARE_PROVIDER_SITE_OTHER): Payer: Self-pay

## 2013-12-05 VITALS — BP 112/80 | HR 110 | Temp 99.3°F | Ht 63.0 in | Wt 129.6 lb

## 2013-12-05 DIAGNOSIS — R110 Nausea: Secondary | ICD-10-CM

## 2013-12-05 DIAGNOSIS — K279 Peptic ulcer, site unspecified, unspecified as acute or chronic, without hemorrhage or perforation: Secondary | ICD-10-CM

## 2013-12-05 MED ORDER — ONDANSETRON HCL 4 MG PO TABS: 4.0000 mg | ORAL_TABLET | Freq: Three times a day (TID) | ORAL | Status: DC | PRN

## 2013-12-05 MED ORDER — OMEPRAZOLE 20 MG PO CPDR: 20.0000 mg | DELAYED_RELEASE_CAPSULE | Freq: Every day | ORAL | Status: DC

## 2013-12-05 NOTE — Progress Notes (Signed)
Referring Provider: Raiford Simmonds., PA-C Primary Care Physician:  Raiford Simmonds., PA-C Primary GI: Dr. Gala Romney   Chief Complaint  Patient presents with  . Follow-up    still has alot of nausea    HPI:   [name] presents today in follow-up with history of PUD secondary to aspirin powder use. Recent EGD in May 2015 with non-critical Schatzki's ring, not manipulated, healed ulcer, gastric bx negative. Chronic constipation, started on Linzess 290 mcg daily earlier this spring.   Weight loss noted of approximately 5 lbs since last visit . Eats a few bites then full. Nausea in the mornings. Sight of food makes her nauseated.   Constipation: doing well with Linzess 290 mcg daily.    Past Medical History  Diagnosis Date  . Hiatal hernia   . Palpitations   . Anxiety   . Hypertension   . Hypercholesterolemia   . Depression   . Heart murmur   . Chest pain     "STRESS"  . Arthritis   . GERD (gastroesophageal reflux disease)   . History of stomach ulcers   . Difficulty sleeping   . Bipolar 1 disorder   . Back pain, chronic     Past Surgical History  Procedure Laterality Date  . Appendectomy    . Carpal tunnel release      BILATERAL  . Colonoscopy, esophagogastroduodenoscopy (egd) and esophageal dilation N/A 02/06/2013    RMR. Benign colon polyp, diverticulosis, hemorrhoids. EGD with Schatzki's ring s/p 71 F dilation, 31mm elliptical prepyloric antral ulcer with adjacent scarring, negative path  . Partial knee arthroplasty Left 07/15/2013    Procedure: UNICOMPARTMENTAL LEFT KNEE MEDIAL ;  Surgeon: [name], MD;  Location: [facility];  Service: Orthopedics;  Laterality: Left;  . Esophagogastroduodenoscopy N/A 09/23/2013    Dr. Gala Romney: non-critical Schatzki's ring, not manipulated, healed ulcer, gastric bx negative    Current Outpatient Prescriptions  Medication Sig Dispense Refill  . ALPRAZolam (XANAX) 1 MG tablet Take 2 mg by mouth 3 (three) times daily as needed for  anxiety.       Marland Kitchen amphetamine-dextroamphetamine (ADDERALL) 20 MG tablet Take 40 mg by mouth every morning.       . Linaclotide (LINZESS) 290 MCG CAPS capsule Take 1 capsule (290 mcg total) by mouth daily. 30 minutes before breakfast  30 capsule  11  . lisinopril (PRINIVIL,ZESTRIL) 20 MG tablet Take 20 mg by mouth every morning.       . methocarbamol (ROBAXIN) 500 MG tablet Take 1 tablet (500 mg total) by mouth every 6 (six) hours as needed for muscle spasms.  50 tablet  0  . mupirocin nasal ointment (BACTROBAN) 2 % Place 1 application into the nose 2 (two) times daily. Use one-half of tube in each nostril twice daily for five (5) days. After application, press sides of nose together and gently massage.      Marland Kitchen omeprazole (PRILOSEC) 20 MG capsule Take 1 capsule (20 mg total) by mouth daily.  30 capsule  11  . Chlorphen-Phenyleph-APAP (TYLENOL SINUS CONGESTION/PAIN) 2-5-325 & 5-325 MG MISC Take 1 tablet by mouth daily as needed (for sinus headaches).      . docusate sodium 100 MG CAPS Take 100 mg by mouth 2 (two) times daily.  10 capsule  0  . ferrous sulfate 325 (65 FE) MG tablet Take 1 tablet (325 mg total) by mouth 3 (three) times daily after meals.    3  . hyoscyamine (LEVSIN, ANASPAZ) 0.125  MG tablet Take 0.125 mg by mouth every 4 (four) hours as needed for bladder spasms.      . polyethylene glycol (MIRALAX / GLYCOLAX) packet Take 17 g by mouth 2 (two) times daily.  14 each  0  . simvastatin (ZOCOR) 20 MG tablet Take 20 mg by mouth every evening.       No current facility-administered medications for this visit.    Allergies as of 12/05/2013 - Review Complete 12/05/2013  Allergen Reaction Noted  . Morphine and related Nausea Only 01/24/2013    Family History  Problem Relation Age of Onset  . Heart attack Mother   . Colon cancer Neg Hx     History   Social History  . Marital Status: Single    Spouse Name: N/A    Number of Children: 2  . Years of Education: N/A   Social History  Main Topics  . Smoking status: Never Smoker   . Smokeless tobacco: None     Comment: Never smoked  . Alcohol Use: No  . Drug Use: No  . Sexual Activity: No   Other Topics Concern  . None   Social History Narrative  . None    Review of Systems: As mentioned in HPI.   Physical Exam: BP 112/80  Pulse 110  Temp(Src) 99.3 F (37.4 C) (Oral)  Ht 5\' 3"  (1.6 m)  Wt 129 lb 9.6 oz (58.786 kg)  BMI 22.96 kg/m2 General:   Alert and oriented. No distress noted. Pleasant and cooperative.  Head:  Normocephalic and atraumatic. Abdomen:  +BS, soft, non-tender and non-distended. No rebound or guarding. No HSM or masses noted. Msk:  Symmetrical without gross deformities. Normal posture. Extremities:  Without edema. Neurologic:  Alert and  oriented x4;  grossly normal neurologically. Skin:  Intact without significant lesions or rashes. Psych:  Alert and cooperative. Normal mood and affect.

## 2013-12-05 NOTE — Assessment & Plan Note (Signed)
Due to aspirin powder usage. Now abstaining. Surveillance EGD with healed site of prior ulcer. Continue PPI daily.

## 2013-12-05 NOTE — Assessment & Plan Note (Signed)
Persistent, with associated weight loss. Endorses significant stress. Notable loss of appetite. Likely multifactorial, but it is reassuring that recent EGD shows evidence of healed PUD. CT on file from Sept 2014. Discussed pursuing GES vs empiric treatment for possible delayed gastric emptying. She prefers the latter. I have asked her to return in 6 weeks for close follow-up.  Zofran with meals as needed Continue PPI daily Stress management 6 week return

## 2013-12-05 NOTE — Patient Instructions (Signed)
I have sent a prescription for Zofran to your pharmacy to take with meals as needed. Monitor for constipation! Please call me if things do not improve in the next week or so.  I would like to see you back in 6 weeks to check your weight and see how you are doing.

## 2013-12-10 NOTE — Progress Notes (Signed)
Cc to PCP 

## 2013-12-12 ENCOUNTER — Telehealth: Payer: Self-pay | Admitting: Internal Medicine

## 2013-12-12 NOTE — Telephone Encounter (Signed)
LMOM to call back

## 2013-12-12 NOTE — Telephone Encounter (Signed)
Pt was seen on 7/30 by AS and called today saying that she is very weak and staying nauseated. She has prescription for prilosec and zofran. She doesn't think she has been taking it long enough for it to start working because her stomach is staying in knots. Please advise and call her at [phone]

## 2013-12-12 NOTE — Telephone Encounter (Signed)
Take Zofran scheduled with meals.  Prilosec daily.  Would offer GES if persistent nausea. She has had a recent EGD with evidence of healed ulcer.

## 2013-12-17 NOTE — Telephone Encounter (Signed)
Patient stated she wants to hold off on the GES, she will think about it and let us know something next week

## 2013-12-17 NOTE — Telephone Encounter (Signed)
Tried to call pt- [name] with recommendations. Asked her to call back and speak with Serena Colonel if she wanted GES.

## 2014-01-09 ENCOUNTER — Ambulatory Visit: Payer: Medicaid Other | Admitting: Gastroenterology

## 2014-02-05 ENCOUNTER — Encounter (HOSPITAL_COMMUNITY): Payer: Self-pay

## 2014-02-05 NOTE — Progress Notes (Addendum)
Anesthesia PAT Evaluation (requested by surgeon):  Patient is a 58 year old female posted for posterior lumbar fusion (TLIF L4-5) on 02/12/14 by Dr. Rolena Infante.  Dr. Rolena Infante had requested anesthesia consult during her PAT appointment because she is seen through the [facility] with  limited appointments available, although she does report regular follow-up there which is needed for on-going prescription refills.  History includes non-smoker, HTN, GERD, palpitations (none persistent), childhood murmur, chest pain "stress" with normal stress test 02/2011, hypercholesterolemia, anxiety, depression, Bipolar 1 disorder, chronic back pain, peptic ulcers (believed to be due to ASA/Goody powder; 09/23/13 EGD showed healed ulcer), arthritis, sleeping difficulties (mind racing), appendectomy, left partial knee replacement 07/15/13 (done under spinal anesthesia). Patient lives in [place], [place]. PCP is listed as Royce Macadamia, PA-C. GI is with [facility] (Dr. Gala Romney).   Meds listed as of 02/05/14 showed: Xanax, Linzess, Tylenol, lisinopril, Robaxin, Prilosec, Zofran.    Patient seen at PAT.  VS: BP 122/80, HR 85 bpm, RR 18, O2 sat 100%. She ambulates with a cane.  She denies chest pain or SOB.  No syncope/presyncope or persistent/prolonged palpitations. She says her chest pain history was some time ago--nothing recent, and believes it was around the time when she had her stress tests in 2012. She reports occasional dyspnea on exertion which seems to be associated when her back is hurting which is worse with activity or turing certain positions.  She fell forward off of her couch shortly after her 07/2013 knee surgery. She has left back down to bilateral knee pain.  She reports associated BLE numbness and weakness which started after her knee surgery in 07/2013 and has since been told that her back issues are causing her symptoms.  She denies wheezing.  She gets occasional mild LE  edema with prolonged standing. She has been wearing TED hose.  Her activity is limited due to her back pain.  She walked from the parking deck to PAT but had to rest due to back and leg pain. She denies known CAD/MI or CHF history.  She denies any recurrent symptoms that were similar to when she had her gastric ulcer. She denies personal or family history of anesthesia complications.  On exam, heart RRR. I did not appreciate a murmur.  Lungs clear throughout.  No carotid bruits notes.  No significant LE noted (she was wearing support stockings).  Nuclear stress test on 03/02/11 showed: Normal stress nuclear study, EF 65%. Normal wall motion.  Stress echo on 02/09/11 showed: LV global systolic function was vigorous. No evidence for new LV regional wall motion abnormalities.  EKG on 02/10/14 showed NSR.  CXR on 02/10/14 showed no active cardiopulmonary disease.  Preoperative labs noted.  UA shows moderate leukocytes, negative nitrites.  Labs faxed to Dr. Valla Leaver office with confirmation.  Patient with decreased exercise tolerance due to her back pain, but otherwise is without chest pain or SOB at rest. No known history of CAD/MI, CHF or DM. Her EKG is WNL.  She tolerated left partial knee replacement six months ago. If no acute changes then I anticipate that she can proceed as planned.  George Hugh [facility]/Anesthesiology Phone [phone] 02/10/2014 3:26 PM

## 2014-02-06 ENCOUNTER — Ambulatory Visit: Payer: Medicaid Other | Admitting: Gastroenterology

## 2014-02-10 ENCOUNTER — Ambulatory Visit (HOSPITAL_COMMUNITY)
Admission: RE | Admit: 2014-02-10 | Discharge: 2014-02-10 | Disposition: A | Discharge status: Home / Self Care | Payer: Medicaid Other | Source: Ambulatory Visit | Attending: Surgery | Admitting: Surgery

## 2014-02-10 ENCOUNTER — Encounter (HOSPITAL_COMMUNITY): Payer: Self-pay

## 2014-02-10 ENCOUNTER — Encounter (HOSPITAL_COMMUNITY)
Admission: RE | Admit: 2014-02-10 | Discharge: 2014-02-10 | Disposition: A | Discharge status: Home / Self Care | Payer: Medicaid Other | Source: Ambulatory Visit | Attending: Orthopedic Surgery | Admitting: Orthopedic Surgery

## 2014-02-10 DIAGNOSIS — Z01818 Encounter for other preprocedural examination: Secondary | ICD-10-CM | POA: Insufficient documentation

## 2014-02-10 DIAGNOSIS — M549 Dorsalgia, unspecified: Secondary | ICD-10-CM | POA: Diagnosis not present

## 2014-02-10 LAB — COMPREHENSIVE METABOLIC PANEL
ALT: 16 U/L (ref 0–35)
ANION GAP: 14 (ref 5–15)
AST: 25 U/L (ref 0–37)
Albumin: 4.2 g/dL (ref 3.5–5.2)
Alkaline Phosphatase: 96 U/L (ref 39–117)
BUN: 20 mg/dL (ref 6–23)
CO2: 24 mEq/L (ref 19–32)
CREATININE: 1.02 mg/dL (ref 0.50–1.10)
Calcium: 9.7 mg/dL (ref 8.4–10.5)
Chloride: 101 mEq/L (ref 96–112)
GFR calc non Af Amer: 59 mL/min — ABNORMAL LOW (ref >90)
GFR, EST AFRICAN AMERICAN: 69 mL/min — AB (ref >90)
Glucose, Bld: 85 mg/dL (ref 70–99)
POTASSIUM: 4.4 meq/L (ref 3.7–5.3)
Sodium: 139 mEq/L (ref 137–147)
TOTAL PROTEIN: 8.3 g/dL (ref 6.0–8.3)
Total Bilirubin: 0.2 mg/dL — ABNORMAL LOW (ref 0.3–1.2)

## 2014-02-10 LAB — URINALYSIS, ROUTINE W REFLEX MICROSCOPIC
Glucose, UA: NEGATIVE mg/dL
HGB URINE DIPSTICK: NEGATIVE
KETONES UR: 15 mg/dL — AB
Nitrite: NEGATIVE
PROTEIN: NEGATIVE mg/dL
Specific Gravity, Urine: 1.031 — ABNORMAL HIGH (ref 1.005–1.030)
Urobilinogen, UA: 0.2 mg/dL (ref 0.0–1.0)
pH: 5 (ref 5.0–8.0)

## 2014-02-10 LAB — CBC
HEMATOCRIT: 40.8 % (ref 36.0–46.0)
HEMOGLOBIN: 14 g/dL (ref 12.0–15.0)
MCH: 31 pg (ref 26.0–34.0)
MCHC: 34.3 g/dL (ref 30.0–36.0)
MCV: 90.5 fL (ref 78.0–100.0)
Platelets: 278 10*3/uL (ref 150–400)
RBC: 4.51 MIL/uL (ref 3.87–5.11)
RDW: 13.3 % (ref 11.5–15.5)
WBC: 5.6 10*3/uL (ref 4.0–10.5)

## 2014-02-10 LAB — TYPE AND SCREEN
ABO/RH(D): O POS
Antibody Screen: NEGATIVE

## 2014-02-10 LAB — URINE MICROSCOPIC-ADD ON

## 2014-02-10 LAB — PROTIME-INR
INR: 1 (ref 0.00–1.49)
Prothrombin Time: 13.2 seconds (ref 11.6–15.2)

## 2014-02-10 LAB — APTT: aPTT: 28 seconds (ref 24–37)

## 2014-02-10 LAB — SURGICAL PCR SCREEN
MRSA, PCR: NEGATIVE
Staphylococcus aureus: NEGATIVE

## 2014-02-10 LAB — ABO/RH: ABO/RH(D): O POS

## 2014-02-10 NOTE — Progress Notes (Signed)
Primary - sees doctor at [facility] No cardiologist Had stress before knee operation patient couldn't remember who ordered it but it was normal

## 2014-02-10 NOTE — H&P (Addendum)
[name] is an 58 y.o. female.   History of Present Illness The patient is a 58 year old female who comes in today for a preoperative history and physical. The patient is scheduled for a TLIF L4-5 to be performed by Dr. Duane Lope D. Rolena Infante, MD at [facility] on 02/12/2014 .  Additional reasons for visit:  Back pain is described as the following: The patient is here today in referral from Dr Nelva Bush for surgical eval. The patient reports low back symptoms including pain, low back pain, tightness, stiffness, numbness (both legs into the feet) and pain with range of motion which began 1 year(s) ago without any known injury. and Symptoms include pain (that has gotten "alot worse"), decreased range of motion, paresthesias (bilateral thighs and lateral chest , left, ), numbness, tingling and stiffness, while symptoms do not include incontinence of stool or incontinence of urine. The patient describes the pain as sharp and aching. The patient describes the severity of their symptoms as severe. The patient feels as if the symptoms are worsening. Symptoms are exacerbated by standing, sitting and bending. Symptoms are not relieved by activity modification or heat packs. Current treatment includes opioid analgesics (Hydrocodone), muscle relaxants, activity modification and heating pad. Prior to being seen today the patient was previously evaluated in this clinic. Past evaluation has included x-ray of the lumbar spine and MRI of the lumbar spine. Past treatment has included non-opioid analgesics, muscle relaxants and epidural injections (10-23-13 ESI L5-S1 left did not help).  Allergies Collie Siad M Toomes; 02/10/2014 10:14 AM) Morphine Sulfate (Concentrate) *ANALGESICS - OPIOID* Ultram *ANALGESICS - OPIOID* intolerance  Family History  Diabetes Mellitus Paternal Grandfather. First Degree Relatives Cancer Maternal Grandfather, Maternal Grandmother. Hypertension Maternal Grandfather, Paternal  Grandfather. Heart Disease Mother.  Social History Tobacco use Never smoker. 03/18/2013 No history of drug/alcohol rehab Living situation live alone Number of flights of stairs before winded 1 Marital status single Tobacco / smoke exposure 03/18/2013: no Not under pain contract Former drinker 03/18/2013: In the past drank beer only occasionally per week Children 2 Current work status disabled Exercise Exercises weekly; does other  Medication History  Robaxin (500MG  Tablet, 1 (one) Tablet Oral po tid prn spasms, Taken starting 02/05/2014) Active. Norco (5-325MG  Tablet, 1 (one) Oral po q 8hrs prn pain, Taken starting 02/05/2014) Active. Amoxicillin (500MG  Capsule, 4 Capsule Oral po 1 hr prior to dental appt, Taken starting 11/12/2013) Active. Robaxin (prn) Active. ALPRAZolam ER (1MG  Tablet ER 24HR, Oral) Active. Amphetamine-Dextroamphet ER (20MG  Capsule ER 24HR, Oral) Active. LamoTRIgine ER (200MG  Tablet ER 24HR, Oral) Active. Lisinopril (20MG  Tablet, Oral) Active. Omeprazole (20MG  Tablet DR, Oral) Active. Ondansetron HCl (4MG  Tablet, Oral) Active. Polyethylene Glycol (External) Active. Promethazine HCl (25MG  Tablet, Oral) Active. Simvastatin (20MG  Tablet, Oral) Active. Medications Reconciled  Vitals  02/10/2014 10:15 AM Weight: 129 lb Height: 63in Body Surface Area: 1.61 m Body Mass Index: 22.85 kg/m Temp.: 97.66F  Pulse: 77 (Regular)  BP: 112/76 (Sitting, Left Arm, Standard)      Past Medical History  Diagnosis Date  . Hiatal hernia   . Palpitations   . Anxiety   . Hypertension   . Hypercholesterolemia   . Depression   . Heart murmur   . Chest pain     "STRESS"  . Arthritis   . GERD (gastroesophageal reflux disease)   . History of stomach ulcers   . Difficulty sleeping   . Bipolar 1 disorder   . Back pain, chronic     Past Surgical  History  Procedure Laterality Date  . Appendectomy    . Carpal tunnel release       BILATERAL  . Colonoscopy, esophagogastroduodenoscopy (egd) and esophageal dilation N/A 02/06/2013    RMR. Benign colon polyp, diverticulosis, hemorrhoids. EGD with Schatzki's ring s/p 60 F dilation, 47mm elliptical prepyloric antral ulcer with adjacent scarring, negative path  . Partial knee arthroplasty Left 07/15/2013    Procedure: UNICOMPARTMENTAL LEFT KNEE MEDIAL ;  Surgeon: [name], MD;  Location: [facility] ORS;  Service: Orthopedics;  Laterality: Left;  . Esophagogastroduodenoscopy N/A 09/23/2013    Dr. Gala Romney: non-critical Schatzki's ring, not manipulated, healed ulcer, gastric bx negative    Family History  Problem Relation Age of Onset  . Heart attack Mother   . Colon cancer Neg Hx    Social History:  reports that she has never smoked. She does not have any smokeless tobacco history on file. She reports that she does not drink alcohol or use illicit drugs.  Allergies:  Allergies  Allergen Reactions  . Morphine And Related Nausea Only    Dizziness     No prescriptions prior to admission    No results found for this or any previous visit (from the past 48 hour(s)). No results found.  Review of Systems  Constitutional: Negative.   HENT: Negative.   Eyes: Negative.   Respiratory: Negative.   Cardiovascular: Negative.   Gastrointestinal: Negative.   Genitourinary: Negative.   Musculoskeletal: Positive for back pain and myalgias.  Neurological: Positive for tingling.  Psychiatric/Behavioral: Negative.     There were no vitals taken for this visit. Physical Exam  Constitutional: She is oriented to person, place, and time. She appears well-developed and well-nourished.  HENT:  Head: Normocephalic and atraumatic.  Eyes: EOM are normal. Pupils are equal, round, and reactive to light.  Neck: Normal range of motion.  Cardiovascular: Normal rate and regular rhythm.   Respiratory: Effort normal and breath sounds normal.  GI: Soft. Bowel sounds are normal.  Musculoskeletal:  She exhibits tenderness.  Neurological: She is alert and oriented to person, place, and time.  Skin: Skin is warm and dry.  Psychiatric: She has a normal mood and affect.     Assessment & Plan  Spondylolisthesis, acquired (M43.19)    Plans: Will proceed with L4-5 TLIF as scheduled.  All questions answered.     Posterior decompression/Fusion:Risks of surgery include infection, bleeding, nerve damage, death, stroke, paralysis, failure to heal, need for further surgery, ongoing or worse pain, need for further surgery, CSF leak, loss of bowel or bladder, and recurrent disc herniation or stenosis which would necessitate need for further surgery. Non-union, hardware failure, adjacent segment disease and recurrent pain. Hardware breakage, mal-position requiring surgery to correct or remove.  Follow up 2 weeks posotp.    OWENS,JAMES M 02/10/2014, 10:39 AM  Patient doing well All questions/concerns addressed Increased left leg pain - plan on TLIF with left side cage insertion Pre-op UA showed bacteria - patient treated with oral ABX and denies dysuria H+P reviewed no changes Clinical exam unchanged

## 2014-02-10 NOTE — Pre-Procedure Instructions (Addendum)
[name]  02/10/2014   Your procedure is scheduled on:  Wednesday, October 7th  Report to [facility] Admitting at 630 AM.  Call this number if you have problems the morning of surgery: [phone]   Remember:   Do not eat food or drink liquids after midnight.   Take these medicines the morning of surgery with A SIP OF WATER: prilosec, tylenol if needed, xanax if needed   Do not wear jewelry, make-up or nail polish.  Do not wear lotions, powders, or perfumes. You may wear deodorant.  Do not shave 48 hours prior to surgery. Men may shave face and neck.  Do not bring valuables to the hospital.  [facility] is not responsible  for any belongings or valuables.               Contacts, dentures or bridgework may not be worn into surgery.  Leave suitcase in the car. After surgery it may be brought to your room.  For patients admitted to the hospital, discharge time is determined by your treatment team.           Please read over the following fact sheets that you were given: Pain Booklet, Coughing and Deep Breathing, Blood Transfusion Information, MRSA Information and Surgical Site Infection Prevention [place] - Preparing for Surgery  Before surgery, you can play an important role.  Because skin is not sterile, your skin needs to be as free of germs as possible.  You can reduce the number of germs on you skin by washing with CHG (chlorahexidine gluconate) soap before surgery.  CHG is an antiseptic cleaner which kills germs and bonds with the skin to continue killing germs even after washing.  Please DO NOT use if you have an allergy to CHG or antibacterial soaps.  If your skin becomes reddened/irritated stop using the CHG and inform your nurse when you arrive at Short Stay.  Do not shave (including legs and underarms) for at least 48 hours prior to the first CHG shower.  You may shave your face.  Please follow these instructions carefully:   1.  Shower with CHG Soap the  night before surgery and the morning of Surgery.  2.  If you choose to wash your hair, wash your hair first as usual with your normal shampoo.  3.  After you shampoo, rinse your hair and body thoroughly to remove the shampoo.  4.  Use CHG as you would any other liquid soap.  You can apply CHG directly to the skin and wash gently with scrungie or a clean washcloth.  5.  Apply the CHG Soap to your body ONLY FROM THE NECK DOWN.  Do not use on open wounds or open sores.  Avoid contact with your eyes, ears, mouth and genitals (private parts).  Wash genitals (private parts) with your normal soap.  6.  Wash thoroughly, paying special attention to the area where your surgery will be performed.  7.  Thoroughly rinse your body with warm water from the neck down.  8.  DO NOT shower/wash with your normal soap after using and rinsing off the CHG Soap.  9.  Pat yourself dry with a clean towel.            10.  Wear clean pajamas.            11.  Place clean sheets on your bed the night of your first shower and do not sleep with pets.  Day  of Surgery  Do not apply any lotions/deoderants the morning of surgery.  Please wear clean clothes to the hospital/surgery center.

## 2014-02-11 ENCOUNTER — Encounter (HOSPITAL_COMMUNITY): Payer: Self-pay | Admitting: Anesthesiology

## 2014-02-11 MED ORDER — DEXAMETHASONE SODIUM PHOSPHATE 4 MG/ML IJ SOLN
4.0000 mg | INTRAMUSCULAR | Status: AC
  Administered 2014-02-12: 4 mg via INTRAVENOUS
  Filled 2014-02-11: qty 1

## 2014-02-11 MED ORDER — ACETAMINOPHEN 10 MG/ML IV SOLN
1000.0000 mg | INTRAVENOUS | Status: AC
  Administered 2014-02-12: 1000 mg via INTRAVENOUS

## 2014-02-11 MED ORDER — CEFAZOLIN SODIUM-DEXTROSE 2-3 GM-% IV SOLR
2.0000 g | INTRAVENOUS | Status: AC
  Administered 2014-02-12 (×2): 2 g via INTRAVENOUS
  Filled 2014-02-11: qty 50

## 2014-02-12 ENCOUNTER — Inpatient Hospital Stay (HOSPITAL_COMMUNITY)
Admission: RE | Admit: 2014-02-12 | Discharge: 2014-02-13 | DRG: 460 | Disposition: A | Discharge status: Home / Self Care | Payer: Medicaid Other | Source: Ambulatory Visit | Attending: Orthopedic Surgery | Admitting: Orthopedic Surgery

## 2014-02-12 ENCOUNTER — Inpatient Hospital Stay (HOSPITAL_COMMUNITY): Payer: Medicaid Other

## 2014-02-12 ENCOUNTER — Ambulatory Visit (HOSPITAL_COMMUNITY): Payer: Medicaid Other | Admitting: Anesthesiology

## 2014-02-12 ENCOUNTER — Encounter (HOSPITAL_COMMUNITY): Payer: Medicaid Other | Admitting: Vascular Surgery

## 2014-02-12 ENCOUNTER — Encounter (HOSPITAL_COMMUNITY)
Admission: RE | Disposition: A | Discharge status: Home / Self Care | Payer: Self-pay | Source: Ambulatory Visit | Attending: Orthopedic Surgery

## 2014-02-12 ENCOUNTER — Encounter (HOSPITAL_COMMUNITY): Payer: Self-pay | Admitting: Anesthesiology

## 2014-02-12 DIAGNOSIS — M549 Dorsalgia, unspecified: Secondary | ICD-10-CM | POA: Diagnosis present

## 2014-02-12 DIAGNOSIS — K219 Gastro-esophageal reflux disease without esophagitis: Secondary | ICD-10-CM | POA: Diagnosis present

## 2014-02-12 DIAGNOSIS — I10 Essential (primary) hypertension: Secondary | ICD-10-CM | POA: Diagnosis present

## 2014-02-12 DIAGNOSIS — M4326 Fusion of spine, lumbar region: Secondary | ICD-10-CM

## 2014-02-12 DIAGNOSIS — M4316 Spondylolisthesis, lumbar region: Principal | ICD-10-CM | POA: Diagnosis present

## 2014-02-12 DIAGNOSIS — M545 Low back pain: Secondary | ICD-10-CM | POA: Diagnosis present

## 2014-02-12 HISTORY — PX: LUMBAR FUSION: SHX111

## 2014-02-12 SURGERY — POSTERIOR LUMBAR FUSION 1 LEVEL
Anesthesia: General | Site: Back

## 2014-02-12 MED ORDER — ARTIFICIAL TEARS OP OINT
TOPICAL_OINTMENT | OPHTHALMIC | Status: AC
  Filled 2014-02-12: qty 3.5

## 2014-02-12 MED ORDER — SUCCINYLCHOLINE CHLORIDE 20 MG/ML IJ SOLN
INTRAMUSCULAR | Status: DC | PRN
  Administered 2014-02-12: 50 mg via INTRAVENOUS

## 2014-02-12 MED ORDER — SODIUM CHLORIDE 0.9 % IJ SOLN: 3.0000 mL | Freq: Two times a day (BID) | INTRAMUSCULAR | Status: DC

## 2014-02-12 MED ORDER — MAGNESIUM CITRATE PO SOLN: 1.0000 | Freq: Once | ORAL | Status: DC | PRN

## 2014-02-12 MED ORDER — SODIUM CHLORIDE 0.9 % IV SOLN
0.2500 ug/kg/h | INTRAVENOUS | Status: AC
  Administered 2014-02-12: .2 ug/kg/h via INTRAVENOUS
  Filled 2014-02-12 (×3): qty 5

## 2014-02-12 MED ORDER — ONDANSETRON HCL 4 MG/2ML IJ SOLN
INTRAMUSCULAR | Status: AC
  Filled 2014-02-12: qty 2

## 2014-02-12 MED ORDER — LACTATED RINGERS IV SOLN
INTRAVENOUS | Status: DC
  Administered 2014-02-12: 85 mL/h via INTRAVENOUS

## 2014-02-12 MED ORDER — OXYCODONE HCL 5 MG/5ML PO SOLN: 5.0000 mg | Freq: Once | ORAL | Status: AC | PRN

## 2014-02-12 MED ORDER — GLYCOPYRROLATE 0.2 MG/ML IJ SOLN
INTRAMUSCULAR | Status: AC
  Filled 2014-02-12: qty 2

## 2014-02-12 MED ORDER — ONDANSETRON HCL 4 MG/2ML IJ SOLN
INTRAMUSCULAR | Status: DC | PRN
  Administered 2014-02-12: 4 mg via INTRAVENOUS

## 2014-02-12 MED ORDER — OXYCODONE HCL 5 MG PO TABS
5.0000 mg | ORAL_TABLET | Freq: Once | ORAL | Status: AC | PRN
  Administered 2014-02-12: 5 mg via ORAL

## 2014-02-12 MED ORDER — OXYCODONE HCL 5 MG PO TABS
10.0000 mg | ORAL_TABLET | ORAL | Status: DC | PRN
  Administered 2014-02-12: 10 mg via ORAL
  Administered 2014-02-12: 5 mg via ORAL
  Administered 2014-02-12 – 2014-02-13 (×3): 10 mg via ORAL
  Filled 2014-02-12 (×4): qty 2

## 2014-02-12 MED ORDER — EPHEDRINE SULFATE 50 MG/ML IJ SOLN
INTRAMUSCULAR | Status: AC
  Filled 2014-02-12: qty 1

## 2014-02-12 MED ORDER — ONDANSETRON HCL 4 MG/2ML IJ SOLN
4.0000 mg | INTRAMUSCULAR | Status: DC | PRN
Start: 2014-02-12 — End: 2014-02-13
  Administered 2014-02-12: 4 mg via INTRAVENOUS
  Filled 2014-02-12: qty 2

## 2014-02-12 MED ORDER — MIDAZOLAM HCL 2 MG/2ML IJ SOLN
INTRAMUSCULAR | Status: AC
  Filled 2014-02-12: qty 2

## 2014-02-12 MED ORDER — LIDOCAINE HCL (CARDIAC) 20 MG/ML IV SOLN
INTRAVENOUS | Status: DC | PRN
  Administered 2014-02-12: 60 mg via INTRAVENOUS

## 2014-02-12 MED ORDER — PROPOFOL 10 MG/ML IV BOLUS
INTRAVENOUS | Status: AC
  Filled 2014-02-12: qty 20

## 2014-02-12 MED ORDER — OXYCODONE HCL 5 MG PO TABS
ORAL_TABLET | ORAL | Status: AC
  Filled 2014-02-12: qty 1

## 2014-02-12 MED ORDER — DEXAMETHASONE 4 MG PO TABS
4.0000 mg | ORAL_TABLET | Freq: Four times a day (QID) | ORAL | Status: DC
  Administered 2014-02-12: 4 mg via ORAL
  Filled 2014-02-12 (×7): qty 1

## 2014-02-12 MED ORDER — HYDROMORPHONE HCL 1 MG/ML IJ SOLN
INTRAMUSCULAR | Status: AC
  Administered 2014-02-12: 1 mg
  Filled 2014-02-12: qty 1

## 2014-02-12 MED ORDER — FENTANYL CITRATE 0.05 MG/ML IJ SOLN
INTRAMUSCULAR | Status: DC | PRN
  Administered 2014-02-12: 100 ug via INTRAVENOUS
  Administered 2014-02-12 (×3): 50 ug via INTRAVENOUS

## 2014-02-12 MED ORDER — FENTANYL CITRATE 0.05 MG/ML IJ SOLN
INTRAMUSCULAR | Status: AC
  Filled 2014-02-12: qty 5

## 2014-02-12 MED ORDER — SODIUM CHLORIDE 0.9 % IV SOLN: 250.0000 mL | INTRAVENOUS | Status: DC

## 2014-02-12 MED ORDER — METHOCARBAMOL 1000 MG/10ML IJ SOLN
500.0000 mg | Freq: Four times a day (QID) | INTRAVENOUS | Status: DC | PRN
  Filled 2014-02-12: qty 5

## 2014-02-12 MED ORDER — SODIUM CHLORIDE 0.9 % IJ SOLN: 3.0000 mL | INTRAMUSCULAR | Status: DC | PRN

## 2014-02-12 MED ORDER — THROMBIN 20000 UNITS EX SOLR
OROMUCOSAL | Status: DC | PRN
  Administered 2014-02-12: 09:00:00 via TOPICAL

## 2014-02-12 MED ORDER — HYDROMORPHONE HCL 1 MG/ML IJ SOLN
INTRAMUSCULAR | Status: AC
  Filled 2014-02-12: qty 1

## 2014-02-12 MED ORDER — LISINOPRIL 20 MG PO TABS
20.0000 mg | ORAL_TABLET | Freq: Every morning | ORAL | Status: DC
  Filled 2014-02-12: qty 1

## 2014-02-12 MED ORDER — 0.9 % SODIUM CHLORIDE (POUR BTL) OPTIME
TOPICAL | Status: DC | PRN
  Administered 2014-02-12: 1000 mL

## 2014-02-12 MED ORDER — SUCCINYLCHOLINE CHLORIDE 20 MG/ML IJ SOLN
INTRAMUSCULAR | Status: AC
Start: 2014-02-12 — End: 2014-02-12
  Filled 2014-02-12: qty 1

## 2014-02-12 MED ORDER — ACETAMINOPHEN 10 MG/ML IV SOLN
INTRAVENOUS | Status: AC
  Filled 2014-02-12: qty 100

## 2014-02-12 MED ORDER — METHOCARBAMOL 500 MG PO TABS
ORAL_TABLET | ORAL | Status: AC
  Administered 2014-02-12: 500 mg
  Filled 2014-02-12: qty 1

## 2014-02-12 MED ORDER — MORPHINE SULFATE 2 MG/ML IJ SOLN: 1.0000 mg | INTRAMUSCULAR | Status: DC | PRN

## 2014-02-12 MED ORDER — MIDAZOLAM HCL 5 MG/5ML IJ SOLN
INTRAMUSCULAR | Status: DC | PRN
  Administered 2014-02-12: 2 mg via INTRAVENOUS

## 2014-02-12 MED ORDER — PHENYLEPHRINE 40 MCG/ML (10ML) SYRINGE FOR IV PUSH (FOR BLOOD PRESSURE SUPPORT)
PREFILLED_SYRINGE | INTRAVENOUS | Status: AC
  Filled 2014-02-12: qty 10

## 2014-02-12 MED ORDER — HYDROMORPHONE HCL 1 MG/ML IJ SOLN
0.2500 mg | INTRAMUSCULAR | Status: DC | PRN
  Administered 2014-02-12 (×4): 0.5 mg via INTRAVENOUS

## 2014-02-12 MED ORDER — HEMOSTATIC AGENTS (NO CHARGE) OPTIME
TOPICAL | Status: DC | PRN
  Administered 2014-02-12: 1 via TOPICAL

## 2014-02-12 MED ORDER — ALPRAZOLAM 0.5 MG PO TABS
2.0000 mg | ORAL_TABLET | Freq: Three times a day (TID) | ORAL | Status: DC | PRN
  Administered 2014-02-12: 2 mg via ORAL
  Filled 2014-02-12: qty 4

## 2014-02-12 MED ORDER — DOCUSATE SODIUM 100 MG PO CAPS
100.0000 mg | ORAL_CAPSULE | Freq: Two times a day (BID) | ORAL | Status: DC
  Administered 2014-02-12 – 2014-02-13 (×3): 100 mg via ORAL
  Filled 2014-02-12 (×2): qty 1

## 2014-02-12 MED ORDER — NEOSTIGMINE METHYLSULFATE 10 MG/10ML IV SOLN
INTRAVENOUS | Status: AC
  Filled 2014-02-12: qty 1

## 2014-02-12 MED ORDER — DEXAMETHASONE SODIUM PHOSPHATE 4 MG/ML IJ SOLN
4.0000 mg | Freq: Four times a day (QID) | INTRAMUSCULAR | Status: DC
  Administered 2014-02-12 – 2014-02-13 (×2): 4 mg via INTRAVENOUS
  Filled 2014-02-12 (×7): qty 1

## 2014-02-12 MED ORDER — THROMBIN 20000 UNITS EX SOLR
CUTANEOUS | Status: AC
  Filled 2014-02-12: qty 20000

## 2014-02-12 MED ORDER — BUPIVACAINE-EPINEPHRINE 0.25% -1:200000 IJ SOLN
INTRAMUSCULAR | Status: DC | PRN
  Administered 2014-02-12: 10 mL

## 2014-02-12 MED ORDER — BUPIVACAINE-EPINEPHRINE (PF) 0.25% -1:200000 IJ SOLN
INTRAMUSCULAR | Status: AC
  Filled 2014-02-12: qty 30

## 2014-02-12 MED ORDER — MENTHOL 3 MG MT LOZG: 1.0000 | LOZENGE | OROMUCOSAL | Status: DC | PRN

## 2014-02-12 MED ORDER — CEFAZOLIN SODIUM 1-5 GM-% IV SOLN
1.0000 g | Freq: Three times a day (TID) | INTRAVENOUS | Status: AC
  Administered 2014-02-12 – 2014-02-13 (×2): 1 g via INTRAVENOUS
  Filled 2014-02-12 (×3): qty 50

## 2014-02-12 MED ORDER — METHOCARBAMOL 500 MG PO TABS
500.0000 mg | ORAL_TABLET | Freq: Four times a day (QID) | ORAL | Status: DC | PRN
  Administered 2014-02-13: 500 mg via ORAL
  Filled 2014-02-12 (×2): qty 1

## 2014-02-12 MED ORDER — PROPOFOL 10 MG/ML IV BOLUS
INTRAVENOUS | Status: DC | PRN
  Administered 2014-02-12: 130 mg via INTRAVENOUS
  Administered 2014-02-12: 20 mg via INTRAVENOUS

## 2014-02-12 MED ORDER — PHENOL 1.4 % MT LIQD: 1.0000 | OROMUCOSAL | Status: DC | PRN

## 2014-02-12 MED ORDER — PHENYLEPHRINE HCL 10 MG/ML IJ SOLN
INTRAMUSCULAR | Status: DC | PRN
  Administered 2014-02-12 (×5): 40 ug via INTRAVENOUS

## 2014-02-12 MED ORDER — ACETAMINOPHEN 10 MG/ML IV SOLN
1000.0000 mg | Freq: Four times a day (QID) | INTRAVENOUS | Status: DC
  Administered 2014-02-12 – 2014-02-13 (×3): 1000 mg via INTRAVENOUS
  Filled 2014-02-12 (×4): qty 100

## 2014-02-12 MED ORDER — ROCURONIUM BROMIDE 50 MG/5ML IV SOLN
INTRAVENOUS | Status: AC
  Filled 2014-02-12: qty 1

## 2014-02-12 MED ORDER — LACTATED RINGERS IV SOLN
INTRAVENOUS | Status: DC | PRN
  Administered 2014-02-12 (×3): via INTRAVENOUS

## 2014-02-12 SURGICAL SUPPLY — 75 items
BLADE SURG ROTATE 9660 (MISCELLANEOUS) IMPLANT
BUR EGG ELITE 4.0 (BURR) IMPLANT
BUR EGG ELITE 4.0MM (BURR)
CAGE TLIF XL 10 SPINE (Cage) ×2 IMPLANT
CAGE TLIF XL 10MM SPINE (Cage) ×1 IMPLANT
CLIP NEUROVISION LG (CLIP) ×3 IMPLANT
CLOSURE STERI-STRIP 1/2X4 (GAUZE/BANDAGES/DRESSINGS) ×1
CLSR STERI-STRIP ANTIMIC 1/2X4 (GAUZE/BANDAGES/DRESSINGS) ×2 IMPLANT
CORDS BIPOLAR (ELECTRODE) ×3 IMPLANT
COVER MAYO STAND STRL (DRAPES) ×6 IMPLANT
COVER SURGICAL LIGHT HANDLE (MISCELLANEOUS) ×3 IMPLANT
DRAPE C-ARM 42X72 X-RAY (DRAPES) ×3 IMPLANT
DRAPE C-ARMOR (DRAPES) ×3 IMPLANT
DRAPE ORTHO SPLIT 77X108 STRL (DRAPES) ×2
DRAPE POUCH INSTRU U-SHP 10X18 (DRAPES) ×3 IMPLANT
DRAPE SURG 17X23 STRL (DRAPES) ×3 IMPLANT
DRAPE SURG ORHT 6 SPLT 77X108 (DRAPES) ×1 IMPLANT
DRAPE U-SHAPE 47X51 STRL (DRAPES) ×3 IMPLANT
DRSG MEPILEX BORDER 4X8 (GAUZE/BANDAGES/DRESSINGS) ×3 IMPLANT
DURAPREP 26ML APPLICATOR (WOUND CARE) ×3 IMPLANT
ELECT BLADE 4.0 EZ CLEAN MEGAD (MISCELLANEOUS) ×3
ELECT BLADE 6.5 EXT (BLADE) ×3 IMPLANT
ELECT PENCIL ROCKER SW 15FT (MISCELLANEOUS) ×3 IMPLANT
ELECT REM PT RETURN 9FT ADLT (ELECTROSURGICAL) ×3
ELECTRODE BLDE 4.0 EZ CLN MEGD (MISCELLANEOUS) ×1 IMPLANT
ELECTRODE REM PT RTRN 9FT ADLT (ELECTROSURGICAL) ×1 IMPLANT
GLOVE BIOGEL PI IND STRL 8 (GLOVE) ×1 IMPLANT
GLOVE BIOGEL PI IND STRL 8.5 (GLOVE) ×1 IMPLANT
GLOVE BIOGEL PI INDICATOR 8 (GLOVE) ×2
GLOVE BIOGEL PI INDICATOR 8.5 (GLOVE) ×2
GLOVE ECLIPSE 8.5 STRL (GLOVE) ×3 IMPLANT
GLOVE ORTHO TXT STRL SZ7.5 (GLOVE) ×3 IMPLANT
GOWN STRL REUS W/ TWL LRG LVL3 (GOWN DISPOSABLE) ×1 IMPLANT
GOWN STRL REUS W/TWL 2XL LVL3 (GOWN DISPOSABLE) ×6 IMPLANT
GOWN STRL REUS W/TWL LRG LVL3 (GOWN DISPOSABLE) ×2
GUIDEWIRE NITINOL BEVEL TIP (WIRE) ×12 IMPLANT
KIT BASIN OR (CUSTOM PROCEDURE TRAY) ×3 IMPLANT
KIT NEEDLE NVM5 EMG ELECT (KITS) ×1 IMPLANT
KIT NEEDLE NVM5 EMG ELECTRODE (KITS) ×2
KIT POSITION SURG JACKSON T1 (MISCELLANEOUS) ×3 IMPLANT
KIT ROOM TURNOVER OR (KITS) ×3 IMPLANT
LIGHT SOURCE ANGLE TIP STR 7FT (MISCELLANEOUS) ×3 IMPLANT
MAS TLIF HOOP SHIM (KITS) ×3 IMPLANT
NEEDLE 22X1 1/2 (OR ONLY) (NEEDLE) ×3 IMPLANT
NEEDLE I-PASS III (NEEDLE) ×3 IMPLANT
NEEDLE SPNL 18GX3.5 QUINCKE PK (NEEDLE) ×6 IMPLANT
NS IRRIG 1000ML POUR BTL (IV SOLUTION) ×3 IMPLANT
PACK LAMINECTOMY ORTHO (CUSTOM PROCEDURE TRAY) ×3 IMPLANT
PACK UNIVERSAL I (CUSTOM PROCEDURE TRAY) ×3 IMPLANT
PAD ARMBOARD 7.5X6 YLW CONV (MISCELLANEOUS) ×6 IMPLANT
PATTIES SURGICAL .5 X.5 (GAUZE/BANDAGES/DRESSINGS) IMPLANT
PATTIES SURGICAL .5 X1 (DISPOSABLE) ×3 IMPLANT
PRECEPT SHANK CANN 6.5X40 (Neuro Prosthesis/Implant) ×6 IMPLANT
PRECEPT TULIPS (Neuro Prosthesis/Implant) ×6 IMPLANT
PROBE BALL TIP NVM5 SNG USE (BALLOONS) ×3 IMPLANT
PUTTY BONE DBX 5CC MIX (Putty) ×3 IMPLANT
ROD 45MM (Rod) ×3 IMPLANT
ROD 50MM (Rod) ×3 IMPLANT
SCREW PRECEPT 6.5X40 (Screw) ×6 IMPLANT
SCREW PRECEPT SET (Screw) ×12 IMPLANT
SPONGE LAP 4X18 X RAY DECT (DISPOSABLE) ×6 IMPLANT
SPONGE SURGIFOAM ABS GEL 100 (HEMOSTASIS) ×3 IMPLANT
SURGIFLO TRUKIT (HEMOSTASIS) IMPLANT
SUT BONE WAX W31G (SUTURE) ×3 IMPLANT
SUT MON AB 3-0 SH 27 (SUTURE) ×4
SUT MON AB 3-0 SH27 (SUTURE) ×2 IMPLANT
SUT VIC AB 1 CTX 18 (SUTURE) ×3 IMPLANT
SUT VIC AB 2-0 CT1 18 (SUTURE) ×3 IMPLANT
SYR BULB IRRIGATION 50ML (SYRINGE) ×3 IMPLANT
SYR CONTROL 10ML LL (SYRINGE) ×3 IMPLANT
TOWEL OR 17X24 6PK STRL BLUE (TOWEL DISPOSABLE) ×3 IMPLANT
TOWEL OR 17X26 10 PK STRL BLUE (TOWEL DISPOSABLE) ×3 IMPLANT
TRAY FOLEY CATH 16FRSI W/METER (SET/KITS/TRAYS/PACK) ×3 IMPLANT
WATER STERILE IRR 1000ML POUR (IV SOLUTION) ×3 IMPLANT
YANKAUER SUCT BULB TIP NO VENT (SUCTIONS) ×3 IMPLANT

## 2014-02-12 NOTE — Evaluation (Signed)
Physical Therapy Evaluation Patient Details Name: [name] MRN: [id number] DOB: 11/11/1955 Today's Date: 02/12/2014   History of Present Illness  58 y.o. female admitted to [facility] on 02/12/14 for elective L4/5 decompression, discectomy, and fusion.  Pt with significant PMHx of HTN, anxiety/depression, bipolar disorder, L partial knee arthroplasty, and bil carpal tunnel release.    Clinical Impression  Pt is POD #0 and is moving well min guard assist with RW.  Back education and precautions initiated, but reinforcement will be needed.  I anticipate that all education and mobility training can be completed acutely and that pt will not need any f/u PT at discharge and pt has all equipment needed from prior orthopedic surgeries.   PT to follow acutely for deficits listed below.       Follow Up Recommendations No PT follow up;Supervision - Intermittent    Equipment Recommendations  None recommended by PT    Recommendations for Other Services   NA    Precautions / Restrictions Precautions Precautions: Back Precaution Booklet Issued: Yes (comment) Precaution Comments: handout given and reviewed Required Braces or Orthoses: Spinal Brace Spinal Brace: Lumbar corset;Applied in sitting position (no orders, but brace in room)      Mobility     Transfers Overall transfer level: Needs assistance Equipment used: Rolling walker (2 wheeled) Transfers: Sit to/from Stand Sit to Stand: Min guard         General transfer comment: Min guard assist for safety as pt with slow transitions and heavy reliance on hands for support on armrests.  Verbal cues for safe hand placement.   Ambulation/Gait Ambulation/Gait assistance: Min guard Ambulation Distance (Feet): 20 Feet Assistive device: Rolling walker (2 wheeled) Gait Pattern/deviations: Step-through pattern;Trunk flexed     General Gait Details: verbal cues for upright posture and to stay closer to RW during gait.  Pt has already walked  down the hallway with RN staff, so did not feel the need for further ambulation at this time.          Balance Overall balance assessment: Needs assistance Sitting-balance support: Feet supported;No upper extremity supported Sitting balance-Leahy Scale: Good     Standing balance support: Bilateral upper extremity supported Standing balance-Leahy Scale: Poor Standing balance comment: needs external support at this time.                              Pertinent Vitals/Pain Pain Assessment: 0-10 Pain Score: 8  Pain Location: low back bil hips (posterior) Pain Descriptors / Indicators: Aching;Burning Pain Intervention(s): Limited activity within patient's tolerance;Monitored during session;Repositioned;Patient requesting pain meds-RN notified    Home Living Family/patient expects to be discharged to:: Private residence Living Arrangements: Children (son works 3rd shift) Available Help at Discharge: Family;Available 24 hours/day Type of Home: Mobile home Home Access: Stairs to enter Entrance Stairs-Rails: Right;Left Entrance Stairs-Number of Steps: 2 separated steps  Home Layout: One level Home Equipment: Walker - 2 wheels;Bedside commode;Tub bench      Prior Function Level of Independence: Independent with assistive device(s)         Comments: had started to use the RW again        Extremity/Trunk Assessment   Upper Extremity Assessment: Defer to OT evaluation           Lower Extremity Assessment: Overall WFL for tasks assessed (seated MMT at least 3/5 bil legs, per pt she needs a R TKA)      Cervical /  Trunk Assessment: Normal  Communication   Communication: No difficulties  Cognition Arousal/Alertness: Awake/alert Behavior During Therapy: WFL for tasks assessed/performed Overall Cognitive Status: Within Functional Limits for tasks assessed                               Assessment/Plan    PT Assessment Patient needs continued  PT services  PT Diagnosis Difficulty walking;Abnormality of gait;Generalized weakness;Acute pain   PT Problem List Decreased strength;Decreased activity tolerance;Decreased balance;Decreased mobility;Decreased knowledge of use of DME;Decreased knowledge of precautions;Pain  PT Treatment Interventions DME instruction;Gait training;Stair training;Functional mobility training;Therapeutic activities;Therapeutic exercise;Balance training;Patient/family education;Neuromuscular re-education;Modalities   PT Goals (Current goals can be found in the Care Plan section) Acute Rehab PT Goals Patient Stated Goal: to decrease her back pain PT Goal Formulation: With patient/family Time For Goal Achievement: 02/19/14 Potential to Achieve Goals: Good    Frequency Min 5X/week           End of Session Equipment Utilized During Treatment: Back brace Activity Tolerance: Patient limited by pain Patient left: in chair;with call bell/phone within reach;with family/visitor present Nurse Communication: Patient requests pain meds         Time: 4801-6553 PT Time Calculation (min): 19 min   Charges:   PT Evaluation $Initial PT Evaluation Tier I: 1 Procedure PT Treatments $Self Care/Home Management: 8-22        Shaquil Aldana B. Lyrique Hakim, PT, DPT [phone]   02/12/2014, 5:40 PM

## 2014-02-12 NOTE — Op Note (Signed)
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 Gill decompression left side, L4-5. 2. Segmental instrumentation, L4-5 using NuVasive MIS pedicle screw     system. 3. Posterolateral arthrodesis, L4-5. 4. Complete lumbar diskectomy, L4-5 with insertion of biomechanical     intervertebral device utilizing DBX mix plus local autograft bone.  FIRST ASSISTANT:  Benjiman Core, PA  HISTORY:  This is a very pleasant young lady who has been having severe back, buttock, and radicular left leg pain.  All appropriate risks, benefits, and alternatives were discussed with the patient.  Consent was obtained.  OPERATIVE NOTE:  The patient was brought to the operating room, placed supine on the operating room table.  After successful induction of general anesthesia and endotracheal intubation, TEDs, SCDs, and Foley were inserted.  All appropriate needles were placed for intraoperative EMG neuro monitoring.  She was turned prone onto the Wilson frame and all bony prominences were well padded.  The back was prepped and draped in a standard fashion.  Time-out was taken to confirm patient, procedure, and all other pertinent important data.  Once this was completed, a C-arm was used to identify the lateral border of the L4 and L5 pedicle on the right side.  Once this was done, a small incision was made on the lateral aspect.  A Jamshidi needle was advanced percutaneously down to the lateral aspect of the facet complex.  I identified the proper position and trajectory using fluoroscopy and then advanced the Jamshidi  needle while stimulating and viewing on imaging study.  Once I had the trajectory positioned and was able to find an appropriate stop, I then placed a guide pin to cannulate the pedicle.  I then repeated this procedure at L5.  Once both pedicles were cannulated and tapped, I then measured and then placed the appropriate size screws which were 40 mm length 6.5 diameter screws.  Both pedicle screws were stimulated directly, and there was no neurodiagnostic evidence of any breach.  Once this was completed, I went to the contralateral side. Since this was the more symptomatic side, I elected to do a mini open. I made a small incision along the lateral aspect of the 4-5 pedicle, and bluntly dissected through the paraspinal muscles.  I then placed the Jamshidi needles on the lateral aspect of the L4 pedicle and using the same technique I used on the contralateral side advanced the Jamshidi needles to appropriate position in the pedicle.  I then placed a guide pin.  I then repeated this at L5.  Once this was done, I then placed the pedicle screw with the retracting blade length to it.  I now had visualization of the posterolateral aspect of the spine.  I confirmed position and trajectory of all 4 screws and stimulated  all 4 screws to ensure there was no breach.  I then used an osteotome to resect a large arthritic inferior L4 facet complex.  I then performed a generous laminotomy of L4 and exposed the underlying ligamentum flavum.  There was significant compression due to the thickened ligamentum flavum and actually was quite adherent to the thecal sac.  Using a [facility], I gently dissected the ligamentum flavum off of the thecal sac.  Using a 2 and 3 mm Kerrison rongeurs, I performed my laminotomy of L4 and continued to resect into the lateral gutter.  Once the entire inferior L4 facet complex was removed, I could then undercut the overhanging osteophytes from the superior L5 facet until  I could visualize the L5 pedicle.  I then performed a L5 foraminotomy and made sure I had complete decompression of the L5 nerve root.  I then palpated around the inferior aspect of the L5 pedicle and there was no breach.  I then continued superiorly and removed the entire L4 pars and identified the L4 nerve root in the foramen.  I then palpated the pedicle both medial and inferiorly and there was no breach.  At this point, I could now visualize the entire posterolateral corner of the disk space.  I then placed neural patty to help protect the nerve root and a neural retractor to protect the thecal sac.  I incised the annulus with a 15 blade scalpel and then using a combination of pituitary rongeurs, curettes, and Kerrison rongeurs, I removed all the disk material.  I rasped the endplates until I had bleeding subchondral bone.  Once this was done, I then measured the intervertebral space and elected to use a Titan size 10 extra long cage.  This was packed with the bone graft that I harvested from the laminotomy and mixed it with DBX mixture.  With the neural elements protected, I malleted the cage to the appropriate resting position.  I then repositioned this, so it was horizontally positioned along the anterior anulus.  It should be noted I did place bone graft along the anterior annulus and packed it down before inserting the cage.  At this point, I was pleased with the overall positioning of the cage.  I then removed the kyphosis from the Wilson frame and then disconnected the pedicle screws from the retracting blades.  I irrigated copiously with normal saline and made sure I had hemostasis.  I then applied the caps polyaxial heads to the pedicle screws, measured and placed the appropriate size rod and locked it into position.  The locking caps were then torqued off according to manufacture's standards.  I then went back to the right hand side, placed a bone graft and decorticated the  facet complex, placed the remaining bone graft in the posterolateral gutter, and then placed a rod percutaneously and then secured it.  I then placed a locking cap and torqued it appropriate.  At this point, I had excellent fixation.  Final x-rays were satisfactory.  The hardware and graft were in good position. I irrigated all 3 wounds, closed the deep fascia on the percutaneous side with a 0 Vicryl suture, and a 2-0 Vicryl sutures, and a 3-0 Monocryl, and then placed thrombin-soaked Gelfoam patty over the exposed thecal sac, closed the deep fascia with interrupted #1 Vicryl sutures, superficial with 2-0 Vicryl sutures, and then a 3-0 Monocryl for the skin.  Steri-Strips and a dry dressing were applied.  The patient was ultimately extubated and transferred to  the PACU without incident.  At the end of the case, all needle and sponge counts were correct.  There was no adverse intraoperative events.  First assistant was Benjiman Core, my PA who was instrumental in assisting with retraction, visualization, wound closure and set up.     Dahlia Bailiff, MD     DDB/MEDQ  D:  02/12/2014  T:  02/12/2014  Job:  [id number]

## 2014-02-12 NOTE — H&P (Signed)
H+P updated today See addendum to PA note

## 2014-02-12 NOTE — Transfer of Care (Signed)
Immediate Anesthesia Transfer of Care Note  Patient: [name]  Procedure(s) Performed: Procedure(s): POSTERIOR LUMBAR FUSION 1 LEVEL ( TLIF L4-L5 ) (N/A)  Patient Location: PACU  Anesthesia Type:General  Level of Consciousness: awake, alert  and oriented  Airway & Oxygen Therapy: Patient Spontanous Breathing and Patient connected to face mask oxygen  Post-op Assessment: Report given to PACU RN  Post vital signs: Reviewed and stable  Complications: No apparent anesthesia complications

## 2014-02-12 NOTE — Progress Notes (Signed)
Utilization review completed.  

## 2014-02-12 NOTE — Brief Op Note (Signed)
02/12/2014  12:06 PM  PATIENT:  [name]  58 y.o. 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   PROCEDURE:  Procedure(s): POSTERIOR LUMBAR FUSION 1 LEVEL ( TLIF L4-L5 ) (N/A)  SURGEON:  Surgeon(s) and Role:    * Melina Schools, MD - Primary  PHYSICIAN ASSISTANT:   ASSISTANTS: Benjiman Core   ANESTHESIA:   general  EBL:  Total I/O In: 2400 [I.V.:2400] Out: 500 [Urine:250; Blood:250]  BLOOD ADMINISTERED:none  DRAINS: none   LOCAL MEDICATIONS USED:  MARCAINE     SPECIMEN:  No Specimen  DISPOSITION OF SPECIMEN:  N/A  COUNTS:  YES  TOURNIQUET:  * No tourniquets in log *  DICTATION: .Other Dictation: Dictation Number [id number]  PLAN OF CARE: Admit to inpatient   PATIENT DISPOSITION:  PACU - hemodynamically stable.

## 2014-02-12 NOTE — Anesthesia Preprocedure Evaluation (Addendum)
Anesthesia Evaluation  Patient identified by MRN, date of birth, ID band Patient awake    Reviewed: Allergy & Precautions, H&P , NPO status , Patient's Chart, lab work & pertinent test results, reviewed documented beta blocker date and time   History of Anesthesia Complications Negative for: history of anesthetic complications  Airway Mallampati: III TM Distance: >3 FB Neck ROM: Full    Dental  (+) Partial Upper, Partial Lower,    Pulmonary neg pulmonary ROS,  breath sounds clear to auscultation        Cardiovascular hypertension, Pt. on medications - angina- Past MI and - CHF - dysrhythmias Rhythm:Regular     Neuro/Psych PSYCHIATRIC DISORDERS Anxiety Depression Bipolar Disorder Back pain  Neuromuscular disease    GI/Hepatic Neg liver ROS, hiatal hernia, PUD, GERD-  Medicated and Controlled,  Endo/Other  negative endocrine ROS  Renal/GU negative Renal ROS     Musculoskeletal  (+) Arthritis -,   Abdominal   Peds  Hematology negative hematology ROS (+)   Anesthesia Other Findings   Reproductive/Obstetrics                          Anesthesia Physical Anesthesia Plan  ASA: III  Anesthesia Plan: General   Post-op Pain Management:    Induction: Intravenous  Airway Management Planned: Oral ETT  Additional Equipment: None  Intra-op Plan:   Post-operative Plan: Extubation in OR  Informed Consent: I have reviewed the patients History and Physical, chart, labs and discussed the procedure including the risks, benefits and alternatives for the proposed anesthesia with the patient or authorized representative who has indicated his/her understanding and acceptance.   Dental advisory given  Plan Discussed with: CRNA, Anesthesiologist and Surgeon  Anesthesia Plan Comments:        Anesthesia Quick Evaluation

## 2014-02-13 ENCOUNTER — Encounter (HOSPITAL_COMMUNITY): Payer: Self-pay | Admitting: General Practice

## 2014-02-13 MED ORDER — OXYCODONE-ACETAMINOPHEN 10-325 MG PO TABS: 1.0000 | ORAL_TABLET | Freq: Four times a day (QID) | ORAL | Status: DC | PRN

## 2014-02-13 MED ORDER — POLYETHYLENE GLYCOL 3350 17 G PO PACK: 17.0000 g | PACK | Freq: Every day | ORAL | Status: DC

## 2014-02-13 MED ORDER — METHOCARBAMOL 500 MG PO TABS: 500.0000 mg | ORAL_TABLET | Freq: Four times a day (QID) | ORAL | Status: DC | PRN

## 2014-02-13 MED ORDER — DOCUSATE SODIUM 100 MG PO CAPS: 100.0000 mg | ORAL_CAPSULE | Freq: Two times a day (BID) | ORAL | Status: DC

## 2014-02-13 NOTE — Progress Notes (Signed)
Occupational Therapy Evaluation Patient Details Name: [name] MRN: [id number] DOB: Aug 30, 1955 Today's Date: 02/13/2014    History of Present Illness 58 y.o. female admitted to [facility] on 02/12/14 for elective L4/5 decompression, discectomy, and fusion.  Pt with significant PMHx of HTN, anxiety/depression, bipolar disorder, L partial knee arthroplasty, and bil carpal tunnel release.     Clinical Impression   PTA pt lived at home with her son and was independent with use of RW (due to leg pain). Pt is progressing extremely well and feel would be safe for d/c today from OT standpoint. Pt overall at Supervision level for ADLs and functional mobility. However, pt will continue to benefit from acute OT to increase independence with donning/doffing back brace and incorporating back precautions into ADLs.     Follow Up Recommendations  No OT follow up;Other (comment);Supervision - Intermittent (OOB/mobility)    Equipment Recommendations  None recommended by OT    Recommendations for Other Services       Precautions / Restrictions Precautions Precautions: Back Precaution Booklet Issued: Yes (comment) Precaution Comments: Educated pt on 3/3 back precautions and incorporating into ADLs.  Required Braces or Orthoses: Spinal Brace Spinal Brace: Lumbar corset;Applied in sitting position (no orders, but brace in room)      Mobility Bed Mobility Overal bed mobility: Needs Assistance Bed Mobility: Rolling;Sidelying to Sit Rolling: Supervision Sidelying to sit: Supervision       General bed mobility comments: HOB flat, use of bedrails. VC's for sequencing. No physical assist required.   Transfers Overall transfer level: Needs assistance Equipment used: Rolling walker (2 wheeled) Transfers: Sit to/from Stand Sit to Stand: Supervision         General transfer comment: Supervision for safety. Good hand placement. Pt still positioning L LE forward during transfers from Lt TKA.      Balance Overall balance assessment: Needs assistance Sitting-balance support: No upper extremity supported;Feet supported Sitting balance-Leahy Scale: Good     Standing balance support: During functional activity;No upper extremity supported Standing balance-Leahy Scale: Fair Standing balance comment: Pt able to remove Bil UEs from RW to perform grooming activities safely at sink. Continues to require UE support during dynamic standing                            ADL Overall ADL's : Needs assistance/impaired Eating/Feeding: Independent;Sitting   Grooming: Supervision/safety;Standing   Upper Body Bathing: Set up;Sitting   Lower Body Bathing: Supervison/ safety;Sit to/from stand   Upper Body Dressing : Min guard;Sitting;Cueing for sequencing Upper Body Dressing Details (indicate cue type and reason): pt required min guard and VC's to don brace Lower Body Dressing: Supervision/safety;Sit to/from stand Lower Body Dressing Details (indicate cue type and reason): pt demonstrated ability to reach Bil LEs to don socks in sitting while maintaining back precautions Toilet Transfer: Supervision/safety;RW;Ambulation;Grab bars Toilet Transfer Details (indicate cue type and reason): pt has counter beside her toilet to help with sit<>stand. Pt also has BSC if needed.  Toileting- Clothing Manipulation and Hygiene: Supervision/safety;Sit to/from stand Toileting - Clothing Manipulation Details (indicate cue type and reason): Pt able to safely manage toilet hygiene while maintaining back precautions.      Functional mobility during ADLs: Supervision/safety;Rolling walker General ADL Comments: Pt is progressing extremely well and reports improved pain control with mobility. Pt eager to work with therapy today. Educated pt on incorporating back precautions into ADLs through environmental modifications and activity adaptations. Educated pt on energy conservation.  Vision  Pt reports no  change from baseline. No apparent visual deficits.                    Perception Perception Perception Tested?: No   Praxis Praxis Praxis tested?: Within functional limits    Pertinent Vitals/Pain Pain Assessment: No/denies pain     Hand Dominance Right   Extremity/Trunk Assessment Upper Extremity Assessment Upper Extremity Assessment: Overall WFL for tasks assessed   Lower Extremity Assessment Lower Extremity Assessment: Overall WFL for tasks assessed (pt reports she needs a R TKA)   Cervical / Trunk Assessment Cervical / Trunk Assessment: Normal   Communication Communication Communication: No difficulties   Cognition Arousal/Alertness: Awake/alert Behavior During Therapy: WFL for tasks assessed/performed Overall Cognitive Status: Within Functional Limits for tasks assessed                                Home Living Family/patient expects to be discharged to:: Private residence Living Arrangements: Children (son works 3rd shift) Available Help at Discharge: Family;Available 24 hours/day Type of Home: Mobile home Home Access: Stairs to enter Entrance Stairs-Number of Steps: 2 separated steps  Entrance Stairs-Rails: Right;Left Home Layout: One level     Bathroom Shower/Tub: Tub/shower unit Shower/tub characteristics: Curtain Biochemist, clinical: Standard     Home Equipment: Environmental consultant - 2 wheels;Bedside commode;Tub bench   Additional Comments: DME from TKA      Prior Functioning/Environment Level of Independence: Independent with assistive device(s)        Comments: had started to use the RW again    OT Diagnosis: Generalized weakness;Acute pain   OT Problem List: Decreased strength;Decreased range of motion;Decreased activity tolerance;Decreased knowledge of precautions;Pain   OT Treatment/Interventions: Self-care/ADL training;Therapeutic exercise;Energy conservation;DME and/or AE instruction;Therapeutic activities;Patient/family  education;Balance training    OT Goals(Current goals can be found in the care plan section) Acute Rehab OT Goals Patient Stated Goal: to go home and back to routine OT Goal Formulation: With patient Time For Goal Achievement: 02/27/14 Potential to Achieve Goals: Good ADL Goals Pt Will Perform Grooming: with modified independence;standing Pt Will Perform Lower Body Dressing: with modified independence;sit to/from stand Pt Will Transfer to Toilet: with modified independence;ambulating;regular height toilet Pt Will Perform Toileting - Clothing Manipulation and hygiene: with modified independence;sit to/from stand Pt Will Perform Tub/Shower Transfer: Tub transfer;with modified independence;ambulating;tub bench;rolling walker Additional ADL Goal #1: Pt will perform bed mobility with Mod Independence using log roll technique to prepare for ADLs.   OT Frequency: Min 1X/week    End of Session Equipment Utilized During Treatment: Gait belt;Rolling walker;Back brace  Activity Tolerance: Patient tolerated treatment well Patient left: in chair;with call bell/phone within reach   Time: 0841-0915 OT Time Calculation (min): 34 min Charges:  OT General Charges $OT Visit: 1 Procedure OT Evaluation $Initial OT Evaluation Tier I: 1 Procedure OT Treatments $Self Care/Home Management : 8-22 mins $Therapeutic Activity: 8-22 mins  Juluis Rainier 02/13/2014, 9:36 AM  Cyndie Chime, OTR/L Occupational Therapist [phone] (pager)

## 2014-02-13 NOTE — Care Management Note (Signed)
CARE MANAGEMENT NOTE 02/13/2014  Patient:  [name], [name]   Account Number:  [id number]  Date Initiated:  02/13/2014  Documentation initiated by:  Ricki Miller  Subjective/Objective Assessment:   58 yr old female admitted with Grade 1 and II slip with scialiosis L4-L5, patient under went L4-L5 Lumbar fusion.     Action/Plan:   patient has no home health or DME needs. Case manager signed off.   Anticipated DC Date:  02/13/2014   Anticipated DC Plan:  [place]  CM consult      PAC Choice  NA   Choice offered to / List presented to:     DME arranged  NA        HH arranged  NA      Status of service:  Completed, signed off Discharge Disposition:  HOME/SELF CARE  Per UR Regulation:  Reviewed for med. necessity/level of care/duration of stay

## 2014-02-13 NOTE — Progress Notes (Signed)
Physical Therapy Treatment Patient Details Name: [name] MRN: [id number] DOB: Jan 08, 1956 Today's Date: 02/13/2014    History of Present Illness 58 y.o. female admitted to [facility] on 02/12/14 for elective L4/5 decompression, discectomy, and fusion.  Pt with significant PMHx of HTN, anxiety/depression, bipolar disorder, L partial knee arthroplasty, and bil carpal tunnel release.      PT Comments    Patient making great progress with therapy. Able to maintain precautions and is highly motivated. Patient safe to D/C from a mobility standpoint based on progression towards goals set on PT eval.    Follow Up Recommendations  No PT follow up;Supervision - Intermittent     Equipment Recommendations  None recommended by PT    Recommendations for Other Services       Precautions / Restrictions Precautions Precautions: Back Precaution Booklet Issued: Yes (comment) Precaution Comments: Patient able to recall all precautions Required Braces or Orthoses: Spinal Brace Spinal Brace: Lumbar corset;Applied in sitting position    Mobility  Bed Mobility Overal bed mobility: Needs Assistance Bed Mobility: Rolling;Sidelying to Sit Rolling: Supervision Sidelying to sit: Supervision       General bed mobility comments: In recliner. OOB with OT. See OT note  Transfers Overall transfer level: Needs assistance Equipment used: Rolling walker (2 wheeled) Transfers: Sit to/from Stand Sit to Stand: Supervision         General transfer comment: Supervision for safety. Good hand placement.  Ambulation/Gait Ambulation/Gait assistance: Supervision Ambulation Distance (Feet): 600 Feet Assistive device: Rolling walker (2 wheeled) Gait Pattern/deviations: Step-through pattern;Decreased stride length Gait velocity: WFL   General Gait Details: verbal cues for upright posture and to stay closer to RW during gait.     Stairs Stairs: Yes Stairs assistance: Min guard Stair Management: Step to  pattern;Forwards Number of Stairs: 3 General stair comments: Cues for technique. Good safety awareness  Wheelchair Mobility    Modified Rankin (Stroke Patients Only)       Balance Overall balance assessment: Needs assistance Sitting-balance support: No upper extremity supported;Feet supported Sitting balance-Leahy Scale: Good     Standing balance support: During functional activity;No upper extremity supported Standing balance-Leahy Scale: Fair Standing balance comment: Pt able to remove Bil UEs from RW to perform grooming activities safely at sink. Continues to require UE support during dynamic standing                    Cognition Arousal/Alertness: Awake/alert Behavior During Therapy: WFL for tasks assessed/performed Overall Cognitive Status: Within Functional Limits for tasks assessed                      Exercises      General Comments        Pertinent Vitals/Pain Pain Assessment: No/denies pain Pain Score: 8  Pain Location: low back pain Pain Descriptors / Indicators: Sore Pain Intervention(s): Monitored during session;Limited activity within patient's tolerance    Home Living Family/patient expects to be discharged to:: Private residence Living Arrangements: Children (son works 3rd shift) Available Help at Discharge: Family;Available 24 hours/day Type of Home: Mobile home Home Access: Stairs to enter Entrance Stairs-Rails: Right;Left Home Layout: One level Home Equipment: Environmental consultant - 2 wheels;Bedside commode;Tub bench Additional Comments: DME from TKA    Prior Function Level of Independence: Independent with assistive device(s)      Comments: had started to use the RW again   PT Goals (current goals can now be found in the care plan section) Acute Rehab PT Goals  Patient Stated Goal: to go home and back to routine Progress towards PT goals: Progressing toward goals    Frequency  Min 5X/week    PT Plan Current plan remains  appropriate    Co-evaluation             End of Session Equipment Utilized During Treatment: Back brace Activity Tolerance: Patient tolerated treatment well Patient left: in chair;with call bell/phone within reach     Time: 0940-0951 PT Time Calculation (min): 11 min  Charges:  $Gait Training: 8-22 mins                    G Codes:      Jacqualyn Posey 02/13/2014, 10:11 AM 02/13/2014 Jacqualyn Posey PTA [phone] pager [phone] office

## 2014-02-13 NOTE — Anesthesia Postprocedure Evaluation (Signed)
  Anesthesia Post-op Note  Patient: [name]  Procedure(s) Performed: Procedure(s): POSTERIOR LUMBAR FUSION 1 LEVEL ( TLIF L4-L5 ) (N/A)  Patient Location: PACU  Anesthesia Type:General  Level of Consciousness: awake  Airway and Oxygen Therapy: Patient Spontanous Breathing  Post-op Pain: mild  Post-op Assessment: Post-op Vital signs reviewed, Patient's Cardiovascular Status Stable, Respiratory Function Stable, Patent Airway, No signs of Nausea or vomiting and Pain level controlled  Post-op Vital Signs: Reviewed and stable  Last Vitals:  Filed Vitals:   02/13/14 0538  BP: 98/58  Pulse:   Temp:   Resp:     Complications: No apparent anesthesia complications

## 2014-02-13 NOTE — Progress Notes (Signed)
Subjective: Doing well.  Pain controlled.    Objective: Vital signs in last 24 hours: Temp:  [97.4 F (36.3 C)-98.5 F (36.9 C)] 98.5 F (36.9 C) (10/08 0533) Pulse Rate:  [59-90] 86 (10/08 0533) Resp:  [11-18] 16 (10/08 0533) BP: (76-107)/(45-69) 98/58 mmHg (10/08 0538) SpO2:  [94 %-100 %] 98 % (10/08 0533)  Intake/Output from previous day: 10/07 0701 - 10/08 0700 In: 4100 [P.O.:240; I.V.:3760; IV Piggyback:100] Out: 2000 [Urine:1750; Blood:250] Intake/Output this shift:     Recent Labs  02/10/14 1231  HGB 14.0    Recent Labs  02/10/14 1231  WBC 5.6  RBC 4.51  HCT 40.8  PLT 278    Recent Labs  02/10/14 1231  NA 139  K 4.4  CL 101  CO2 24  BUN 20  CREATININE 1.02  GLUCOSE 85  CALCIUM 9.7    Recent Labs  02/10/14 1231  INR 1.00    Exam:  Alert and oriented.  Neurologically intact.    Assessment/Plan: Start PT.  Anticipate d/c home today or tomorrow.  Pt seen with Dr Rolena Infante.    Ji Feldner M 02/13/2014, 9:09 AM

## 2014-02-14 ENCOUNTER — Ambulatory Visit: Payer: Medicaid Other | Admitting: Gastroenterology

## 2014-02-20 NOTE — Discharge Summary (Signed)
Patient ID: [name] MRN: [id number] DOB/AGE: 1956-03-31 58 y.o.  Admit date: 02/12/2014 Discharge date: 02/20/2014  Admission Diagnoses:  Active Problems:   Back pain   Discharge Diagnoses:  Active Problems:   Back pain  status post Procedure(s): POSTERIOR LUMBAR FUSION 1 LEVEL ( TLIF L4-L5 )  Past Medical History  Diagnosis Date  . Hiatal hernia   . Palpitations   . Anxiety   . Hypertension   . Hypercholesterolemia   . Depression   . Chest pain     "STRESS"; had normal nuclear stress test 02/2011; denied recent history 02/10/14  . Arthritis   . GERD (gastroesophageal reflux disease)   . History of stomach ulcers   . Difficulty sleeping     mind racing  . Bipolar 1 disorder   . Back pain, chronic   . Heart murmur     childhood    Surgeries: Procedure(s): POSTERIOR LUMBAR FUSION 1 LEVEL ( TLIF L4-L5 ) on 02/12/2014   Consultants:    Discharged Condition: Improved  Hospital Course: [name] is an 58 y.o. female who was admitted 02/12/2014 for operative treatment of lumbar stenosis. Patient failed conservative treatments (please see the history and physical for the specifics) and had severe unremitting pain that affects sleep, daily activities and work/hobbies. After pre-op clearance, the patient was taken to the operating room on 02/12/2014 and underwent  Procedure(s): POSTERIOR LUMBAR FUSION 1 LEVEL ( TLIF L4-L5 ).    Patient was given perioperative antibiotics:  Anti-infectives   Start     Dose/Rate Route Frequency Ordered Stop   02/12/14 1930  ceFAZolin (ANCEF) IVPB 1 g/50 mL premix     1 g 100 mL/hr over 30 Minutes Intravenous Every 8 hours 02/12/14 1513 02/13/14 0400   02/11/14 1423  ceFAZolin (ANCEF) IVPB 2 g/50 mL premix     2 g 100 mL/hr over 30 Minutes Intravenous 30 min pre-op 02/11/14 1423 02/12/14 1147       Patient was given sequential compression devices and early ambulation to prevent DVT.   Patient benefited maximally from  hospital stay and there were no complications. At the time of discharge, the patient was urinating/moving their bowels without difficulty, tolerating a regular diet, pain is controlled with oral pain medications and they have been cleared by PT/OT.   Recent vital signs: No data found.    Recent laboratory studies: No results found for this basename: WBC, HGB, HCT, PLT, NA, K, CL, CO2, BUN, CREATININE, GLUCOSE, PT, INR, CALCIUM, 2,  in the last 72 hours   Discharge Medications:     Medication List    STOP taking these medications       acetaminophen 500 MG tablet  Commonly known as:  TYLENOL     HYDROcodone-acetaminophen 5-325 MG per tablet  Commonly known as:  NORCO/VICODIN      TAKE these medications       ALPRAZolam 1 MG tablet  Commonly known as:  XANAX  Take 2 mg by mouth 3 (three) times daily as needed for anxiety.     docusate sodium 100 MG capsule  Commonly known as:  COLACE  Take 1 capsule (100 mg total) by mouth 2 (two) times daily.     Linaclotide 290 MCG Caps capsule  Commonly known as:  LINZESS  Take 1 capsule (290 mcg total) by mouth daily. 30 minutes before breakfast     lisinopril 20 MG tablet  Commonly known as:  PRINIVIL,ZESTRIL  Take 20  mg by mouth every morning.     methocarbamol 500 MG tablet  Commonly known as:  ROBAXIN  Take 1 tablet (500 mg total) by mouth every 6 (six) hours as needed for muscle spasms.     omeprazole 20 MG capsule  Commonly known as:  PRILOSEC  Take 1 capsule (20 mg total) by mouth daily.     ondansetron 4 MG tablet  Commonly known as:  ZOFRAN  Take 1 tablet (4 mg total) by mouth every 8 (eight) hours as needed for nausea or vomiting.     oxyCODONE-acetaminophen 10-325 MG per tablet  Commonly known as:  PERCOCET  Take 1 tablet by mouth every 6 (six) hours as needed.     polyethylene glycol packet  Commonly known as:  MIRALAX / GLYCOLAX  Take 17 g by mouth daily.        Diagnostic Studies: Dg Chest 2  View  02/10/2014   CLINICAL DATA:  Preop examination for back surgery  EXAM: CHEST  2 VIEW  COMPARISON:  02/14/1939  FINDINGS: Cardiomediastinal silhouette is unremarkable. Mild lower thoracic dextroscoliosis. No acute infiltrate or pleural effusion. No pulmonary edema. Mild degenerative changes mid and lower thoracic spine.  IMPRESSION: No active cardiopulmonary disease.   Electronically Signed   By: Lahoma Crocker M.D.   On: 02/10/2014 13:49   Dg Lumbar Spine 2-3 Views  02/12/2014   CLINICAL DATA:  Posterior fusion of L4-5.  EXAM: DG C-ARM 61-120 MIN; LUMBAR SPINE - 2-3 VIEW  COMPARISON:  February 13, 2013.  FINDINGS: Two intraoperative fluoroscopic images of the lower lumbar spine were submitted for review. These demonstrate the patient the status post posterior fusion of L4-5 with interbody fusion. Good alignment of the vertebral bodies is noted.  IMPRESSION: Status post posterior fusion of L4-5.   Electronically Signed   By: Sabino Dick M.D.   On: 02/12/2014 12:49   Dg C-arm 61-120 Min  02/12/2014   CLINICAL DATA:  Posterior fusion of L4-5.  EXAM: DG C-ARM 61-120 MIN; LUMBAR SPINE - 2-3 VIEW  COMPARISON:  February 13, 2013.  FINDINGS: Two intraoperative fluoroscopic images of the lower lumbar spine were submitted for review. These demonstrate the patient the status post posterior fusion of L4-5 with interbody fusion. Good alignment of the vertebral bodies is noted.  IMPRESSION: Status post posterior fusion of L4-5.   Electronically Signed   By: Sabino Dick M.D.   On: 02/12/2014 12:49        Discharge Instructions   Call MD / Call 911    Complete by:  As directed   If you experience chest pain or shortness of breath, CALL 911 and be transported to the hospital emergency room.  If you develope a fever above 101 F, pus (white drainage) or increased drainage or redness at the wound, or calf pain, call your surgeon's office.     Constipation Prevention    Complete by:  As directed   Drink plenty of  fluids.  Prune juice may be helpful.  You may use a stool softener, such as Colace (over the counter) 100 mg twice a day.  Use MiraLax (over the counter) for constipation as needed.     Diet - low sodium heart healthy    Complete by:  As directed      Discharge instructions    Complete by:  As directed   Ok to shower 5 days postop.  Do not apply any creams or ointments to incision.  Do not  remove steri-strips.  Can use 4x4 gauze and tape for dressing changes.  No aggressive activity.  No bending, squatting or prolonged sitting.  Mostly be in reclined position or lying down.  Must wear brace when up and ambulating.     Driving restrictions    Complete by:  As directed   No driving until further notice.     Increase activity slowly as tolerated    Complete by:  As directed      Lifting restrictions    Complete by:  As directed   No lifting until further notice.           Follow-up Information   Schedule an appointment as soon as possible for a visit with Dahlia Bailiff, MD. (need return office visit 2 weeks postop)    Specialty:  Orthopedic Surgery   Contact information:   [address] [place] [address] [place] [address] [phone]       Discharge Plan:  discharge to home  Disposition:     Signed: Lanae Crumbly for Dr. Melina Schools [facility] [phone] 02/20/2014, 10:15 AM

## 2014-02-24 NOTE — Discharge Summary (Signed)
Agree with above 

## 2014-07-16 ENCOUNTER — Ambulatory Visit: Payer: Medicaid Other | Attending: Orthopedic Surgery | Admitting: Physical Therapy

## 2014-07-16 ENCOUNTER — Encounter: Payer: Self-pay | Admitting: Physical Therapy

## 2014-07-16 DIAGNOSIS — M545 Low back pain: Secondary | ICD-10-CM

## 2014-07-16 NOTE — Therapy (Signed)
[facility] [place], [place], [address] Phone: [phone]   Fax:  [phone]  Physical Therapy Evaluation  Patient Details  Name: [name] MRN: [id number] Date of Birth: 06/16/1955 Referring Provider:  Melina Schools, MD  Encounter Date: 07/16/2014      PT End of Session - 07/16/14 1204    Visit Number 1   Number of Visits 1   PT Start Time 1115   PT Stop Time 1145   PT Time Calculation (min) 30 min   Activity Tolerance Patient tolerated treatment well   Behavior During Therapy [facility] for tasks assessed/performed      Past Medical History  Diagnosis Date  . Hiatal hernia   . Palpitations   . Anxiety   . Hypertension   . Hypercholesterolemia   . Depression   . Chest pain     "STRESS"; had normal nuclear stress test 02/2011; denied recent history 02/10/14  . Arthritis   . GERD (gastroesophageal reflux disease)   . History of stomach ulcers   . Difficulty sleeping     mind racing  . Bipolar 1 disorder   . Back pain, chronic   . Heart murmur     childhood    Past Surgical History  Procedure Laterality Date  . Appendectomy    . Carpal tunnel release      BILATERAL  . Colonoscopy, esophagogastroduodenoscopy (egd) and esophageal dilation N/A 02/06/2013    RMR. Benign colon polyp, diverticulosis, hemorrhoids. EGD with Schatzki's ring s/p 38 F dilation, 80mm elliptical prepyloric antral ulcer with adjacent scarring, negative path  . Partial knee arthroplasty Left 07/15/2013    Procedure: UNICOMPARTMENTAL LEFT KNEE MEDIAL ;  Surgeon: [name], MD;  Location: [facility];  Service: Orthopedics;  Laterality: Left;  . Esophagogastroduodenoscopy N/A 09/23/2013    Dr. Gala Romney: non-critical Schatzki's ring, not manipulated, healed ulcer, gastric bx negative  . Lumbar fusion  02/12/2014    LEVEL 1 L4 L5    DR BROOKS    There were no vitals taken for this visit.  Visit Diagnosis:  Right low back pain, with sciatica  presence unspecified - Plan: PT plan of care cert/re-cert      Subjective Assessment - 07/16/14 1153    Symptoms I walk to walk more but my knees hurt   Limitations Walking   How long can you walk comfortably? 15 minutes.   Patient Stated Goals Get out of pain so I can do home activties easier.   Currently in Pain? Yes   Pain Score 8    Pain Location Back   Pain Orientation Right;Left;Mid   Pain Descriptors / Indicators Aching;Constant   Pain Type Chronic pain   Pain Radiating Towards Bilateral LE's.   Pain Onset More than a month ago   Pain Frequency Constant   Aggravating Factors  Walking.   Pain Relieving Factors Rest.   Effect of Pain on Daily Activities Cannot perform ADL's like I would like to.          [facility] PT Assessment - 07/16/14 0001    Assessment   Medical Diagnosis Lumbar fusion   Onset Date --  02/2014   Precautions   Precautions --  Lumbar fusion.   Balance Screen   Has the patient fallen in the past 6 months No   Has the patient had a decrease in activity level because of a fear of falling?  Yes   Is the patient reluctant to leave their home  because of a fear of falling?  No   ROM / Strength   AROM / PROM / Strength Strength  Normal bilateral hip flexion assessed in supine.   Strength   Overall Strength Comments Normal bilateral knee and hip strength.   Palpation   Palpation --  Taut to palpation over bilateral LB paraspinal musculature.   Special Tests    Special Tests --  Equal leg lengths.  Absent LE DTR's.   Ambulation/Gait   Ambulation/Gait --  Slow and purposeful.                          PT Education - 07/16/14 1203    Education provided Yes   Person(s) Educated Patient   Methods Explanation;Demonstration   Comprehension Verbalized understanding;Returned demonstration             PT Long Term Goals - 07/16/14 1208    PT LONG TERM GOAL #1   Title Evaluation only.   Time 1   Period Days   Status New                Plan - 07/16/14 1205    Clinical Impression Statement The patient underwent a lumbar fusion surgery in October of 2015.  She is better overall but after walking for a bit she experiences pain not bilateral LE's with a pain rating of 7-8/10.  She has a h/o bilateral knee pain.  She has been told she can ride her horse at a walking pace.  Patient's insurance provider only allows her one vistit.   PT Frequency --  Evaluation only   PT Next Visit Plan N/A.   Consulted and Agree with Plan of Care Patient         Problem List Patient Active Problem List   Diagnosis Date Noted  . Back pain 02/12/2014  . Nausea alone 12/05/2013  . PUD (peptic ulcer disease) 09/05/2013  . Unspecified constipation 09/05/2013  . Expected blood loss anemia 07/16/2013  . Overweight (BMI 25.0-29.9) 07/16/2013  . 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 Abdominal pain, unspecified site 02/13/2013  . Rectal bleeding 02/05/2013  . Abdominal  pain, other specified site 02/05/2013    Haley Fuerstenberg, [place] MPT 07/16/2014, 12:23 PM  [facility] [address] [place], [place], [address] Phone: [phone]   Fax:  [phone]

## 2014-07-16 NOTE — Patient Instructions (Signed)
TENS UNIT Single knee to chest Mini-crunches Draw-in Hip bridges

## 2014-08-14 ENCOUNTER — Other Ambulatory Visit (HOSPITAL_COMMUNITY): Payer: Self-pay | Admitting: *Deleted

## 2014-08-14 DIAGNOSIS — Z1231 Encounter for screening mammogram for malignant neoplasm of breast: Secondary | ICD-10-CM

## 2014-09-17 ENCOUNTER — Ambulatory Visit (HOSPITAL_COMMUNITY): Payer: Medicaid Other

## 2014-11-17 ENCOUNTER — Ambulatory Visit: Payer: Medicaid Other | Attending: Orthopedic Surgery | Admitting: Physical Therapy

## 2014-11-17 DIAGNOSIS — M25561 Pain in right knee: Secondary | ICD-10-CM | POA: Diagnosis not present

## 2014-11-17 DIAGNOSIS — M25661 Stiffness of right knee, not elsewhere classified: Secondary | ICD-10-CM | POA: Diagnosis not present

## 2014-11-17 NOTE — Therapy (Signed)
[facility] [place], [place], [address] Phone: [phone]   Fax:  [phone]  Physical Therapy Evaluation  Patient Details  Name: [name] MRN: [id number] Date of Birth: 01-27-1956 Referring Provider:  Paralee Cancel, MD  Encounter Date: 11/17/2014      PT End of Session - 11/17/14 1136    Visit Number 1   Number of Visits 12   Date for PT Re-Evaluation 12/29/14   PT Start Time 5329   PT Stop Time 1117   PT Time Calculation (min) 37 min   Activity Tolerance Patient tolerated treatment well   Behavior During Therapy [facility] for tasks assessed/performed      Past Medical History  Diagnosis Date  . Hiatal hernia   . Palpitations   . Anxiety   . Hypertension   . Hypercholesterolemia   . Depression   . Chest pain     "STRESS"; had normal nuclear stress test 02/2011; denied recent history 02/10/14  . Arthritis   . GERD (gastroesophageal reflux disease)   . History of stomach ulcers   . Difficulty sleeping     mind racing  . Bipolar 1 disorder   . Back pain, chronic   . Heart murmur     childhood    Past Surgical History  Procedure Laterality Date  . Appendectomy    . Carpal tunnel release      BILATERAL  . Colonoscopy, esophagogastroduodenoscopy (egd) and esophageal dilation N/A 02/06/2013    RMR. Benign colon polyp, diverticulosis, hemorrhoids. EGD with Schatzki's ring s/p 62 F dilation, 77mm elliptical prepyloric antral ulcer with adjacent scarring, negative path  . Partial knee arthroplasty Left 07/15/2013    Procedure: UNICOMPARTMENTAL LEFT KNEE MEDIAL ;  Surgeon: [name], MD;  Location: [facility];  Service: Orthopedics;  Laterality: Left;  . Esophagogastroduodenoscopy N/A 09/23/2013    Dr. Gala Romney: non-critical Schatzki's ring, not manipulated, healed ulcer, gastric bx negative  . Lumbar fusion  02/12/2014    LEVEL 1 L4 L5    DR BROOKS    There were no vitals filed for this visit.  Visit Diagnosis:   Right knee pain - Plan: PT plan of care cert/re-cert  Knee stiffness, right - Plan: PT plan of care cert/re-cert      Subjective Assessment - 11/17/14 1121    Subjective I'm still in a lot of pain.   Limitations Walking   Patient Stated Goals Get my knee back to normal.   Pain Score 8    Pain Location Knee   Pain Descriptors / Indicators Aching;Constant   Pain Type Surgical pain   Pain Onset In the past 7 days   Pain Frequency Constant   Aggravating Factors  Walking.   Pain Relieving Factors Rest.            [facility] PT Assessment - 11/17/14 0001    Assessment   Medical Diagnosis Right total knee replacement.   Onset Date/Surgical Date --  11/13/14.   Precautions   Precautions --  No ultrasound.   Restrictions   Weight Bearing Restrictions No   Balance Screen   Has the patient fallen in the past 6 months Yes   How many times? 4   Has the patient had a decrease in activity level because of a fear of falling?  Yes   Is the patient reluctant to leave their home because of a fear of falling?  Yes   [address] residence  Prior Function   Level of Independence Independent   Cognition   Overall Cognitive Status Within Functional Limits for tasks assessed   Observation/Other Assessments-Edema    Edema Circumferential   Circumferential Edema   Circumferential - Right Right 2 cms > than left at mid-patellar position.   ROM / Strength   AROM / PROM / Strength AROM;PROM;Strength   AROM   Overall AROM Comments -8 to 90 degrees.   PROM   Overall PROM Comments 0 to 95 degrees.   Strength   Overall Strength Comments Right hip strength= 3+/5.  The patient exhibits a decrease in volitional contraction of her right quadriceps.   Palpation   Patella mobility --  Normal.   Palpation comment Diffuse anterior right knee pain.   Ambulation/Gait   Gait Comments The patient exhibits an antalgic gait pattern using a FWW.                            PT Education - 11/17/14 1135    Education provided Yes   Person(s) Educated Patient   Methods Explanation   Comprehension Verbalized understanding             PT Long Term Goals - 11/17/14 1145    PT LONG TERM GOAL #1   Title Ind with advanced HEP.   Baseline No knowledge of advanced ther ex.   Time 6   Period Weeks   Status New   PT LONG TERM GOAL #2   Title Full active right knee extension.   Baseline -8 degrees.   Time 6   Period Weeks   Status New   PT LONG TERM GOAL #3   Title Active right knee flexion to 115 degrees.   Baseline 90 degrees.   Time 6   Period Weeks   Status New   PT LONG TERM GOAL #4   Title Increase right knee strength to 5/5.   Baseline Decreased volitional contraction of right quadriceps.   Time 6   Period Weeks   Status New   PT LONG TERM GOAL #5   Title Perform ADL's with pain not > 3/10.   Baseline Pain currently rated at 7-8/10.   Time 6   Period Weeks   Status New               Plan - 11/17/14 1137    Clinical Impression Statement The patient underwent a right total knee replacement on 11/13/14.  She rates her ppain at a 7-8/10 today.  She is using a FWW at this time and is complant to use of TEDS hose.   Pt will benefit from skilled therapeutic intervention in order to improve on the following deficits Pain;Decreased activity tolerance;Decreased range of motion;Decreased strength;Increased edema   Rehab Potential Good   PT Frequency 3x / week   PT Duration 4 weeks   PT Treatment/Interventions ADLs/Self Care Home Management;Cryotherapy;Occupational psychologist;Therapeutic activities;Therapeutic exercise;Neuromuscular re-education;Patient/family education;Passive range of motion;Vasopneumatic Device   PT Next Visit Plan Right total knee protocol.         Problem List Patient Active Problem List   Diagnosis Date Noted  . Back pain 02/12/2014  .  Nausea alone 12/05/2013  . PUD (peptic ulcer disease) 09/05/2013  . Unspecified constipation 09/05/2013  . Expected blood loss anemia 07/16/2013  . Overweight (BMI 25.0-29.9) 07/16/2013  . 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 Abdominal pain, unspecified site 02/13/2013  . Rectal bleeding 02/05/2013  .  Abdominal pain, other specified site 02/05/2013    [name], [place] MPT 11/17/2014, 12:05 PM  [facility] [address] [place], [place], [address] Phone: [phone]   Fax:  [phone]

## 2014-11-17 NOTE — Patient Instructions (Signed)
Reviewed HEP.   

## 2014-11-17 NOTE — Therapy (Signed)
[facility] [place], [place], [address] Phone: [phone]   Fax:  [phone]  Physical Therapy Evaluation  Patient Details  Name: [name] MRN: [id number] Date of Birth: 1956/04/29 Referring Provider:  Paralee Cancel, MD  Encounter Date: 11/17/2014      PT End of Session - 11/17/14 1136    Visit Number 1   Number of Visits 12   Date for PT Re-Evaluation 12/29/14   PT Start Time 3818   PT Stop Time 1117   PT Time Calculation (min) 37 min   Activity Tolerance Patient tolerated treatment well   Behavior During Therapy [facility] for tasks assessed/performed      Past Medical History  Diagnosis Date  . Hiatal hernia   . Palpitations   . Anxiety   . Hypertension   . Hypercholesterolemia   . Depression   . Chest pain     "STRESS"; had normal nuclear stress test 02/2011; denied recent history 02/10/14  . Arthritis   . GERD (gastroesophageal reflux disease)   . History of stomach ulcers   . Difficulty sleeping     mind racing  . Bipolar 1 disorder   . Back pain, chronic   . Heart murmur     childhood    Past Surgical History  Procedure Laterality Date  . Appendectomy    . Carpal tunnel release      BILATERAL  . Colonoscopy, esophagogastroduodenoscopy (egd) and esophageal dilation N/A 02/06/2013    RMR. Benign colon polyp, diverticulosis, hemorrhoids. EGD with Schatzki's ring s/p 54 F dilation, 25mm elliptical prepyloric antral ulcer with adjacent scarring, negative path  . Partial knee arthroplasty Left 07/15/2013    Procedure: UNICOMPARTMENTAL LEFT KNEE MEDIAL ;  Surgeon: [name], MD;  Location: [facility];  Service: Orthopedics;  Laterality: Left;  . Esophagogastroduodenoscopy N/A 09/23/2013    Dr. Gala Romney: non-critical Schatzki's ring, not manipulated, healed ulcer, gastric bx negative  . Lumbar fusion  02/12/2014    LEVEL 1 L4 L5    DR BROOKS    There were no vitals filed for this visit.  Visit Diagnosis:   Right knee pain - Plan: PT plan of care cert/re-cert  Knee stiffness, right - Plan: PT plan of care cert/re-cert      Subjective Assessment - 11/17/14 1121    Subjective I'm still in a lot of pain.   Limitations Walking   Patient Stated Goals Get my knee back to normal.   Pain Score 8    Pain Location Knee   Pain Descriptors / Indicators Aching;Constant   Pain Type Surgical pain   Pain Onset In the past 7 days   Pain Frequency Constant   Aggravating Factors  Walking.   Pain Relieving Factors Rest.            [facility] PT Assessment - 11/17/14 0001    Assessment   Medical Diagnosis Right unicompartmental knee replacement.   Onset Date/Surgical Date --  11/13/14.   Precautions   Precautions --  No ultrasound.   Restrictions   Weight Bearing Restrictions No   Balance Screen   Has the patient fallen in the past 6 months Yes   How many times? 4   Has the patient had a decrease in activity level because of a fear of falling?  Yes   Is the patient reluctant to leave their home because of a fear of falling?  Yes   [place] residence  Prior Function   Level of Independence Independent   Cognition   Overall Cognitive Status Within Functional Limits for tasks assessed   Observation/Other Assessments-Edema    Edema Circumferential   Circumferential Edema   Circumferential - Right Right 2 cms > than left at mid-patellar position.   ROM / Strength   AROM / PROM / Strength AROM;PROM;Strength   AROM   Overall AROM Comments -8 to 90 degrees.   PROM   Overall PROM Comments 0 to 95 degrees.   Strength   Overall Strength Comments Right hip strength= 3+/5.  The patient exhibits a decrease in volitional contraction of her right quadriceps.   Palpation   Patella mobility --  Normal.   Palpation comment Diffuse anterior right knee pain.   Ambulation/Gait   Gait Comments The patient exhibits an antalgic gait pattern using a FWW.                            PT Education - 11/17/14 1135    Education provided Yes   Person(s) Educated Patient   Methods Explanation   Comprehension Verbalized understanding             PT Long Term Goals - 11/17/14 1145    PT LONG TERM GOAL #1   Title Ind with advanced HEP.   Baseline No knowledge of advanced ther ex.   Time 6   Period Weeks   Status New   PT LONG TERM GOAL #2   Title Full active right knee extension.   Baseline -8 degrees.   Time 6   Period Weeks   Status New   PT LONG TERM GOAL #3   Title Active right knee flexion to 115 degrees.   Baseline 90 degrees.   Time 6   Period Weeks   Status New   PT LONG TERM GOAL #4   Title Increase right knee strength to 5/5.   Baseline Decreased volitional contraction of right quadriceps.   Time 6   Period Weeks   Status New   PT LONG TERM GOAL #5   Title Perform ADL's with pain not > 3/10.   Baseline Pain currently rated at 7-8/10.   Time 6   Period Weeks   Status New               Plan - 11/17/14 1352    Clinical Impression Statement The patient underwent a unicompartmental knee replacement on 11/13/14. She rates her pain at a 7-8/10 today. She is usig a FWW at this time and is complant to use of TEDS hose   PT Next Visit Plan Right unicompartmental knee protocol         Problem List Patient Active Problem List   Diagnosis Date Noted  . Back pain 02/12/2014  . Nausea alone 12/05/2013  . PUD (peptic ulcer disease) 09/05/2013  . Unspecified constipation 09/05/2013  . Expected blood loss anemia 07/16/2013  . Overweight (BMI 25.0-29.9) 07/16/2013  . 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 Abdominal pain, unspecified site 02/13/2013  . Rectal bleeding 02/05/2013  . Abdominal pain, other specified site 02/05/2013    Moris Ratchford, [place] 11/17/2014, 1:57 PM  [facility] [address] [place], [place], [address] Phone: [phone]   Fax:   [phone]

## 2014-11-24 ENCOUNTER — Encounter: Payer: Self-pay | Admitting: Physical Therapy

## 2014-11-24 ENCOUNTER — Ambulatory Visit: Payer: Medicaid Other | Admitting: Physical Therapy

## 2014-11-24 DIAGNOSIS — M25561 Pain in right knee: Secondary | ICD-10-CM | POA: Diagnosis not present

## 2014-11-24 DIAGNOSIS — M545 Low back pain: Secondary | ICD-10-CM

## 2014-11-24 DIAGNOSIS — M25661 Stiffness of right knee, not elsewhere classified: Secondary | ICD-10-CM

## 2014-11-24 NOTE — Therapy (Signed)
[facility] [place], [place], [address] Phone: [phone]   Fax:  [phone]  Physical Therapy Treatment  Patient Details  Name: [name] MRN: [id number] Date of Birth: 1955-10-21 Referring Provider:  Raiford Simmonds., PA-C  Encounter Date: 11/24/2014      PT End of Session - 11/24/14 1123    Visit Number 2   Number of Visits 12   Date for PT Re-Evaluation 12/29/14   PT Start Time 1119   PT Stop Time 1213   PT Time Calculation (min) 54 min   Equipment Utilized During Treatment Other (comment)  SBQC   Activity Tolerance Patient tolerated treatment well   Behavior During Therapy [facility] for tasks assessed/performed      Past Medical History  Diagnosis Date  . Hiatal hernia   . Palpitations   . Anxiety   . Hypertension   . Hypercholesterolemia   . Depression   . Chest pain     "STRESS"; had normal nuclear stress test 02/2011; denied recent history 02/10/14  . Arthritis   . GERD (gastroesophageal reflux disease)   . History of stomach ulcers   . Difficulty sleeping     mind racing  . Bipolar 1 disorder   . Back pain, chronic   . Heart murmur     childhood    Past Surgical History  Procedure Laterality Date  . Appendectomy    . Carpal tunnel release      BILATERAL  . Colonoscopy, esophagogastroduodenoscopy (egd) and esophageal dilation N/A 02/06/2013    RMR. Benign colon polyp, diverticulosis, hemorrhoids. EGD with Schatzki's ring s/p 41 F dilation, 6mm elliptical prepyloric antral ulcer with adjacent scarring, negative path  . Partial knee arthroplasty Left 07/15/2013    Procedure: UNICOMPARTMENTAL LEFT KNEE MEDIAL ;  Surgeon: [name], MD;  Location: [facility];  Service: Orthopedics;  Laterality: Left;  . Esophagogastroduodenoscopy N/A 09/23/2013    Dr. Gala Romney: non-critical Schatzki's ring, not manipulated, healed ulcer, gastric bx negative  . Lumbar fusion  02/12/2014    LEVEL 1 L4 L5    DR BROOKS     There were no vitals filed for this visit.  Visit Diagnosis:  Right knee pain  Knee stiffness, right  Right low back pain, with sciatica presence unspecified      Subjective Assessment - 11/24/14 1121    Subjective Continues to report soreness.   Limitations Walking   Patient Stated Goals Get my knee back to normal.   Currently in Pain? Yes   Pain Score 8    Pain Location Knee   Pain Orientation Right   Pain Descriptors / Indicators Sore   Pain Type Surgical pain   Pain Onset In the past 7 days            [facility] PT Assessment - 11/24/14 0001    Assessment   Medical Diagnosis Right unicompartmental knee replacement.   Onset Date/Surgical Date 11/13/14   Next MD Visit 12/2014   ROM / Strength   AROM / PROM / Strength AROM   AROM   Overall AROM  Deficits   AROM Assessment Site Knee   Right/Left Knee Right   Right Knee Extension 0   Right Knee Flexion 115                     [facility] Adult PT Treatment/Exercise - 11/24/14 0001    Exercises   Exercises Knee/Hip   Knee/Hip Exercises: Stretches  Active Hamstring Stretch Right;3 reps;30 seconds   Knee/Hip Exercises: Aerobic   Nustep L3 x8 min   Knee/Hip Exercises: Standing   Knee Flexion AROM;Right;2 sets;10 reps  on 8" step   Rocker Board 3 minutes  for gastroc stretch   Knee/Hip Exercises: Seated   Long Arc Quad Strengthening;Right;2 sets;10 reps;Weights   Long Arc Quad Weight 2 lbs.   Modalities   Modalities Science writer Action IFC   Electrical Stimulation Parameters 1-10 Hz x15 min   Electrical Stimulation Goals Pain   Vasopneumatic   Number Minutes Vasopneumatic  15 minutes   Vasopnuematic Location  Knee   Vasopneumatic Pressure Medium   Vasopneumatic Temperature  52   Manual Therapy   Manual Therapy Soft tissue mobilization;Passive ROM   Soft tissue mobilization R  patellar mobilizations into L/R, sup/inf, tilts   Passive ROM R knee into flex/ext with gentle holds at end range                     PT Long Term Goals - 11/24/14 1151    PT LONG TERM GOAL #1   Title Ind with advanced HEP.   Baseline No knowledge of advanced ther ex.   Time 6   Period Weeks   Status On-going   PT LONG TERM GOAL #2   Title Full active right knee extension.   Baseline -8 degrees.   Time 6   Period Weeks   Status Achieved  11/24/2014 0 deg  AROM   PT LONG TERM GOAL #3   Title Active right knee flexion to 115 degrees.   Baseline 90 degrees.   Time 6   Period Weeks   Status Achieved  11/24/2014 AROM 115 deg   PT LONG TERM GOAL #4   Title Increase right knee strength to 5/5.   Baseline Decreased volitional contraction of right quadriceps.   Time 6   Period Weeks   Status On-going   PT LONG TERM GOAL #5   Title Perform ADL's with pain not > 3/10.   Baseline Pain currently rated at 7-8/10.   Time 6   Period Weeks   Status On-going               Plan - 11/24/14 1202    Clinical Impression Statement Patient did very well in therapy today with exercises directed without complaints of increased pain. AROM of R knee measured as 0- 115 deg in supine. Demonstrates good R patellar mobility in all directions. Achieved both R knee ROM goals set at evaluation today in clinic. Ambulated into therapy gym with [facility] and ted hose as well as incision dressing and steristrips per patient report. Normal modalites response noted following removal of the modalties. Denied pain following treatment.   Pt will benefit from skilled therapeutic intervention in order to improve on the following deficits Pain;Decreased activity tolerance;Decreased range of motion;Decreased strength;Increased edema   Rehab Potential Good   PT Frequency 3x / week   PT Duration 4 weeks   PT Treatment/Interventions ADLs/Self Care Home Management;Cryotherapy;Psychologist, clinical;Therapeutic activities;Therapeutic exercise;Neuromuscular re-education;Patient/family education;Passive range of motion;Vasopneumatic Device   PT Next Visit Plan Continue per unicompartmental knee replacement protocol.    Consulted and Agree with Plan of Care Patient        Problem List Patient Active Problem List   Diagnosis Date Noted  . Back pain 02/12/2014  . Nausea alone  12/05/2013  . PUD (peptic ulcer disease) 09/05/2013  . Unspecified constipation 09/05/2013  . Expected blood loss anemia 07/16/2013  . Overweight (BMI 25.0-29.9) 07/16/2013  . 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 Abdominal pain, unspecified site 02/13/2013  . Rectal bleeding 02/05/2013  . Abdominal pain, other specified site 02/05/2013    Wynelle Fanny, PTA 11/24/2014, 12:19 PM  [facility] [address] [place], [place], [address] Phone: [phone]   Fax:  [phone]

## 2014-12-01 ENCOUNTER — Encounter: Payer: Medicaid Other | Admitting: Physical Therapy

## 2014-12-19 ENCOUNTER — Other Ambulatory Visit: Payer: Self-pay | Admitting: Gastroenterology

## 2015-06-03 ENCOUNTER — Telehealth: Payer: Self-pay | Admitting: Internal Medicine

## 2015-06-03 NOTE — Telephone Encounter (Signed)
Pt has not been seen in 11/2 years. If she is having abd pain, she will need to be seen. I tried to call pt- NA- LMOM, asked her to call back and schedule.

## 2015-06-03 NOTE — Telephone Encounter (Signed)
MADE PATIENT APPT WHILE I HAD HER ON THE PHONE FOR February 2017

## 2015-06-03 NOTE — Telephone Encounter (Signed)
[name], SHE HAS A FEW QUESTIONS ABOUT HER STOMACH PAIN

## 2015-06-07 IMAGING — CR DG CHEST 1V PORT
1 series · 1 of 1 positions shown · non-contrast
Comparison: 06/17/2009

CLINICAL DATA: Epigastric pain since [REDACTED].  History of
hypertension, hiatal hernia.

PORTABLE CHEST - 1 VIEW

[portable]
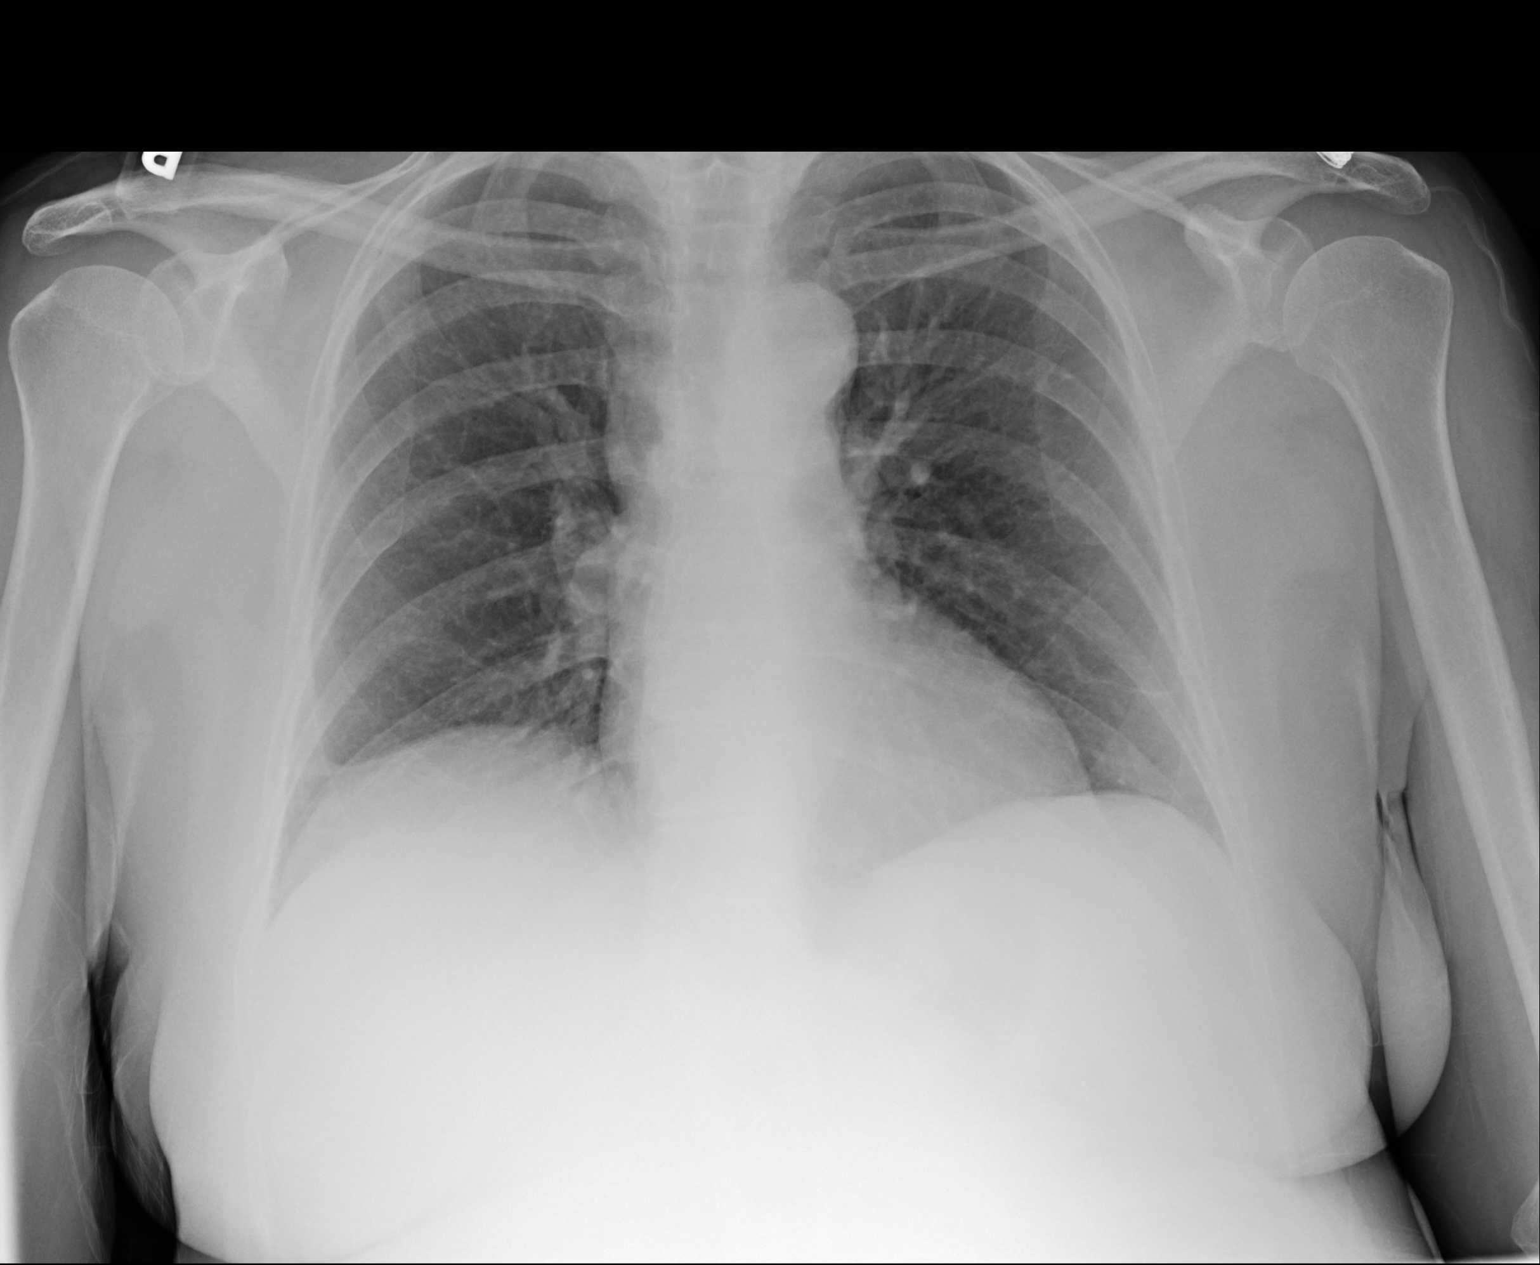

[1 of 1 positions shown; findings below may reference images not displayed]

FINDINGS: Cardiomediastinal silhouette is within normal limits.
There are no focal consolidations or pleural effusions.  There is
minimal atelectasis or scarring at the right lung base.

No free intraperitoneal air seen beneath the diaphragm.
IMPRESSION: No evidence for acute cardiopulmonary abnormality.

## 2015-06-19 ENCOUNTER — Ambulatory Visit: Payer: Medicaid Other | Admitting: Gastroenterology

## 2015-06-24 ENCOUNTER — Ambulatory Visit: Payer: Medicaid Other | Admitting: Gastroenterology

## 2015-07-08 ENCOUNTER — Ambulatory Visit (INDEPENDENT_AMBULATORY_CARE_PROVIDER_SITE_OTHER): Payer: Medicaid Other | Admitting: Gastroenterology

## 2015-07-08 ENCOUNTER — Encounter: Payer: Self-pay | Admitting: Gastroenterology

## 2015-07-08 ENCOUNTER — Telehealth: Payer: Self-pay | Admitting: General Practice

## 2015-07-08 ENCOUNTER — Telehealth: Payer: Self-pay

## 2015-07-08 VITALS — BP 129/88 | HR 86 | Temp 98.6°F | Ht 63.0 in | Wt 152.0 lb

## 2015-07-08 DIAGNOSIS — K5900 Constipation, unspecified: Secondary | ICD-10-CM | POA: Diagnosis not present

## 2015-07-08 DIAGNOSIS — R1013 Epigastric pain: Secondary | ICD-10-CM | POA: Diagnosis not present

## 2015-07-08 MED ORDER — LINACLOTIDE 290 MCG PO CAPS: 290.0000 ug | ORAL_CAPSULE | Freq: Every day | ORAL | Status: DC

## 2015-07-08 NOTE — Assessment & Plan Note (Signed)
60 year old female with history of PUD, last EGD in 2015 with evidence of healed ulcer, now presenting with what seems to be a symptomatic ventral hernia. On exam, query abdominal wall laxity, possible hernia. Will proceed with CT abd/pelvis now.

## 2015-07-08 NOTE — Telephone Encounter (Signed)
Pt called back asking about a surgical referral. She states her pain is a 7/10. States she can't wait.  Told her that Vicente Males recommended a CT scan before doing anything surgical.   Told pt CT scan PA is still pending. Told pt if her pain is worse than earlier today to go to the ER per Anna's previous phone note.

## 2015-07-08 NOTE — Telephone Encounter (Signed)
Opened in error

## 2015-07-08 NOTE — Progress Notes (Signed)
CC'ED TO PCP 

## 2015-07-08 NOTE — Telephone Encounter (Signed)
Agree 

## 2015-07-08 NOTE — Patient Instructions (Signed)
I have refilled Linzess for you.   I would like to do a CT scan to assess for a hernia. You will need to do blood work before just to make sure your kidney function is good before receiving the contrast before the CT.   Further recommendations to follow! If you have severe abdominal pain that does not go away, please go to the emergency room.

## 2015-07-08 NOTE — Telephone Encounter (Signed)
I spoke to the pt and she said the pain is more severe when she is standing. 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 She sound a little short of breath and said that is because of knee problems and difficulty walking. She rated her abdominal pain at a 4 now. She is waiting for a call for the CT appt.  Sending to Laban Emperor, NP who saw her in the office today.

## 2015-07-08 NOTE — Telephone Encounter (Signed)
Noted. She is aware to go to the ED if severe abdominal pain.

## 2015-07-08 NOTE — Telephone Encounter (Signed)
Patient called in stating she is having severe abd pain when she stands. Per AS note from ov today the patient should report to the ER for any severe abd pain.  Routing to [employer]

## 2015-07-08 NOTE — Assessment & Plan Note (Signed)
Doing well on Linzess 290 mcg daily. Refills provided.

## 2015-07-08 NOTE — Progress Notes (Signed)
Referring Provider: Raiford Simmonds., PA-C Primary Care Physician:  Raiford Simmonds., PA-C  Primary GI: Dr. Gala Romney   Chief Complaint  Patient presents with  . stomach knotting up    HPI:   [name] is a 60 y.o. female presenting today with a history of  PUD secondary to aspirin powder use.  EGD in May 2015 with non-critical Schatzki's ring, not manipulated, healed ulcer, gastric bx negative. Last seen in July 2015 and had lost a small amount of weight. Noted nausea at that time. Was given Zofran and instructions for close follow-up. She returns today with concerns. Her weight is actually up from last visits.   Will lean back and abdomen will "knot up" and protrude. Has to take her hands and "rub it out". Worsened with movement. Pain started about a month or so ago but has been worsening. Was bent over in shower shaving legs and when she came back up, her stomach was blown up in a big knot and started screaming. To the touch it is quite sore. Sometimes will have nausea. No NSAIDs or aspirin powders. Linzess 290 mcg daily. Needs refill. Prilosec once each day. Since having back and knee surgery, feels limited to where she can't do anything.     Past Medical History  Diagnosis Date  . Hiatal hernia   . Palpitations   . Anxiety   . Hypertension   . Hypercholesterolemia   . Depression   . Chest pain     "STRESS"; had normal nuclear stress test 02/2011; denied recent history 02/10/14  . Arthritis   . GERD (gastroesophageal reflux disease)   . History of stomach ulcers   . Difficulty sleeping     mind racing  . Bipolar 1 disorder ([place])   . Back pain, chronic   . Heart murmur     childhood    Past Surgical History  Procedure Laterality Date  . Appendectomy    . Carpal tunnel release      BILATERAL  . Colonoscopy, esophagogastroduodenoscopy (egd) and esophageal dilation N/A 02/06/2013    RMR. Benign colon polyp, diverticulosis, hemorrhoids. EGD with Schatzki's ring s/p 7  F dilation, 73mm elliptical prepyloric antral ulcer with adjacent scarring, negative path  . Partial knee arthroplasty Left 07/15/2013    Procedure: UNICOMPARTMENTAL LEFT KNEE MEDIAL ;  Surgeon: [name], MD;  Location: [facility];  Service: Orthopedics;  Laterality: Left;  . Esophagogastroduodenoscopy N/A 09/23/2013    Dr. Gala Romney: non-critical Schatzki's ring, not manipulated, healed ulcer, gastric bx negative  . Lumbar fusion  02/12/2014    LEVEL 1 L4 L5    DR BROOKS    Current Outpatient Prescriptions  Medication Sig Dispense Refill  . clonazePAM (KLONOPIN) 0.5 MG tablet Take 1 mg by mouth 2 (two) times daily as needed for anxiety.    . Linaclotide (LINZESS) 290 MCG CAPS capsule Take 1 capsule (290 mcg total) by mouth daily. 30 minutes before breakfast 30 capsule 11  . lisinopril (PRINIVIL,ZESTRIL) 20 MG tablet Take 20 mg by mouth every morning.     Marland Kitchen omeprazole (PRILOSEC) 20 MG capsule TAKE ONE CAPSULE BY MOUTH ONE TIME DAILY 30 capsule 5  . docusate sodium (COLACE) 100 MG capsule Take 1 capsule (100 mg total) by mouth 2 (two) times daily. (Patient not taking: Reported on 11/17/2014) 50 capsule 0  . polyethylene glycol (MIRALAX / GLYCOLAX) packet Take 17 g by mouth daily. (Patient not taking: Reported on 07/16/2014) 30 each 0  No current facility-administered medications for this visit.    Allergies as of 07/08/2015 - Review Complete 07/08/2015  Allergen Reaction Noted  . Morphine and related Nausea Only 01/24/2013    Family History  Problem Relation Age of Onset  . Heart attack Mother   . Colon cancer Neg Hx     Social History   Social History  . Marital Status: Single    Spouse Name: N/A  . Number of Children: 2  . Years of Education: N/A   Social History Main Topics  . Smoking status: Never Smoker   . Smokeless tobacco: Never Used     Comment: Never smoked  . Alcohol Use: No  . Drug Use: No  . Sexual Activity: No   Other Topics Concern  . None   Social History  Narrative    Review of Systems: Gen: see HPI  CV: Denies chest pain, palpitations, syncope, peripheral edema, and claudication. Resp: +DOE GI: see HPI  Derm: Denies rash, itching, dry skin Psych: +anxiety  Heme: Denies bruising, bleeding, and enlarged lymph nodes.  Physical Exam: BP 129/88 mmHg  Pulse 86  Temp(Src) 98.6 F (37 C)  Ht 5\' 3"  (1.6 m)  Wt 152 lb (68.947 kg)  BMI 26.93 kg/m2 General:   Alert and oriented. No distress noted. Pleasant and cooperative.  Head:  Normocephalic and atraumatic. Eyes:  Conjuctiva clear without scleral icterus. Mouth:  Oral mucosa pink and moist. Good dentition. No lesions. Heart:  S1, S2 present without murmurs, rubs, or gallops. Regular rate and rhythm. Abdomen:  +BS, soft, TTP upper abdomen and non-distended. No rebound or guarding. Query ventral hernia supraumbilically Msk:  Symmetrical without gross deformities. Normal posture. Extremities:  Without edema. Neurologic:  Alert and  oriented x4;  grossly normal neurologically. Skin:  Intact without significant lesions or rashes. Psych:  Alert and cooperative. Normal mood and affect.

## 2015-07-09 ENCOUNTER — Encounter (HOSPITAL_COMMUNITY): Payer: Self-pay | Admitting: Emergency Medicine

## 2015-07-09 ENCOUNTER — Telehealth: Payer: Self-pay | Admitting: Internal Medicine

## 2015-07-09 ENCOUNTER — Emergency Department (HOSPITAL_COMMUNITY): Payer: Medicaid Other

## 2015-07-09 ENCOUNTER — Emergency Department (HOSPITAL_COMMUNITY)
Admission: EM | Admit: 2015-07-09 | Discharge: 2015-07-09 | Disposition: A | Discharge status: Home / Self Care | Payer: Medicaid Other | Attending: Emergency Medicine | Admitting: Emergency Medicine

## 2015-07-09 DIAGNOSIS — F319 Bipolar disorder, unspecified: Secondary | ICD-10-CM | POA: Insufficient documentation

## 2015-07-09 DIAGNOSIS — R112 Nausea with vomiting, unspecified: Secondary | ICD-10-CM | POA: Diagnosis not present

## 2015-07-09 DIAGNOSIS — G8929 Other chronic pain: Secondary | ICD-10-CM | POA: Diagnosis not present

## 2015-07-09 DIAGNOSIS — R109 Unspecified abdominal pain: Secondary | ICD-10-CM | POA: Diagnosis present

## 2015-07-09 DIAGNOSIS — K219 Gastro-esophageal reflux disease without esophagitis: Secondary | ICD-10-CM | POA: Diagnosis not present

## 2015-07-09 DIAGNOSIS — R140 Abdominal distension (gaseous): Secondary | ICD-10-CM | POA: Insufficient documentation

## 2015-07-09 DIAGNOSIS — Z8739 Personal history of other diseases of the musculoskeletal system and connective tissue: Secondary | ICD-10-CM | POA: Diagnosis not present

## 2015-07-09 DIAGNOSIS — I10 Essential (primary) hypertension: Secondary | ICD-10-CM | POA: Diagnosis not present

## 2015-07-09 DIAGNOSIS — Z9049 Acquired absence of other specified parts of digestive tract: Secondary | ICD-10-CM | POA: Insufficient documentation

## 2015-07-09 DIAGNOSIS — Z79899 Other long term (current) drug therapy: Secondary | ICD-10-CM | POA: Insufficient documentation

## 2015-07-09 DIAGNOSIS — R1013 Epigastric pain: Secondary | ICD-10-CM | POA: Insufficient documentation

## 2015-07-09 DIAGNOSIS — E7800 Pure hypercholesterolemia, unspecified: Secondary | ICD-10-CM | POA: Insufficient documentation

## 2015-07-09 DIAGNOSIS — R011 Cardiac murmur, unspecified: Secondary | ICD-10-CM | POA: Insufficient documentation

## 2015-07-09 DIAGNOSIS — F419 Anxiety disorder, unspecified: Secondary | ICD-10-CM | POA: Insufficient documentation

## 2015-07-09 LAB — CBC WITH DIFFERENTIAL/PLATELET
Basophils Absolute: 0 10*3/uL (ref 0.0–0.1)
Basophils Relative: 1 %
EOS PCT: 3 %
Eosinophils Absolute: 0.1 10*3/uL (ref 0.0–0.7)
HEMATOCRIT: 38.2 % (ref 36.0–46.0)
Hemoglobin: 12.7 g/dL (ref 12.0–15.0)
LYMPHS ABS: 1.4 10*3/uL (ref 0.7–4.0)
LYMPHS PCT: 31 %
MCH: 29.8 pg (ref 26.0–34.0)
MCHC: 33.2 g/dL (ref 30.0–36.0)
MCV: 89.7 fL (ref 78.0–100.0)
MONO ABS: 0.3 10*3/uL (ref 0.1–1.0)
MONOS PCT: 7 %
NEUTROS ABS: 2.7 10*3/uL (ref 1.7–7.7)
Neutrophils Relative %: 58 %
PLATELETS: 231 10*3/uL (ref 150–400)
RBC: 4.26 MIL/uL (ref 3.87–5.11)
RDW: 13.5 % (ref 11.5–15.5)
WBC: 4.6 10*3/uL (ref 4.0–10.5)

## 2015-07-09 LAB — COMPREHENSIVE METABOLIC PANEL
ALBUMIN: 4.2 g/dL (ref 3.5–5.0)
ALT: 23 U/L (ref 14–54)
AST: 32 U/L (ref 15–41)
Alkaline Phosphatase: 114 U/L (ref 38–126)
Anion gap: 6 (ref 5–15)
BUN: 13 mg/dL (ref 6–20)
CHLORIDE: 109 mmol/L (ref 101–111)
CO2: 26 mmol/L (ref 22–32)
CREATININE: 0.75 mg/dL (ref 0.44–1.00)
Calcium: 9.1 mg/dL (ref 8.9–10.3)
GFR calc Af Amer: >60 mL/min (ref >60)
GLUCOSE: 96 mg/dL (ref 65–99)
POTASSIUM: 4.3 mmol/L (ref 3.5–5.1)
SODIUM: 141 mmol/L (ref 135–145)
Total Bilirubin: 0.8 mg/dL (ref 0.3–1.2)
Total Protein: 7.7 g/dL (ref 6.5–8.1)

## 2015-07-09 LAB — LIPASE, BLOOD: Lipase: 33 U/L (ref 11–51)

## 2015-07-09 LAB — URINALYSIS, ROUTINE W REFLEX MICROSCOPIC
Bilirubin Urine: NEGATIVE
GLUCOSE, UA: NEGATIVE mg/dL
KETONES UR: NEGATIVE mg/dL
Nitrite: NEGATIVE
PH: 5.5 (ref 5.0–8.0)
PROTEIN: NEGATIVE mg/dL
Specific Gravity, Urine: 1.025 (ref 1.005–1.030)

## 2015-07-09 LAB — URINE MICROSCOPIC-ADD ON

## 2015-07-09 MED ORDER — SODIUM CHLORIDE 0.9 % IV SOLN
INTRAVENOUS | Status: DC
  Administered 2015-07-09: 13:00:00 via INTRAVENOUS

## 2015-07-09 MED ORDER — FENTANYL CITRATE (PF) 100 MCG/2ML IJ SOLN
50.0000 ug | Freq: Once | INTRAMUSCULAR | Status: AC
  Administered 2015-07-09: 50 ug via INTRAVENOUS
  Filled 2015-07-09: qty 2

## 2015-07-09 MED ORDER — ONDANSETRON HCL 4 MG/2ML IJ SOLN
4.0000 mg | Freq: Once | INTRAMUSCULAR | Status: AC
  Administered 2015-07-09: 4 mg via INTRAVENOUS
  Filled 2015-07-09: qty 2

## 2015-07-09 MED ORDER — IOHEXOL 300 MG/ML  SOLN
100.0000 mL | Freq: Once | INTRAMUSCULAR | Status: AC | PRN
  Administered 2015-07-09: 100 mL via INTRAVENOUS

## 2015-07-09 MED ORDER — TRAMADOL HCL 50 MG PO TABS: 50.0000 mg | ORAL_TABLET | Freq: Four times a day (QID) | ORAL | Status: DC | PRN

## 2015-07-09 MED ORDER — SUCRALFATE 1 GM/10ML PO SUSP: 1.0000 g | Freq: Three times a day (TID) | ORAL | Status: DC

## 2015-07-09 NOTE — ED Notes (Signed)
PT c/o midline abdominal pain with hernia area x2-3 weeks. PT was seen at [facility] by Linus Salmons, NP yesterday with outpatient CT and blood work ordered but pt was told to come to ED by MD office this am bc pain has worsened.

## 2015-07-09 NOTE — Telephone Encounter (Signed)
Noted  

## 2015-07-09 NOTE — ED Provider Notes (Signed)
CSN: [id number]     Arrival date & time 07/09/15  1130 History   By signing my name below, I, [name], attest that this documentation has been prepared under the direction and in the presence of Carmin Muskrat, MD Electronically Signed: Evonnie Dawes, ED Scribe 07/09/2015 at 12:10 PM.    Chief Complaint  Patient presents with  . Abdominal Pain   The history is provided by the patient. No language interpreter was used.    HPI Comments: [name] is a 60 y.o. femalewith a pmshx of an appendectomy who presents to the Emergency Department complaining of a cramping pain in her abdomen with associated, worsening, distension. Pain has increased considerably in the past 3-4 days, however discomfort has been increasing constantly for the past 3 weeks. Pt noted increased bulging to the abdominal area, nausea, and vomiting. Pt's last BM was 2 nights ago. PT was seen at [facility] by Linus Salmons, NP yesterday. Pt denies any confusion, syncope or fever. Pt is allergic to morphine only.   Past Medical History  Diagnosis Date  . Hiatal hernia   . Palpitations   . Anxiety   . Hypertension   . Hypercholesterolemia   . Depression   . Chest pain     "STRESS"; had normal nuclear stress test 02/2011; denied recent history 02/10/14  . Arthritis   . GERD (gastroesophageal reflux disease)   . History of stomach ulcers   . Difficulty sleeping     mind racing  . Bipolar 1 disorder ([place])   . Back pain, chronic   . Heart murmur     childhood   Past Surgical History  Procedure Laterality Date  . Appendectomy    . Carpal tunnel release      BILATERAL  . Colonoscopy, esophagogastroduodenoscopy (egd) and esophageal dilation N/A 02/06/2013    RMR. Benign colon polyp, diverticulosis, hemorrhoids. EGD with Schatzki's ring s/p 29 F dilation, 46mm elliptical prepyloric antral ulcer with adjacent scarring, negative path  . Partial knee arthroplasty Left 07/15/2013    Procedure:  UNICOMPARTMENTAL LEFT KNEE MEDIAL ;  Surgeon: [name], MD;  Location: [facility];  Service: Orthopedics;  Laterality: Left;  . Esophagogastroduodenoscopy N/A 09/23/2013    Dr. Gala Romney: non-critical Schatzki's ring, not manipulated, healed ulcer, gastric bx negative  . Lumbar fusion  02/12/2014    LEVEL 1 L4 L5    DR BROOKS  . Back surgery     Family History  Problem Relation Age of Onset  . Heart attack Mother   . Colon cancer Neg Hx    Social History  Substance Use Topics  . Smoking status: Never Smoker   . Smokeless tobacco: Never Used     Comment: Never smoked  . Alcohol Use: No   OB History    Gravida Para Term Preterm AB TAB SAB Ectopic Multiple Living            2     Review of Systems  Constitutional: Negative for fever.  Gastrointestinal: Positive for nausea, vomiting and abdominal pain.       Abdominal hernia  Neurological: Negative for syncope.  All other systems reviewed and are negative.   Allergies  Morphine and related  Home Medications   Prior to Admission medications   Medication Sig Start Date End Date Taking? Authorizing Provider  clonazePAM (KLONOPIN) 0.5 MG tablet Take 1 mg by mouth 2 (two) times daily as needed for anxiety.   Yes Historical Provider, MD  Linaclotide (  LINZESS) 290 MCG CAPS capsule Take 1 capsule (290 mcg total) by mouth daily. Patient taking differently: Take 290 mcg by mouth daily as needed (constipation).  07/08/15  Yes Orvil Feil, NP  lisinopril (PRINIVIL,ZESTRIL) 20 MG tablet Take 20 mg by mouth every morning.    Yes Historical Provider, MD  Multiple Vitamin (MULTIVITAMIN WITH MINERALS) TABS tablet Take 1 tablet by mouth daily.   Yes Historical Provider, MD  omeprazole (PRILOSEC) 20 MG capsule TAKE ONE CAPSULE BY MOUTH ONE TIME DAILY 12/19/14  Yes Carlis Stable, NP  simvastatin (ZOCOR) 20 MG tablet Take 20 mg by mouth daily.   Yes Historical Provider, MD   BP 135/89 mmHg  Pulse 88  Temp(Src) 98.1 F (36.7 C) (Oral)  Resp 18  Ht  5\' 4"  (1.626 m)  Wt 152 lb (68.947 kg)  BMI 26.08 kg/m2  SpO2 100% Physical Exam  Abdominal: There is tenderness.  Diffuse and tender abdomen with most tenderness in the upper belly.     ED Course  Procedures  DIAGNOSTIC STUDIES: Oxygen Saturation is 100% on RA, normal by my interpretation.    COORDINATION OF CARE: 12:05 PM-Discussed treatment plan which includes CT / labs, analgesia  with pt at bedside and pt agreed to plan.   Labs Review Labs Reviewed  URINALYSIS, ROUTINE W REFLEX MICROSCOPIC (NOT AT [facility]) - Abnormal; Notable for the following:    Hgb urine dipstick TRACE (*)    Leukocytes, UA TRACE (*)    All other components within normal limits  URINE MICROSCOPIC-ADD ON - Abnormal; Notable for the following:    Squamous Epithelial / LPF 0-5 (*)    Bacteria, UA RARE (*)    All other components within normal limits  COMPREHENSIVE METABOLIC PANEL  LIPASE, BLOOD  CBC WITH DIFFERENTIAL/PLATELET    Imaging Review Ct Abdomen Pelvis W Contrast  07/09/2015  CLINICAL DATA:  Abdominal distention over the past 3 weeks, worsening over the last 3-4 days. EXAM: CT ABDOMEN AND PELVIS WITH CONTRAST TECHNIQUE: Multidetector CT imaging of the abdomen and pelvis was performed using the standard protocol following bolus administration of intravenous contrast. CONTRAST:  157mL OMNIPAQUE IOHEXOL 300 MG/ML  SOLN COMPARISON:  02/01/2013 FINDINGS: Lower chest: Mild atelectasis in both lower lobes and in the right middle lobe. Left anterior descending coronary artery atherosclerosis with mild cardiomegaly. Small type 1 hiatal hernia. The hiatal hernia contains oral contrast medium. Hepatobiliary: 5.1 by 3.7 cm simple cyst in segment 3 of the liver. Mild hepatic steatosis. Pancreas: 6 by 4 mm hypodense lesion in the head of the pancreas on image 22 series 2, no change from 02/01/2013, considered benign. Spleen: Unremarkable Adrenals/Urinary Tract: Unremarkable Stomach/Bowel: Scattered sigmoid colon  diverticula without active diverticulitis. Vascular/Lymphatic: Aortoiliac atherosclerotic vascular disease. No pathologic adenopathy. Reproductive: Unremarkable Other: No supplemental non-categorized findings. Musculoskeletal: Old healed fractures of the inferior pubic rami. Spondylosis and degenerative disc disease at T12-L1 with 3 mm degenerative retrolisthesis. posterolateral rod and pedicle screw fixation with posterior decompression at L4-5. Left facet arthropathy at L5-S1 but without overt impingement. Small umbilical hernia contains adipose tissue. IMPRESSION: 1. Small type 1 hiatal hernia contains contrast from the stomach suspicious for reflux. 2. Mild cardiomegaly with left anterior descending coronary artery atherosclerotic calcification. 3. Benign-appearing left hepatic lobe cyst. 4. Average size 5 mm pancreatic head cystic lesion, no change from 2014, considered benign. 5. Lumbar spondylosis and degenerative disc disease as noted above. No overt impingement seen. 6.  Aortoiliac atherosclerotic vascular disease. 7. Sigmoid diverticulosis. Electronically  Signed   By: Van Clines M.D.   On: 07/09/2015 14:40   I have personally reviewed and evaluated these images and lab results as part of my medical decision-making.  3:43 PM Patient in no distress.  I had a lengthy conversation with the patient and her daughter about the findings.  She will f/u w GI and gen surg.   MDM  I personally performed the services described in this documentation, which was scribed in my presence. The recorded information has been reviewed and is accurate.   Patient presents with concern of ongoing abdominal pain. Patient is awake, alert, hemodynamically stable, with soft, non-peritoneal abdomen, with mild midline tenderness. No evidence for obstruction. Symptoms maybe secondary to symptomatic hiatal hernia, or reflux. Patient symptoms well controlled here, and with stable vitals, otherwise reassuring labs,  she'll follow-up as an outpatient.  Carmin Muskrat, MD 07/09/15 [phone]

## 2015-07-09 NOTE — Telephone Encounter (Signed)
AS please advise  

## 2015-07-09 NOTE — Telephone Encounter (Signed)
Pt was seen yesterday by AS. She called this morning saying she is hurting so bad that she is going to the ER.

## 2015-07-09 NOTE — Telephone Encounter (Signed)
Routing to AS 

## 2015-07-09 NOTE — Discharge Instructions (Signed)
As discussed, your evaluation today has been largely reassuring. Symptoms are likely due to combination of your hiatal hernia, and irritation from stomach acid.  It is important that you continue to have your condition evaluated by outpatient providers.  Return here for concerning changes in your condition.

## 2015-07-09 NOTE — Telephone Encounter (Signed)
Pt was seen yesterday by AS and patient called this morning saying she was in so much pain that she was going to the ER. This afternoon a family member called to say that she had just left the ER and was diagnosed with a hernia and needed to be referred to a surgeon at [facility]. She said the CT was done today and ER doctor wanted her to have surgery done within the next week and we needed to do the referral. Please advise and call [phone]

## 2015-07-10 NOTE — Telephone Encounter (Signed)
She has a small umbilical hernia on CT. It is not a STAT situation. No concerning features on CT. If she wants to be referred to [facility], we can do that. However, we could refer to Dr. Arnoldo Morale here as well, which would be in town for her.

## 2015-07-13 ENCOUNTER — Other Ambulatory Visit: Payer: Self-pay

## 2015-07-13 DIAGNOSIS — K429 Umbilical hernia without obstruction or gangrene: Secondary | ICD-10-CM

## 2015-07-13 NOTE — Telephone Encounter (Signed)
Pt is aware of appointment 

## 2015-07-13 NOTE — Telephone Encounter (Signed)
Pt is set up for appointment on 07/20/15 @ 2:00 pm. Left message in machine.

## 2015-07-13 NOTE — Telephone Encounter (Signed)
Pt called this morning and she would like to go to [facility].

## 2015-07-13 NOTE — Telephone Encounter (Signed)
Referral has been made.

## 2015-07-17 ENCOUNTER — Other Ambulatory Visit (HOSPITAL_COMMUNITY): Payer: Medicaid Other

## 2015-07-20 NOTE — Telephone Encounter (Signed)
Patient called in and stated that she went to [facility] and saw Dr. Anne Hahn and he would like for her to have an upper endoscopy done first prior to her having surgery for her hiatal hernia.  I will route to Manuela Schwartz to get records from [facility].

## 2015-07-21 ENCOUNTER — Telehealth: Payer: Self-pay | Admitting: Internal Medicine

## 2015-07-21 ENCOUNTER — Other Ambulatory Visit: Payer: Self-pay

## 2015-07-21 NOTE — Telephone Encounter (Signed)
Routing to Anna

## 2015-07-21 NOTE — Telephone Encounter (Signed)
I didn't refer for a hiatal hernia. I referred for umbilical hernia. She was complaining of protruding area in her abdomen. On the CT, she has a SMALL hiatal hernia. She also had a small umbilical hernia, which is where her pain was. I need to see Dr. Lucendia Herrlich notes before we go any further.

## 2015-07-21 NOTE — Telephone Encounter (Signed)
Routing to [employer] to get notes from [facility]

## 2015-07-21 NOTE — Telephone Encounter (Signed)
Requested records from [facility].  °

## 2015-07-21 NOTE — Telephone Encounter (Signed)
OV made °

## 2015-07-21 NOTE — Telephone Encounter (Signed)
Pt called to see if she could get scheduled for EGD. I have requested records from [facility] this morning. AS wants to review prior to scheduling per CJ. Patient is asking for a prescription of tramadol and uses [facility] in [place]. Please advise

## 2015-07-21 NOTE — Telephone Encounter (Signed)
Reviewed notes from Dr. [name]. It appears his notes state I referred due to a hiatal hernia. That was not my original intention. However, his consultation notes were reviewed and state he had reviewed the CT and would like patient to undergo EGD to confirm hiatal hernia and evaluate for any ulcerative disease. Her symptoms are not adding up. I think she needs an office visit prior to scheduling this, since the plan has changed so much since we have seen her. When I saw her, she was complaining of a protrusion and had no dysphagia. However, his notes state she DID have dysphagia. We do not provide ultram, unfortunately.   May put in with me in next available, non-urgent preferably. However, if there is not something available within the next week, it would need to be in an urgent slot.

## 2015-07-21 NOTE — Telephone Encounter (Signed)
Records placed in AS chair

## 2015-07-27 ENCOUNTER — Ambulatory Visit (INDEPENDENT_AMBULATORY_CARE_PROVIDER_SITE_OTHER): Payer: Self-pay | Admitting: Gastroenterology

## 2015-07-27 ENCOUNTER — Other Ambulatory Visit: Payer: Self-pay

## 2015-07-27 ENCOUNTER — Encounter: Payer: Self-pay | Admitting: Gastroenterology

## 2015-07-27 VITALS — BP 148/95 | HR 109 | Temp 98.3°F | Ht 63.0 in | Wt 151.8 lb

## 2015-07-27 DIAGNOSIS — R1310 Dysphagia, unspecified: Secondary | ICD-10-CM | POA: Insufficient documentation

## 2015-07-27 DIAGNOSIS — R1013 Epigastric pain: Secondary | ICD-10-CM

## 2015-07-27 MED ORDER — PANTOPRAZOLE SODIUM 40 MG PO TBEC: 40.0000 mg | DELAYED_RELEASE_TABLET | Freq: Every day | ORAL | Status: DC

## 2015-07-27 MED ORDER — ONDANSETRON HCL 4 MG PO TABS
4.0000 mg | ORAL_TABLET | Freq: Three times a day (TID) | ORAL | Status: DC
Start: 2015-07-27 — End: 2015-09-16

## 2015-07-27 NOTE — Progress Notes (Signed)
Referring Provider: Raiford Simmonds., PA-C Primary Care Physician:  Raiford Simmonds., PA-C  Primary GI: Dr. Gala Romney   Chief Complaint  Patient presents with  . Follow-up    HPI:   [name] is a 60 y.o. 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 EGD in May 2015 with non-critical Schatzki's ring, not manipulated, healed ulcer, gastric bx negative. At last visit in early March, she had noted "knotting" of her stomach when leaning back. Worsened with movement. Sore to the touch. CT abd/pelvis performed as I was concerned about an abdominal hernia as culprit. She ended up going to the ED for a CT scan due to bothersome symptoms. This noted a small type 1 hiatal hernia, average size 5 mm pancreatic head cystic lesion unchanged from 2014 and considered benign, and small umbilical hernia containing adipose tissue. She was seen by Dr. [name] with [facility] to evaluate for umbilical hernia repair. His consultation notes were reviewed, and stated he had reviewed the CT and would like patient to undergo an EGD to confirm presence of hiatal hernia and evaluate for any ulcerative disease. Per his notes, she was having dysphagia, which was not reported to me. She weighed in the 130s in 2015, now in the low 150s.   I reviewed CT with Dr. Zigmund Daniel. 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 States hamburger or chicken causes food to hang up in lower chest. Placed on Carafate and tramadol by ED. Carafate seemed to help alleviate some pain. Stomach starts bloating when eating. Associated abdominal discomfort. Stays very nauseated. No NSAIDs or aspirin powders. No vomiting.   Linzess only used as prn. Doesn't feel constipated. Prilosec once daily. Doesn't seem to be really helping much. States she develops a big knot that pokes out in upper abdomen and has to take her hands and rub it down.   Past Medical History  Diagnosis Date  . Hiatal hernia   .  Palpitations   . Anxiety   . Hypertension   . Hypercholesterolemia   . Depression   . Chest pain     "STRESS"; had normal nuclear stress test 02/2011; denied recent history 02/10/14  . Arthritis   . GERD (gastroesophageal reflux disease)   . History of stomach ulcers   . Difficulty sleeping     mind racing  . Bipolar 1 disorder ([place])   . Back pain, chronic   . Heart murmur     childhood    Past Surgical History  Procedure Laterality Date  . Appendectomy    . Carpal tunnel release      BILATERAL  . Colonoscopy, esophagogastroduodenoscopy (egd) and esophageal dilation N/A 02/06/2013    RMR. Benign colon polyp, diverticulosis, hemorrhoids. EGD with Schatzki's ring s/p 22 F dilation, 64mm elliptical prepyloric antral ulcer with adjacent scarring, negative path  . Partial knee arthroplasty Left 07/15/2013    Procedure: UNICOMPARTMENTAL LEFT KNEE MEDIAL ;  Surgeon: [name], MD;  Location: [facility];  Service: Orthopedics;  Laterality: Left;  . Esophagogastroduodenoscopy N/A 09/23/2013    Dr. Gala Romney: non-critical Schatzki's ring, not manipulated, healed ulcer, gastric bx negative  . Lumbar fusion  02/12/2014    LEVEL 1 L4 L5    DR BROOKS  . Back surgery      Current Outpatient Prescriptions  Medication Sig Dispense Refill  . clonazePAM (KLONOPIN) 0.5 MG tablet Take 1 mg by mouth 2 (two) times daily as needed for  anxiety.    . Linaclotide (LINZESS) 290 MCG CAPS capsule Take 1 capsule (290 mcg total) by mouth daily. (Patient taking differently: Take 290 mcg by mouth daily as needed (constipation). ) 90 capsule 3  . lisinopril (PRINIVIL,ZESTRIL) 20 MG tablet Take 20 mg by mouth every morning.     . Multiple Vitamin (MULTIVITAMIN WITH MINERALS) TABS tablet Take 1 tablet by mouth daily.    Marland Kitchen omeprazole (PRILOSEC) 20 MG capsule TAKE ONE CAPSULE BY MOUTH ONE TIME DAILY 30 capsule 5  . QUEtiapine (SEROQUEL) 100 MG tablet Take 100 mg by mouth at bedtime.    . simvastatin (ZOCOR) 20 MG tablet  Take 20 mg by mouth daily.    . sucralfate (CARAFATE) 1 GM/10ML suspension Take 10 mLs (1 g total) by mouth 4 (four) times daily -  with meals and at bedtime. 420 mL 0  . traMADol (ULTRAM) 50 MG tablet Take 1 tablet (50 mg total) by mouth every 6 (six) hours as needed. 15 tablet 0  . ondansetron (ZOFRAN) 4 MG tablet Take 1 tablet (4 mg total) by mouth 3 (three) times daily before meals. 90 tablet 3  . pantoprazole (PROTONIX) 40 MG tablet Take 1 tablet (40 mg total) by mouth daily. Take 30 minutes before breakfast daily. 90 tablet 3   No current facility-administered medications for this visit.    Allergies as of 07/27/2015 - Review Complete 07/27/2015  Allergen Reaction Noted  . Morphine and related Nausea Only 01/24/2013    Family History  Problem Relation Age of Onset  . Heart attack Mother   . Colon cancer Neg Hx     Social History   Social History  . Marital Status: Single    Spouse Name: N/A  . Number of Children: 2  . Years of Education: N/A   Social History Main Topics  . Smoking status: Never Smoker   . Smokeless tobacco: Never Used     Comment: Never smoked  . Alcohol Use: No  . Drug Use: No  . Sexual Activity: No   Other Topics Concern  . None   Social History Narrative    Review of Systems: Negative unless mentioned in HPI.   Physical Exam: BP 148/95 mmHg  Pulse 109  Temp(Src) 98.3 F (36.8 C) (Oral)  Ht 5\' 3"  (1.6 m)  Wt 151 lb 12.8 oz (68.856 kg)  BMI 26.90 kg/m2 General:   Alert and oriented. No distress noted. Pleasant and cooperative.  Head:  Normocephalic and atraumatic. Eyes:  Conjuctiva clear without scleral icterus. Mouth:  Oral mucosa pink and moist. Good dentition. No lesions. Heart:  S1, S2 present without murmurs, rubs, or gallops. Regular rate and rhythm. Abdomen:  +BS, soft, TTP upper abdomen, small umbilical hernia.  Msk:  Symmetrical without gross deformities. Normal posture. Extremities:  Without edema. Neurologic:  Alert and   oriented x4;  grossly normal neurologically. Skin:  Intact without significant lesions or rashes. Psych:  Alert and cooperative. Normal mood and affect.

## 2015-07-27 NOTE — Patient Instructions (Signed)
We have scheduled you for an upper endoscopy with dilation with Dr. Gala Romney in the near future.  We will refund the copay tomorrow after we review this with our manager (we have a new system).

## 2015-07-29 NOTE — Assessment & Plan Note (Signed)
60 year old female with history of PUD in remote past, with surveillance EGD in May 2015 noting non-critical Schatzki's ring not manipulated, healed ulcer, negative gastric biopsy. She has noted symptoms that were concerning for a hernia, with CT noting a small umbilical hernia and hiatal hernia. She was referred to Dr. [name] with [facility], who is requesting updated endoscopy for further evaluation of hiatal hernia and to rule out any ulcerative process. Also notes solid food dysphagia. Will pursue endoscopic evaluation in near future prior to her seeing Dr. Rosendo Gros again.   Proceed with upper endoscopy +/- dilation in the near future with Dr. Gala Romney. The risks, benefits, and alternatives have been discussed in detail with patient. They have stated understanding and desire to proceed.  PROPOFOL due to polypharmacy Stop Prilosec. Start Protonix

## 2015-07-29 NOTE — Assessment & Plan Note (Signed)
Query web, ring, stricture. Dilation as appropriate.

## 2015-07-30 NOTE — Progress Notes (Signed)
CC'ED TO PCP 

## 2015-08-04 NOTE — Patient Instructions (Signed)
AVISHA BARTOSH  08/04/2015     @PREFPERIOPPHARMACY @   Your procedure is scheduled on 08/10/2015.  Report to [facility] at 7:00 A.M.  Call this number if you have problems the morning of surgery:  [phone]   Remember:  Do not eat food or drink liquids after midnight.  Take these medicines the morning of surgery with A SIP OF WATER Klonopin, Lisinopril, Prilosec, Zofran, Protonix, Seroquel, Carafate, Tramadol   Do not wear jewelry, make-up or nail polish.  Do not wear lotions, powders, or perfumes.  You may wear deodorant.  Do not shave 48 hours prior to surgery.  Men may shave face and neck.  Do not bring valuables to the hospital.  [facility] is not responsible for any belongings or valuables.  Contacts, dentures or bridgework may not be worn into surgery.  Leave your suitcase in the car.  After surgery it may be brought to your room.  For patients admitted to the hospital, discharge time will be determined by your treatment team.  Patients discharged the day of surgery will not be allowed to drive home.    Please read over the following fact sheets that you were given. Anesthesia Post-op Instructions     PATIENT INSTRUCTIONS POST-ANESTHESIA  IMMEDIATELY FOLLOWING SURGERY:  Do not drive or operate machinery for the first twenty four hours after surgery.  Do not make any important decisions for twenty four hours after surgery or while taking narcotic pain medications or sedatives.  If you develop intractable nausea and vomiting or a severe headache please notify your doctor immediately.  FOLLOW-UP:  Please make an appointment with your surgeon as instructed. You do not need to follow up with anesthesia unless specifically instructed to do so.  WOUND CARE INSTRUCTIONS (if applicable):  Keep a dry clean dressing on the anesthesia/puncture wound site if there is drainage.  Once the wound has quit draining you may leave it open to air.  Generally you should leave the bandage  intact for twenty four hours unless there is drainage.  If the epidural site drains for more than 36-48 hours please call the anesthesia department.  QUESTIONS?:  Please feel free to call your physician or the hospital operator if you have any questions, and they will be happy to assist you.      Esophagogastroduodenoscopy Esophagogastroduodenoscopy (EGD) is a procedure that is used to examine the lining of the esophagus, stomach, and first part of the small intestine (duodenum). A long, flexible, lighted tube with a camera attached (endoscope) is inserted down the throat to view these organs. This procedure is done to detect problems or abnormalities, such as inflammation, bleeding, ulcers, or growths, in order to treat them. The procedure lasts 5-20 minutes. It is usually an outpatient procedure, but it may need to be performed in a hospital in emergency cases. LET [facility] CARE PROVIDER KNOW ABOUT:  Any allergies you have.  All medicines you are taking, including vitamins, herbs, eye drops, creams, and over-the-counter medicines.  Previous problems you or members of your family have had with the use of anesthetics.  Any blood disorders you have.  Previous surgeries you have had.  Medical conditions you have. RISKS AND COMPLICATIONS Generally, this is a safe procedure. However, problems can occur and include:  Infection.  Bleeding.  Tearing (perforation) of the esophagus, stomach, or duodenum.  Difficulty breathing or not being able to breathe.  Excessive sweating.  Spasms of the larynx.  Slowed heartbeat.  Low blood  pressure. BEFORE THE PROCEDURE  Do not eat or drink anything after midnight on the night before the procedure or as directed by your health care provider.  Do not take your regular medicines before the procedure if your health care provider asks you not to. Ask your health care provider about changing or stopping those medicines.  If you wear dentures, be  prepared to remove them before the procedure.  Arrange for someone to drive you home after the procedure. PROCEDURE  A numbing medicine (local anesthetic) may be sprayed in your throat for comfort and to stop you from gagging or coughing.  You will have an IV tube inserted in a vein in your hand or arm. You will receive medicines and fluids through this tube.  You will be given a medicine to relax you (sedative).  A pain reliever will be given through the IV tube.  A mouth guard may be placed in your mouth to protect your teeth and to keep you from biting on the endoscope.  You will be asked to lie on your left side.  The endoscope will be inserted down your throat and into your esophagus, stomach, and duodenum.  Air will be put through the endoscope to allow your health care provider to clearly view the lining of your esophagus.  The lining of your esophagus, stomach, and duodenum will be examined. During the exam, your health care provider may:  Remove tissue to be examined under a microscope (biopsy) for inflammation, infection, or other medical problems.  Remove growths.  Remove objects (foreign bodies) that are stuck.  Treat any bleeding with medicines or other devices that stop tissues from bleeding (hot cautery, clipping devices).  Widen (dilate) or stretch narrowed areas of your esophagus and stomach.  The endoscope will be withdrawn. AFTER THE PROCEDURE  You will be taken to a recovery area for observation. Your blood pressure, heart rate, breathing rate, and blood oxygen level will be monitored often until the medicines you were given have worn off.  Do not eat or drink anything until the numbing medicine has worn off and your gag reflex has returned. You may choke.  Your health care provider should be able to discuss his or her findings with you. It will take longer to discuss the test results if any biopsies were taken.   This information is not intended to  replace advice given to you by your health care provider. Make sure you discuss any questions you have with your health care provider.   Document Released: 08/26/2004 Document Revised: 05/16/2014 Document Reviewed: 03/28/2012 Elsevier Interactive Patient Education 2016 Elsevier [employer]. Esophageal Dilatation Esophageal dilatation is a procedure to open a blocked or narrowed part of the esophagus. The esophagus is the long tube in your throat that carries food and liquid from your mouth to your stomach. The procedure is also called esophageal dilation.  You may need this procedure if you have a buildup of scar tissue in your esophagus that makes it difficult, painful, or even impossible to swallow. This can be caused by gastroesophageal reflux disease (GERD). In rare cases, people need this procedure because they have cancer of the esophagus or a problem with the way food moves through the esophagus. Sometimes you may need to have another dilatation to enlarge the opening of the esophagus gradually. LET [facility] CARE PROVIDER KNOW ABOUT:   Any allergies you have.  All medicines you are taking, including vitamins, herbs, eye drops, creams, and over-the-counter medicines.  Previous  problems you or members of your family have had with the use of anesthetics.  Any blood disorders you have.  Previous surgeries you have had.  Medical conditions you have.  Any antibiotic medicines you are required to take before dental procedures. RISKS AND COMPLICATIONS Generally, this is a safe procedure. However, problems can occur and include:  Bleeding from a tear in the lining of the esophagus.  A hole (perforation) in the esophagus. BEFORE THE PROCEDURE  Do not eat or drink anything after midnight on the night before the procedure or as directed by your health care provider.  Ask your health care provider about changing or stopping your regular medicines. This is especially important if you are taking  diabetes medicines or blood thinners.  Plan to have someone take you home after the procedure. PROCEDURE   You will be given a medicine that makes you relaxed and sleepy (sedative).  A medicine may be sprayed or gargled to numb the back of the throat.  Your health care provider can use various instruments to do an esophageal dilatation. During the procedure, the instrument used will be placed in your mouth and passed down into your esophagus. Options include:  Simple dilators. This instrument is carefully placed in the esophagus to stretch it.  Guided wire bougies. In this method, a flexible tube (endoscope) is used to insert a wire into the esophagus. The dilator is passed over this wire to enlarge the esophagus. Then the wire is removed.  Balloon dilators. An endoscope with a small balloon at the end is passed down into the esophagus. Inflating the balloon gently stretches the esophagus and opens it up. AFTER THE PROCEDURE  Your blood pressure, heart rate, breathing rate, and blood oxygen level will be monitored often until the medicines you were given have worn off.  Your throat may feel slightly sore and will probably still feel numb. This will improve slowly over time.  You will not be allowed to eat or drink until the throat numbness has resolved.  If this is a same-day procedure, you may be allowed to go home once you have been able to drink, urinate, and sit on the edge of the bed without nausea or dizziness.  If this is a same-day procedure, you should have a friend or family member with you for the next 24 hours after the procedure.   This information is not intended to replace advice given to you by your health care provider. Make sure you discuss any questions you have with your health care provider.   Document Released: 06/16/2005 Document Revised: 05/16/2014 Document Reviewed: 09/04/2013 Elsevier Interactive Patient Education [employer].

## 2015-08-06 ENCOUNTER — Encounter (HOSPITAL_COMMUNITY)
Admission: RE | Admit: 2015-08-06 | Discharge: 2015-08-06 | Disposition: A | Discharge status: Home / Self Care | Payer: Medicaid Other | Source: Ambulatory Visit | Attending: Internal Medicine | Admitting: Internal Medicine

## 2015-08-06 ENCOUNTER — Encounter (HOSPITAL_COMMUNITY): Payer: Self-pay

## 2015-08-06 DIAGNOSIS — I10 Essential (primary) hypertension: Secondary | ICD-10-CM | POA: Diagnosis not present

## 2015-08-06 DIAGNOSIS — Z01810 Encounter for preprocedural cardiovascular examination: Secondary | ICD-10-CM | POA: Insufficient documentation

## 2015-08-10 ENCOUNTER — Encounter (HOSPITAL_COMMUNITY): Payer: Self-pay | Admitting: *Deleted

## 2015-08-10 ENCOUNTER — Ambulatory Visit (HOSPITAL_COMMUNITY): Payer: Medicaid Other | Admitting: Anesthesiology

## 2015-08-10 ENCOUNTER — Encounter (HOSPITAL_COMMUNITY)
Admission: RE | Disposition: A | Discharge status: Home / Self Care | Payer: Self-pay | Source: Ambulatory Visit | Attending: Internal Medicine

## 2015-08-10 ENCOUNTER — Ambulatory Visit (HOSPITAL_COMMUNITY)
Admission: RE | Admit: 2015-08-10 | Discharge: 2015-08-10 | Disposition: A | Discharge status: Home / Self Care | Payer: Medicaid Other | Source: Ambulatory Visit | Attending: Internal Medicine | Admitting: Internal Medicine

## 2015-08-10 DIAGNOSIS — K219 Gastro-esophageal reflux disease without esophagitis: Secondary | ICD-10-CM | POA: Diagnosis not present

## 2015-08-10 DIAGNOSIS — R1013 Epigastric pain: Secondary | ICD-10-CM | POA: Insufficient documentation

## 2015-08-10 DIAGNOSIS — R1310 Dysphagia, unspecified: Secondary | ICD-10-CM | POA: Insufficient documentation

## 2015-08-10 DIAGNOSIS — E7800 Pure hypercholesterolemia, unspecified: Secondary | ICD-10-CM | POA: Diagnosis not present

## 2015-08-10 DIAGNOSIS — F419 Anxiety disorder, unspecified: Secondary | ICD-10-CM | POA: Diagnosis not present

## 2015-08-10 DIAGNOSIS — K449 Diaphragmatic hernia without obstruction or gangrene: Secondary | ICD-10-CM | POA: Diagnosis not present

## 2015-08-10 DIAGNOSIS — I10 Essential (primary) hypertension: Secondary | ICD-10-CM | POA: Insufficient documentation

## 2015-08-10 DIAGNOSIS — M1991 Primary osteoarthritis, unspecified site: Secondary | ICD-10-CM | POA: Insufficient documentation

## 2015-08-10 DIAGNOSIS — Z79899 Other long term (current) drug therapy: Secondary | ICD-10-CM | POA: Insufficient documentation

## 2015-08-10 DIAGNOSIS — F319 Bipolar disorder, unspecified: Secondary | ICD-10-CM | POA: Diagnosis not present

## 2015-08-10 HISTORY — PX: MALONEY DILATION: SHX5535

## 2015-08-10 HISTORY — PX: ESOPHAGOGASTRODUODENOSCOPY (EGD) WITH PROPOFOL: SHX5813

## 2015-08-10 SURGERY — ESOPHAGOGASTRODUODENOSCOPY (EGD) WITH PROPOFOL
Anesthesia: Monitor Anesthesia Care

## 2015-08-10 MED ORDER — FENTANYL CITRATE (PF) 100 MCG/2ML IJ SOLN
INTRAMUSCULAR | Status: AC
  Filled 2015-08-10: qty 2

## 2015-08-10 MED ORDER — ONDANSETRON HCL 4 MG/2ML IJ SOLN
4.0000 mg | Freq: Once | INTRAMUSCULAR | Status: AC
  Administered 2015-08-10: 4 mg via INTRAVENOUS

## 2015-08-10 MED ORDER — PROPOFOL 500 MG/50ML IV EMUL
INTRAVENOUS | Status: DC | PRN
  Administered 2015-08-10: 100 ug/kg/min via INTRAVENOUS

## 2015-08-10 MED ORDER — FENTANYL CITRATE (PF) 100 MCG/2ML IJ SOLN
25.0000 ug | INTRAMUSCULAR | Status: AC
  Administered 2015-08-10 (×2): 25 ug via INTRAVENOUS

## 2015-08-10 MED ORDER — MIDAZOLAM HCL 2 MG/2ML IJ SOLN
1.0000 mg | INTRAMUSCULAR | Status: DC | PRN
  Administered 2015-08-10: 2 mg via INTRAVENOUS

## 2015-08-10 MED ORDER — LIDOCAINE VISCOUS 2 % MT SOLN
5.0000 mL | OROMUCOSAL | Status: AC
  Administered 2015-08-10 (×2): 5 mL via OROMUCOSAL

## 2015-08-10 MED ORDER — LACTATED RINGERS IV SOLN
INTRAVENOUS | Status: DC
Start: 2015-08-10 — End: 2015-08-10
  Administered 2015-08-10: 08:00:00 via INTRAVENOUS

## 2015-08-10 MED ORDER — FENTANYL CITRATE (PF) 100 MCG/2ML IJ SOLN: 25.0000 ug | INTRAMUSCULAR | Status: DC | PRN

## 2015-08-10 MED ORDER — MIDAZOLAM HCL 2 MG/2ML IJ SOLN
INTRAMUSCULAR | Status: AC
  Filled 2015-08-10: qty 2

## 2015-08-10 MED ORDER — LIDOCAINE VISCOUS 2 % MT SOLN
OROMUCOSAL | Status: AC
  Filled 2015-08-10: qty 15

## 2015-08-10 MED ORDER — ONDANSETRON HCL 4 MG/2ML IJ SOLN
INTRAMUSCULAR | Status: AC
  Filled 2015-08-10: qty 2

## 2015-08-10 MED ORDER — ONDANSETRON HCL 4 MG/2ML IJ SOLN: 4.0000 mg | Freq: Once | INTRAMUSCULAR | Status: DC | PRN

## 2015-08-10 MED ORDER — PROPOFOL 10 MG/ML IV BOLUS
INTRAVENOUS | Status: AC
  Filled 2015-08-10: qty 40

## 2015-08-10 MED ORDER — MIDAZOLAM HCL 5 MG/5ML IJ SOLN
INTRAMUSCULAR | Status: DC | PRN
  Administered 2015-08-10: 2 mg via INTRAVENOUS

## 2015-08-10 NOTE — Addendum Note (Signed)
Addendum  created 08/10/15 1011 by Charmaine Downs, CRNA   Modules edited: Clinical Notes   Clinical Notes:  File: [id number]

## 2015-08-10 NOTE — Interval H&P Note (Signed)
History and Physical Interval Note:  08/10/2015 8:35 AM  Adair Patter  has presented today for surgery, with the diagnosis of dyspepsia, dysphagia  The various methods of treatment have been discussed with the patient and family. After consideration of risks, benefits and other options for treatment, the patient has consented to  Procedure(s) with comments: ESOPHAGOGASTRODUODENOSCOPY (EGD) WITH PROPOFOL (N/A) - 0830 MALONEY DILATION (N/A) as a surgical intervention .  The patient's history has been reviewed, patient examined, no change in status, stable for surgery.  I have reviewed the patient's chart and labs.  Questions were answered to the patient's satisfaction.     Doyle Tegethoff  No change; EGD/ED .  The risks, benefits, limitations, alternatives and imponderables have been reviewed with the patient. Potential for esophageal dilation, biopsy, etc. have also been reviewed.  Questions have been answered. All parties agreeable.

## 2015-08-10 NOTE — Discharge Instructions (Signed)
EGD Discharge instructions Please read the instructions outlined below and refer to this sheet in the next few weeks. These discharge instructions provide you with general information on caring for yourself after you leave the hospital. Your doctor may also give you specific instructions. While your treatment has been planned according to the most current medical practices available, unavoidable complications occasionally occur. If you have any problems or questions after discharge, please call your doctor. ACTIVITY  You may resume your regular activity but move at a slower pace for the next 24 hours.   Take frequent rest periods for the next 24 hours.   Walking will help expel (get rid of) the air and reduce the bloated feeling in your abdomen.   No driving for 24 hours (because of the anesthesia (medicine) used during the test).   You may shower.   Do not sign any important legal documents or operate any machinery for 24 hours (because of the anesthesia used during the test).  NUTRITION  Drink plenty of fluids.   You may resume your normal diet.   Begin with a light meal and progress to your normal diet.   Avoid alcoholic beverages for 24 hours or as instructed by your caregiver.  MEDICATIONS  You may resume your normal medications unless your caregiver tells you otherwise.  WHAT YOU CAN EXPECT TODAY  You may experience abdominal discomfort such as a feeling of fullness or gas pains.  FOLLOW-UP  Your doctor will discuss the results of your test with you.  SEEK IMMEDIATE MEDICAL ATTENTION IF ANY OF THE FOLLOWING OCCUR:  Excessive nausea (feeling sick to your stomach) and/or vomiting.   Severe abdominal pain and distention (swelling).   Trouble swallowing.   Temperature over 101 F (37.8 C).   Rectal bleeding or vomiting of blood.    Continue Protonix 40 mg daily  Follow-up with Dr. Rosendo Gros

## 2015-08-10 NOTE — Transfer of Care (Signed)
Immediate Anesthesia Transfer of Care Note  Patient: [name]  Procedure(s) Performed: Procedure(s) with comments: ESOPHAGOGASTRODUODENOSCOPY (EGD) WITH PROPOFOL (N/A) - 0830 MALONEY DILATION (N/A)  Patient Location: PACU  Anesthesia Type:MAC  Level of Consciousness: awake and patient cooperative  Airway & Oxygen Therapy: Patient Spontanous Breathing and Patient connected to face mask oxygen  Post-op Assessment: Report given to RN, Post -op Vital signs reviewed and stable and Patient moving all extremities  Post vital signs: Reviewed and stable  Last Vitals:  Filed Vitals:   08/10/15 0800 08/10/15 0805  BP: 137/89 137/88  Pulse:    Temp:    Resp: 18 14    Complications: No apparent anesthesia complications

## 2015-08-10 NOTE — H&P (View-Only) (Signed)
Referring Provider: Raiford Simmonds., PA-C Primary Care Physician:  Raiford Simmonds., PA-C  Primary GI: Dr. Gala Romney   Chief Complaint  Patient presents with  . Follow-up    HPI:   [name] is a 60 y.o. 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 EGD in May 2015 with non-critical Schatzki's ring, not manipulated, healed ulcer, gastric bx negative. At last visit in early March, she had noted "knotting" of her stomach when leaning back. Worsened with movement. Sore to the touch. CT abd/pelvis performed as I was concerned about an abdominal hernia as culprit. She ended up going to the ED for a CT scan due to bothersome symptoms. This noted a small type 1 hiatal hernia, average size 5 mm pancreatic head cystic lesion unchanged from 2014 and considered benign, and small umbilical hernia containing adipose tissue. She was seen by Dr. [name] with [facility] to evaluate for umbilical hernia repair. His consultation notes were reviewed, and stated he had reviewed the CT and would like patient to undergo an EGD to confirm presence of hiatal hernia and evaluate for any ulcerative disease. Per his notes, she was having dysphagia, which was not reported to me. She weighed in the 130s in 2015, now in the low 150s.   I reviewed CT with Dr. Zigmund Daniel. 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 States hamburger or chicken causes food to hang up in lower chest. Placed on Carafate and tramadol by ED. Carafate seemed to help alleviate some pain. Stomach starts bloating when eating. Associated abdominal discomfort. Stays very nauseated. No NSAIDs or aspirin powders. No vomiting.   Linzess only used as prn. Doesn't feel constipated. Prilosec once daily. Doesn't seem to be really helping much. States she develops a big knot that pokes out in upper abdomen and has to take her hands and rub it down.   Past Medical History  Diagnosis Date  . Hiatal hernia   .  Palpitations   . Anxiety   . Hypertension   . Hypercholesterolemia   . Depression   . Chest pain     "STRESS"; had normal nuclear stress test 02/2011; denied recent history 02/10/14  . Arthritis   . GERD (gastroesophageal reflux disease)   . History of stomach ulcers   . Difficulty sleeping     mind racing  . Bipolar 1 disorder ([place])   . Back pain, chronic   . Heart murmur     childhood    Past Surgical History  Procedure Laterality Date  . Appendectomy    . Carpal tunnel release      BILATERAL  . Colonoscopy, esophagogastroduodenoscopy (egd) and esophageal dilation N/A 02/06/2013    RMR. Benign colon polyp, diverticulosis, hemorrhoids. EGD with Schatzki's ring s/p 18 F dilation, 85mm elliptical prepyloric antral ulcer with adjacent scarring, negative path  . Partial knee arthroplasty Left 07/15/2013    Procedure: UNICOMPARTMENTAL LEFT KNEE MEDIAL ;  Surgeon: [name], MD;  Location: [facility] ORS;  Service: Orthopedics;  Laterality: Left;  . Esophagogastroduodenoscopy N/A 09/23/2013    Dr. Gala Romney: non-critical Schatzki's ring, not manipulated, healed ulcer, gastric bx negative  . Lumbar fusion  02/12/2014    LEVEL 1 L4 L5    DR BROOKS  . Back surgery      Current Outpatient Prescriptions  Medication Sig Dispense Refill  . clonazePAM (KLONOPIN) 0.5 MG tablet Take 1 mg by mouth 2 (two) times daily as needed for  anxiety.    . Linaclotide (LINZESS) 290 MCG CAPS capsule Take 1 capsule (290 mcg total) by mouth daily. (Patient taking differently: Take 290 mcg by mouth daily as needed (constipation). ) 90 capsule 3  . lisinopril (PRINIVIL,ZESTRIL) 20 MG tablet Take 20 mg by mouth every morning.     . Multiple Vitamin (MULTIVITAMIN WITH MINERALS) TABS tablet Take 1 tablet by mouth daily.    Marland Kitchen omeprazole (PRILOSEC) 20 MG capsule TAKE ONE CAPSULE BY MOUTH ONE TIME DAILY 30 capsule 5  . QUEtiapine (SEROQUEL) 100 MG tablet Take 100 mg by mouth at bedtime.    . simvastatin (ZOCOR) 20 MG tablet  Take 20 mg by mouth daily.    . sucralfate (CARAFATE) 1 GM/10ML suspension Take 10 mLs (1 g total) by mouth 4 (four) times daily -  with meals and at bedtime. 420 mL 0  . traMADol (ULTRAM) 50 MG tablet Take 1 tablet (50 mg total) by mouth every 6 (six) hours as needed. 15 tablet 0  . ondansetron (ZOFRAN) 4 MG tablet Take 1 tablet (4 mg total) by mouth 3 (three) times daily before meals. 90 tablet 3  . pantoprazole (PROTONIX) 40 MG tablet Take 1 tablet (40 mg total) by mouth daily. Take 30 minutes before breakfast daily. 90 tablet 3   No current facility-administered medications for this visit.    Allergies as of 07/27/2015 - Review Complete 07/27/2015  Allergen Reaction Noted  . Morphine and related Nausea Only 01/24/2013    Family History  Problem Relation Age of Onset  . Heart attack Mother   . Colon cancer Neg Hx     Social History   Social History  . Marital Status: Single    Spouse Name: N/A  . Number of Children: 2  . Years of Education: N/A   Social History Main Topics  . Smoking status: Never Smoker   . Smokeless tobacco: Never Used     Comment: Never smoked  . Alcohol Use: No  . Drug Use: No  . Sexual Activity: No   Other Topics Concern  . None   Social History Narrative    Review of Systems: Negative unless mentioned in HPI.   Physical Exam: BP 148/95 mmHg  Pulse 109  Temp(Src) 98.3 F (36.8 C) (Oral)  Ht 5\' 3"  (1.6 m)  Wt 151 lb 12.8 oz (68.856 kg)  BMI 26.90 kg/m2 General:   Alert and oriented. No distress noted. Pleasant and cooperative.  Head:  Normocephalic and atraumatic. Eyes:  Conjuctiva clear without scleral icterus. Mouth:  Oral mucosa pink and moist. Good dentition. No lesions. Heart:  S1, S2 present without murmurs, rubs, or gallops. Regular rate and rhythm. Abdomen:  +BS, soft, TTP upper abdomen, small umbilical hernia.  Msk:  Symmetrical without gross deformities. Normal posture. Extremities:  Without edema. Neurologic:  Alert and   oriented x4;  grossly normal neurologically. Skin:  Intact without significant lesions or rashes. Psych:  Alert and cooperative. Normal mood and affect.

## 2015-08-10 NOTE — Anesthesia Preprocedure Evaluation (Addendum)
Anesthesia Evaluation  Patient identified by MRN, date of birth, ID band Patient awake    Reviewed: Allergy & Precautions, H&P , NPO status , Patient's Chart, lab work & pertinent test results, reviewed documented beta blocker date and time   History of Anesthesia Complications Negative for: history of anesthetic complications  Airway Mallampati: III  TM Distance: >3 FB Neck ROM: Full    Dental  (+) Partial Upper, Partial Lower,    Pulmonary neg pulmonary ROS,    breath sounds clear to auscultation       Cardiovascular hypertension, Pt. on medications  Rhythm:Regular     Neuro/Psych neg Seizures PSYCHIATRIC DISORDERS Anxiety Depression Bipolar Disorder Back pain  Neuromuscular disease    GI/Hepatic Neg liver ROS, hiatal hernia, PUD, GERD  Medicated and Controlled,  Endo/Other  negative endocrine ROS  Renal/GU negative Renal ROS     Musculoskeletal  (+) Arthritis ,   Abdominal   Peds  Hematology negative hematology ROS (+)   Anesthesia Other Findings   Reproductive/Obstetrics                            Anesthesia Physical Anesthesia Plan  ASA: II  Anesthesia Plan: MAC   Post-op Pain Management:    Induction: Intravenous  Airway Management Planned: Simple Face Mask  Additional Equipment:   Intra-op Plan:   Post-operative Plan:   Informed Consent: I have reviewed the patients History and Physical, chart, labs and discussed the procedure including the risks, benefits and alternatives for the proposed anesthesia with the patient or authorized representative who has indicated his/her understanding and acceptance.     Plan Discussed with:   Anesthesia Plan Comments:        Anesthesia Quick Evaluation

## 2015-08-10 NOTE — Anesthesia Postprocedure Evaluation (Signed)
Anesthesia Post Note  Patient: [name]  Procedure(s) Performed: Procedure(s) (LRB): ESOPHAGOGASTRODUODENOSCOPY (EGD) WITH PROPOFOL (N/A) MALONEY DILATION (N/A)  Patient location during evaluation: PACU Anesthesia Type: MAC Level of consciousness: awake and patient cooperative Pain management: pain level controlled Vital Signs Assessment: post-procedure vital signs reviewed and stable Respiratory status: nonlabored ventilation and respiratory function stable Cardiovascular status: blood pressure returned to baseline Postop Assessment: no signs of nausea or vomiting Anesthetic complications: no    Last Vitals:  Filed Vitals:   08/10/15 0800 08/10/15 0805  BP: 137/89 137/88  Pulse:    Temp:    Resp: 18 14    Last Pain:  Filed Vitals:   08/10/15 0808  PainSc: 7                  Hedwig Mcfall J

## 2015-08-10 NOTE — Op Note (Signed)
[facility] Patient Name: [name] Procedure Date: 08/10/2015 8:41 AM MRN: [id number] Date of Birth: February 11, 1956 Attending MD: Norvel Richards , MD CSN: [id number] Age: 60 Admit Type: Outpatient Procedure:                Upper GI endoscopy Indications:              Dyspepsia Providers:                Norvel Richards, MD, Renda Rolls, RN, Georgeann Oppenheim, Technician Referring MD:             [facility], MD (Referring                            MD), Gaynelle Arabian Rama (Referring MD) Medicines:                Monitored Anesthesia Care, Propofol per Anesthesia Complications:            No immediate complications. Estimated Blood Loss:     Estimated blood loss was minimal. Procedure:                Pre-Anesthesia Assessment:                           - Prior to the procedure, a History and Physical                            was performed, and patient medications and                            allergies were reviewed. The patient's tolerance of                            previous anesthesia was also reviewed. The risks                            and benefits of the procedure and the sedation                            options and risks were discussed with the patient.                            All questions were answered, and informed consent                            was obtained. Prior Anticoagulants: The patient has                            taken no previous anticoagulant or antiplatelet                            agents. ASA Grade Assessment: II - A patient with  mild systemic disease. After reviewing the risks                            and benefits, the patient was deemed in                            satisfactory condition to undergo the procedure.                           After obtaining informed consent, the endoscope was                            passed under direct vision. Throughout  the                            procedure, the patient's blood pressure, pulse, and                            oxygen saturations were monitored continuously. The                            EG-[id number]) scope was introduced through the                            mouth, and advanced to the second part of duodenum.                            The upper GI endoscopy was accomplished without                            difficulty. The patient tolerated the procedure                            well. The upper GI endoscopy was accomplished                            without difficulty. The patient tolerated the                            procedure well. Scope In: 8:48:27 AM Scope Out: 8:55:05 AM Total Procedure Duration: 0 hours 6 minutes 38 seconds  Findings:      The examined esophagus was normal. The scope was withdrawn. Dilation was       performed with a Maloney dilator with mild resistance at 68 Fr. Dilation       was performed again with a Maloney dilator with no resistance at 56 Fr.       The dilation site was examined following endoscope reinsertion and       showed no change. Superficial tear noted through the upper esophageal       sphincter mucosa only.      A medium-sized hiatal hernia was present.      The exam of the stomach was otherwise normal.      The exam of the duodenum was otherwise normal.      The second portion of the  duodenum was normal. Above dilation was       carried out after diagnostic EGD performed. Impression:               - Normal esophagus. Dilated.                           - Medium-sized hiatal hernia.                           - Normal second portion of the duodenum.                           - No specimens collected. Moderate Sedation:      OTHER Recommendation:           - Patient has a contact number available for                            emergencies. The signs and symptoms of potential                            delayed complications were  discussed with the                            patient. Return to normal activities tomorrow.                            Written discharge instructions were provided to the                            patient.                           - Advance diet as tolerated today.                           - Continue present medications. Continue Protonix                            40 mg daily. Follow-up with Dr. Rosendo Gros. Procedure Code(s):        --- Professional ---                           [phone], Esophagogastroduodenoscopy, flexible,                            transoral; diagnostic, including collection of                            specimen(s) by brushing or washing, when performed                            (separate procedure)                           [id number], Dilation of esophagus, by unguided sound or  bougie, single or multiple passes Diagnosis Code(s):        --- Professional ---                           K44.9, Diaphragmatic hernia without obstruction or                            gangrene                           R10.13, Epigastric pain CPT copyright 2016 [employer]. All rights reserved. The codes documented in this report are preliminary and upon coder review may  be revised to meet current compliance requirements. Cristopher Estimable. Yossi Hinchman, MD Norvel Richards, MD 08/10/2015 9:09:48 AM This report has been signed electronically. Number of Addenda: 0

## 2015-08-12 ENCOUNTER — Encounter (HOSPITAL_COMMUNITY): Payer: Self-pay | Admitting: Internal Medicine

## 2015-09-04 ENCOUNTER — Encounter (HOSPITAL_COMMUNITY)
Admission: RE | Disposition: A | Discharge status: Home / Self Care | Payer: Self-pay | Source: Ambulatory Visit | Attending: Gastroenterology

## 2015-09-04 ENCOUNTER — Ambulatory Visit (HOSPITAL_COMMUNITY)
Admission: RE | Admit: 2015-09-04 | Discharge: 2015-09-04 | Disposition: A | Discharge status: Home / Self Care | Payer: Medicaid Other | Source: Ambulatory Visit | Attending: Gastroenterology | Admitting: Gastroenterology

## 2015-09-04 DIAGNOSIS — R1310 Dysphagia, unspecified: Secondary | ICD-10-CM | POA: Insufficient documentation

## 2015-09-04 DIAGNOSIS — K449 Diaphragmatic hernia without obstruction or gangrene: Secondary | ICD-10-CM | POA: Diagnosis not present

## 2015-09-04 HISTORY — PX: ESOPHAGEAL MANOMETRY: SHX5429

## 2015-09-04 SURGERY — MANOMETRY, ESOPHAGUS

## 2015-09-04 MED ORDER — LIDOCAINE VISCOUS 2 % MT SOLN
OROMUCOSAL | Status: AC
  Filled 2015-09-04: qty 15

## 2015-09-04 SURGICAL SUPPLY — 2 items
FACESHIELD LNG OPTICON STERILE (SAFETY) IMPLANT
GLOVE BIO SURGEON STRL SZ8 (GLOVE) ×6 IMPLANT

## 2015-09-04 NOTE — Progress Notes (Signed)
Esophageal manometry done per protocol.  Patient tolerated well.

## 2015-09-05 ENCOUNTER — Encounter (HOSPITAL_COMMUNITY): Payer: Self-pay | Admitting: Gastroenterology

## 2015-09-07 ENCOUNTER — Ambulatory Visit: Payer: Self-pay | Admitting: General Surgery

## 2015-09-07 NOTE — H&P (Signed)
History of Present Illness Nancy Ok MD; 09/07/2015 12:10 PM) The patient is a 60 year old female who presents with a hiatal hernia. 60 year old female with a moderate-sized hernia. Patient recently underwent a manometry study. Manometry test revealed normal peristalsis.  Patient continues with dysphagia.   Allergies Elbert Ewings, CMA; 09/07/2015 11:50 AM) Morphine Sulfate *ANALGESICS - OPIOID*  Medication History Elbert Ewings, CMA; 09/07/2015 11:50 AM) KlonoPIN (0.5MG  Tablet, Oral) Active. Linzess (290MCG Capsule, Oral) Active. Lisinopril (20MG  Tablet, Oral) Active. Multi Vitamin (Oral) Active. Omeprazole (20MG  Capsule ER, Oral) Active. Zocor (20MG  Tablet, Oral) Active. Carafate (1GM Tablet, Oral) Active. SEROquel (100MG  Tablet, Oral) Active. Medications Reconciled    Review of Systems Nancy Ok, MD; 09/07/2015 12:12 PM) General Present- Appetite Loss, Chills and Fatigue. Not Present- Fever, Night Sweats, Weight Gain and Weight Loss. Skin Present- Dryness. Not Present- Change in Wart/Mole, Hives, Jaundice, New Lesions, Non-Healing Wounds, Rash and Ulcer. HEENT Present- Oral Ulcers. Not Present- Earache, Hearing Loss, Hoarseness, Nose Bleed, Ringing in the Ears, Seasonal Allergies, Sinus Pain, Sore Throat, Visual Disturbances, Wears glasses/contact lenses and Yellow Eyes. Respiratory Not Present- Bloody sputum, Chronic Cough, Difficulty Breathing, Snoring and Wheezing. Breast Not Present- Breast Mass, Breast Pain, Nipple Discharge and Skin Changes. Cardiovascular Not Present- Chest Pain, Difficulty Breathing Lying Down, Leg Cramps, Palpitations, Rapid Heart Rate, Shortness of Breath and Swelling of Extremities. Gastrointestinal Present- Abdominal Pain, Bloating, Change in Bowel Habits, Gets full quickly at meals and Nausea. Not Present- Bloody Stool, Chronic diarrhea, Constipation, Difficulty Swallowing, Excessive gas, Hemorrhoids, Indigestion, Rectal Pain and  Vomiting. Female Genitourinary Not Present- Frequency, Nocturia, Painful Urination, Pelvic Pain and Urgency. Musculoskeletal Present- Back Pain, Joint Pain and Muscle Weakness. Not Present- Joint Stiffness, Muscle Pain and Swelling of Extremities. Neurological Present- Headaches and Weakness. Not Present- Decreased Memory, Fainting, Numbness, Seizures, Tingling, Tremor and Trouble walking. Psychiatric Present- Anxiety, Bipolar, Depression and Frequent crying. Not Present- Change in Sleep Pattern and Fearful. Endocrine Not Present- Cold Intolerance, Excessive Hunger, Hair Changes, Heat Intolerance, Hot flashes and New Diabetes. Hematology Not Present- Easy Bruising, Excessive bleeding, Gland problems, HIV and Persistent Infections.  Vitals Elbert Ewings CMA; 09/07/2015 11:50 AM) 09/07/2015 11:50 AM Weight: 152.4 lb Height: 62in Body Surface Area: 1.7 m Body Mass Index: 27.87 kg/m  Temp.: 97.22F  Pulse: 86 (Regular)  BP: 138/88 (Sitting, Left Arm, Standard)       Physical Exam Nancy Ok, MD; 09/07/2015 12:12 PM) General Mental Status-Alert. General Appearance-Consistent with stated age. Hydration-Well hydrated. Voice-Normal.  Head and Neck Head-normocephalic, atraumatic with no lesions or palpable masses. Trachea-midline. Thyroid Gland Characteristics - normal size and consistency.  Chest and Lung Exam Chest and lung exam reveals -quiet, even and easy respiratory effort with no use of accessory muscles and on auscultation, normal breath sounds, no adventitious sounds and normal vocal resonance. Inspection Chest Wall - Normal. Back - normal.  Cardiovascular Cardiovascular examination reveals -normal heart sounds, regular rate and rhythm with no murmurs and normal pedal pulses bilaterally.  Abdomen Note: No large palpable ventral hernia.   Neurologic Neurologic evaluation reveals -alert and oriented x 3 with no impairment of recent or  remote memory. Mental Status-Normal.    Assessment & Plan Nancy Ok MD; 09/07/2015 12:11 PM) HIATAL HERNIA WITH GERD (K21.9) Impression: 60 year old female with type I hiatal hernia.  1. We will proceed to the operating room for robotic hiatal hernia repair with mesh and Nissen fundoplication. 2. I did discuss with the patient the risks and benefits of the procedure to include but  not limited to: Infection, bleeding, damage to structures particularly the esophagus, possible pneumothorax, possible recurrence. The patient was understanding and wished to proceed.

## 2015-09-29 ENCOUNTER — Encounter (HOSPITAL_COMMUNITY)
Admission: RE | Admit: 2015-09-29 | Discharge: 2015-09-29 | Disposition: A | Discharge status: Home / Self Care | Payer: Medicaid Other | Source: Ambulatory Visit | Attending: General Surgery | Admitting: General Surgery

## 2015-09-29 ENCOUNTER — Encounter (HOSPITAL_COMMUNITY): Payer: Self-pay

## 2015-09-29 DIAGNOSIS — K449 Diaphragmatic hernia without obstruction or gangrene: Secondary | ICD-10-CM | POA: Diagnosis not present

## 2015-09-29 DIAGNOSIS — Z01812 Encounter for preprocedural laboratory examination: Secondary | ICD-10-CM | POA: Diagnosis present

## 2015-09-29 LAB — BASIC METABOLIC PANEL
Anion gap: 7 (ref 5–15)
BUN: 19 mg/dL (ref 6–20)
CALCIUM: 9.5 mg/dL (ref 8.9–10.3)
CO2: 27 mmol/L (ref 22–32)
CREATININE: 0.98 mg/dL (ref 0.44–1.00)
Chloride: 105 mmol/L (ref 101–111)
GFR calc non Af Amer: >60 mL/min (ref >60)
GLUCOSE: 87 mg/dL (ref 65–99)
Potassium: 4 mmol/L (ref 3.5–5.1)
Sodium: 139 mmol/L (ref 135–145)

## 2015-09-29 LAB — CBC
HCT: 38.2 % (ref 36.0–46.0)
Hemoglobin: 12.6 g/dL (ref 12.0–15.0)
MCH: 29.2 pg (ref 26.0–34.0)
MCHC: 33 g/dL (ref 30.0–36.0)
MCV: 88.4 fL (ref 78.0–100.0)
PLATELETS: 221 10*3/uL (ref 150–400)
RBC: 4.32 MIL/uL (ref 3.87–5.11)
RDW: 13.6 % (ref 11.5–15.5)
WBC: 6.4 10*3/uL (ref 4.0–10.5)

## 2015-09-29 NOTE — Pre-Procedure Instructions (Signed)
EKG 08-06-15 / CT abd/pelvis 3'17 Epic.

## 2015-09-29 NOTE — Patient Instructions (Addendum)
[name]  09/29/2015   Your procedure is scheduled on: 10-02-15  Report to [facility]  Entrance take [facility]  elevators to 3rd floor to  [place] at  [place] [facility] AM.  Call this number if you have problems the morning of surgery [phone]   Remember: ONLY 1 PERSON MAY GO WITH YOU TO SHORT STAY TO GET  READY MORNING OF [name].  Do not eat food or drink liquids :After Midnight.     Take these medicines the morning of surgery with A SIP OF WATER: Clonazepam. Pantoprazole.Simvastatin. Tylenol -if need. DO NOT TAKE ANY DIABETIC MEDICATIONS DAY OF YOUR SURGERY                               You may not have any metal on your body including hair pins and              piercings  Do not wear jewelry, make-up, lotions, powders or perfumes, deodorant             Do not wear nail polish.  Do not shave  48 hours prior to surgery.              Men may shave face and neck.   Do not bring valuables to the [facility]. [name].  Contacts, dentures or bridgework may not be worn into surgery.  Leave suitcase in the car. After surgery it may be brought to your room.     Patients discharged the day of surgery will not be allowed to drive home.  Name and phone number of your driver: [name], son- [phone] cell  Special Instructions: N/A              Please read over the following fact sheets you were given: _____________________________________________________________________             [name] - Preparing for Surgery Before surgery, you can play an important role.  Because skin is not sterile, your skin needs to be as free of germs as possible.  You can reduce the number of germs on your skin by washing with CHG (chlorahexidine gluconate) soap before surgery.  CHG is an antiseptic cleaner which kills germs and bonds with the skin to continue killing germs even after washing. Please DO NOT use if you  have an allergy to CHG or antibacterial soaps.  If your skin becomes reddened/irritated stop using the CHG and inform your nurse when you arrive at Short Stay. Do not shave (including legs and underarms) for at least 48 hours prior to the first CHG shower.  You may shave your face/neck. Please follow these instructions carefully:  1.  Shower with CHG Soap the night before surgery and the  morning of Surgery.  2.  If you choose to wash your hair, wash your hair first as usual with your  normal  shampoo.  3.  After you shampoo, rinse your hair and body thoroughly to remove the  shampoo.                           4.  Use CHG as you would any other liquid soap.  You can apply  chg directly  to the skin and wash                       Gently with a scrungie or clean washcloth.  5.  Apply the CHG Soap to your body ONLY FROM THE NECK DOWN.   Do not use on face/ open                           Wound or open sores. Avoid contact with eyes, ears mouth and genitals (private parts).                       Wash face,  Genitals (private parts) with your normal soap.             6.  Wash thoroughly, paying special attention to the area where your surgery  will be performed.  7.  Thoroughly rinse your body with warm water from the neck down.  8.  DO NOT shower/wash with your normal soap after using and rinsing off  the CHG Soap.                9.  Pat yourself dry with a clean towel.            10.  Wear clean pajamas.            11.  Place clean sheets on your bed the night of your first shower and do not  sleep with pets. Day of Surgery : Do not apply any lotions/deodorants the morning of surgery.  Please wear clean clothes to the Yang/surgery center.  FAILURE TO FOLLOW THESE INSTRUCTIONS MAY RESULT IN THE CANCELLATION OF YOUR SURGERY PATIENT SIGNATURE_________________________________  NURSE  SIGNATURE__________________________________  ________________________________________________________________________

## 2015-10-02 ENCOUNTER — Encounter (HOSPITAL_COMMUNITY): Payer: Self-pay | Admitting: *Deleted

## 2015-10-02 ENCOUNTER — Ambulatory Visit (HOSPITAL_COMMUNITY): Payer: Medicaid Other | Admitting: Anesthesiology

## 2015-10-02 ENCOUNTER — Encounter (HOSPITAL_COMMUNITY)
Admission: RE | Disposition: A | Discharge status: Home / Self Care | Payer: Self-pay | Source: Ambulatory Visit | Attending: General Surgery

## 2015-10-02 ENCOUNTER — Inpatient Hospital Stay (HOSPITAL_COMMUNITY)
Admission: RE | Admit: 2015-10-02 | Discharge: 2015-10-05 | DRG: 328 | Disposition: A | Discharge status: Home / Self Care | Payer: Medicaid Other | Source: Ambulatory Visit | Attending: General Surgery | Admitting: General Surgery

## 2015-10-02 DIAGNOSIS — G8929 Other chronic pain: Secondary | ICD-10-CM | POA: Diagnosis present

## 2015-10-02 DIAGNOSIS — Z86010 Personal history of colonic polyps: Secondary | ICD-10-CM | POA: Diagnosis not present

## 2015-10-02 DIAGNOSIS — F319 Bipolar disorder, unspecified: Secondary | ICD-10-CM | POA: Diagnosis present

## 2015-10-02 DIAGNOSIS — Z9889 Other specified postprocedural states: Secondary | ICD-10-CM

## 2015-10-02 DIAGNOSIS — K567 Ileus, unspecified: Secondary | ICD-10-CM

## 2015-10-02 DIAGNOSIS — M549 Dorsalgia, unspecified: Secondary | ICD-10-CM | POA: Diagnosis present

## 2015-10-02 DIAGNOSIS — Z8711 Personal history of peptic ulcer disease: Secondary | ICD-10-CM

## 2015-10-02 DIAGNOSIS — E7800 Pure hypercholesterolemia, unspecified: Secondary | ICD-10-CM | POA: Diagnosis present

## 2015-10-02 DIAGNOSIS — K219 Gastro-esophageal reflux disease without esophagitis: Secondary | ICD-10-CM | POA: Diagnosis present

## 2015-10-02 DIAGNOSIS — R509 Fever, unspecified: Secondary | ICD-10-CM

## 2015-10-02 DIAGNOSIS — Z8249 Family history of ischemic heart disease and other diseases of the circulatory system: Secondary | ICD-10-CM | POA: Diagnosis not present

## 2015-10-02 DIAGNOSIS — F419 Anxiety disorder, unspecified: Secondary | ICD-10-CM | POA: Diagnosis present

## 2015-10-02 DIAGNOSIS — I10 Essential (primary) hypertension: Secondary | ICD-10-CM | POA: Diagnosis present

## 2015-10-02 DIAGNOSIS — K449 Diaphragmatic hernia without obstruction or gangrene: Principal | ICD-10-CM | POA: Diagnosis present

## 2015-10-02 DIAGNOSIS — Z885 Allergy status to narcotic agent status: Secondary | ICD-10-CM

## 2015-10-02 DIAGNOSIS — R0602 Shortness of breath: Secondary | ICD-10-CM

## 2015-10-02 DIAGNOSIS — J9811 Atelectasis: Secondary | ICD-10-CM

## 2015-10-02 DIAGNOSIS — Z79899 Other long term (current) drug therapy: Secondary | ICD-10-CM | POA: Diagnosis not present

## 2015-10-02 HISTORY — PX: INSERTION OF MESH: SHX5868

## 2015-10-02 LAB — TYPE AND SCREEN
ABO/RH(D): O POS
Antibody Screen: NEGATIVE

## 2015-10-02 SURGERY — FUNDOPLICATION, NISSEN, ROBOT-ASSISTED, LAPAROSCOPIC
Anesthesia: General

## 2015-10-02 MED ORDER — MIDAZOLAM HCL 2 MG/2ML IJ SOLN
INTRAMUSCULAR | Status: AC
  Filled 2015-10-02: qty 2

## 2015-10-02 MED ORDER — PROCHLORPERAZINE EDISYLATE 5 MG/ML IJ SOLN: 10.0000 mg | Freq: Once | INTRAMUSCULAR | Status: DC | PRN

## 2015-10-02 MED ORDER — BUPIVACAINE-EPINEPHRINE (PF) 0.25% -1:200000 IJ SOLN
INTRAMUSCULAR | Status: AC
  Filled 2015-10-02: qty 30

## 2015-10-02 MED ORDER — CHLORHEXIDINE GLUCONATE 4 % EX LIQD: 1.0000 "application " | Freq: Once | CUTANEOUS | Status: DC

## 2015-10-02 MED ORDER — ONDANSETRON HCL 4 MG/2ML IJ SOLN: 4.0000 mg | Freq: Four times a day (QID) | INTRAMUSCULAR | Status: DC | PRN

## 2015-10-02 MED ORDER — DEXAMETHASONE SODIUM PHOSPHATE 10 MG/ML IJ SOLN
INTRAMUSCULAR | Status: AC
  Filled 2015-10-02: qty 1

## 2015-10-02 MED ORDER — ROCURONIUM BROMIDE 100 MG/10ML IV SOLN
INTRAVENOUS | Status: DC | PRN
  Administered 2015-10-02 (×2): 10 mg via INTRAVENOUS
  Administered 2015-10-02: 40 mg via INTRAVENOUS

## 2015-10-02 MED ORDER — DEXAMETHASONE SODIUM PHOSPHATE 10 MG/ML IJ SOLN
INTRAMUSCULAR | Status: DC | PRN
  Administered 2015-10-02: 10 mg via INTRAVENOUS

## 2015-10-02 MED ORDER — SUGAMMADEX SODIUM 200 MG/2ML IV SOLN
INTRAVENOUS | Status: DC | PRN
  Administered 2015-10-02: 140 mg via INTRAVENOUS

## 2015-10-02 MED ORDER — LIDOCAINE HCL (CARDIAC) 20 MG/ML IV SOLN
INTRAVENOUS | Status: DC | PRN
  Administered 2015-10-02: 50 mg via INTRAVENOUS

## 2015-10-02 MED ORDER — LACTATED RINGERS IV SOLN
INTRAVENOUS | Status: DC | PRN
  Administered 2015-10-02 (×2): via INTRAVENOUS

## 2015-10-02 MED ORDER — DEXTROSE-NACL 5-0.9 % IV SOLN
INTRAVENOUS | Status: DC
  Administered 2015-10-02: 16:00:00 via INTRAVENOUS
  Administered 2015-10-03: 1000 mL via INTRAVENOUS
  Administered 2015-10-03 – 2015-10-04 (×3): via INTRAVENOUS

## 2015-10-02 MED ORDER — ACETAMINOPHEN 10 MG/ML IV SOLN
INTRAVENOUS | Status: AC
  Filled 2015-10-02: qty 100

## 2015-10-02 MED ORDER — ACETAMINOPHEN 10 MG/ML IV SOLN
1000.0000 mg | Freq: Four times a day (QID) | INTRAVENOUS | Status: AC
  Administered 2015-10-02 (×3): 1000 mg via INTRAVENOUS
  Filled 2015-10-02 (×3): qty 100

## 2015-10-02 MED ORDER — MIDAZOLAM HCL 5 MG/5ML IJ SOLN
INTRAMUSCULAR | Status: DC | PRN
  Administered 2015-10-02: 2 mg via INTRAVENOUS

## 2015-10-02 MED ORDER — PROPOFOL 10 MG/ML IV BOLUS
INTRAVENOUS | Status: DC | PRN
  Administered 2015-10-02: 150 mg via INTRAVENOUS

## 2015-10-02 MED ORDER — HYDRALAZINE HCL 20 MG/ML IJ SOLN: 10.0000 mg | INTRAMUSCULAR | Status: DC | PRN

## 2015-10-02 MED ORDER — SUGAMMADEX SODIUM 200 MG/2ML IV SOLN
INTRAVENOUS | Status: AC
  Filled 2015-10-02: qty 2

## 2015-10-02 MED ORDER — CEFAZOLIN SODIUM-DEXTROSE 2-4 GM/100ML-% IV SOLN
2.0000 g | INTRAVENOUS | Status: AC
  Administered 2015-10-02: 2 g via INTRAVENOUS
  Filled 2015-10-02: qty 100

## 2015-10-02 MED ORDER — FENTANYL CITRATE (PF) 100 MCG/2ML IJ SOLN
INTRAMUSCULAR | Status: DC | PRN
  Administered 2015-10-02 (×4): 50 ug via INTRAVENOUS
  Administered 2015-10-02: 100 ug via INTRAVENOUS
  Administered 2015-10-02: 50 ug via INTRAVENOUS

## 2015-10-02 MED ORDER — PROPOFOL 10 MG/ML IV BOLUS
INTRAVENOUS | Status: AC
  Filled 2015-10-02: qty 20

## 2015-10-02 MED ORDER — FENTANYL CITRATE (PF) 250 MCG/5ML IJ SOLN
INTRAMUSCULAR | Status: AC
  Filled 2015-10-02: qty 5

## 2015-10-02 MED ORDER — ENOXAPARIN SODIUM 40 MG/0.4ML ~~LOC~~ SOLN
40.0000 mg | SUBCUTANEOUS | Status: DC
  Administered 2015-10-02 – 2015-10-04 (×3): 40 mg via SUBCUTANEOUS
  Filled 2015-10-02 (×4): qty 0.4

## 2015-10-02 MED ORDER — HYDROMORPHONE HCL 1 MG/ML IJ SOLN
1.0000 mg | INTRAMUSCULAR | Status: DC | PRN
  Administered 2015-10-02 – 2015-10-04 (×8): 1 mg via INTRAVENOUS
  Filled 2015-10-02 (×9): qty 1

## 2015-10-02 MED ORDER — CEFAZOLIN SODIUM-DEXTROSE 2-4 GM/100ML-% IV SOLN
INTRAVENOUS | Status: AC
Start: 2015-10-02 — End: 2015-10-02
  Filled 2015-10-02: qty 100

## 2015-10-02 MED ORDER — LORAZEPAM 2 MG/ML IJ SOLN
0.2500 mg | Freq: Four times a day (QID) | INTRAMUSCULAR | Status: DC | PRN
Start: 2015-10-02 — End: 2015-10-04
  Administered 2015-10-02: 0.25 mg via INTRAVENOUS
  Filled 2015-10-02: qty 1

## 2015-10-02 MED ORDER — ONDANSETRON HCL 4 MG/2ML IJ SOLN
INTRAMUSCULAR | Status: DC | PRN
  Administered 2015-10-02: 4 mg via INTRAVENOUS

## 2015-10-02 MED ORDER — HYDROCODONE-ACETAMINOPHEN 7.5-325 MG/15ML PO SOLN: 10.0000 mL | Freq: Four times a day (QID) | ORAL | Status: DC | PRN

## 2015-10-02 MED ORDER — TISSEEL VH 10 ML EX KIT
PACK | CUTANEOUS | Status: AC
  Filled 2015-10-02: qty 1

## 2015-10-02 MED ORDER — ONDANSETRON HCL 4 MG/2ML IJ SOLN
INTRAMUSCULAR | Status: AC
  Filled 2015-10-02: qty 2

## 2015-10-02 MED ORDER — CETYLPYRIDINIUM CHLORIDE 0.05 % MT LIQD
7.0000 mL | Freq: Two times a day (BID) | OROMUCOSAL | Status: DC
  Administered 2015-10-02 – 2015-10-05 (×5): 7 mL via OROMUCOSAL

## 2015-10-02 MED ORDER — BUPIVACAINE-EPINEPHRINE 0.25% -1:200000 IJ SOLN
INTRAMUSCULAR | Status: DC | PRN
  Administered 2015-10-02: 16 mL

## 2015-10-02 MED ORDER — METOCLOPRAMIDE HCL 5 MG/ML IJ SOLN
10.0000 mg | Freq: Four times a day (QID) | INTRAMUSCULAR | Status: DC
  Administered 2015-10-02 – 2015-10-05 (×11): 10 mg via INTRAVENOUS
  Filled 2015-10-02 (×16): qty 2

## 2015-10-02 MED ORDER — LACTATED RINGERS IR SOLN
Status: DC | PRN
  Administered 2015-10-02: 1000 mL

## 2015-10-02 MED ORDER — HYDROMORPHONE HCL 1 MG/ML IJ SOLN
INTRAMUSCULAR | Status: AC
  Filled 2015-10-02: qty 1

## 2015-10-02 MED ORDER — HYDROMORPHONE HCL 1 MG/ML IJ SOLN
0.2500 mg | INTRAMUSCULAR | Status: DC | PRN
  Administered 2015-10-02: 0.5 mg via INTRAVENOUS
  Administered 2015-10-02 (×2): 0.25 mg via INTRAVENOUS

## 2015-10-02 MED ORDER — SUCCINYLCHOLINE CHLORIDE 20 MG/ML IJ SOLN
INTRAMUSCULAR | Status: DC | PRN
  Administered 2015-10-02: 100 mg via INTRAVENOUS

## 2015-10-02 MED ORDER — 0.9 % SODIUM CHLORIDE (POUR BTL) OPTIME
TOPICAL | Status: DC | PRN
  Administered 2015-10-02: 2000 mL

## 2015-10-02 MED ORDER — LIDOCAINE HCL (CARDIAC) 20 MG/ML IV SOLN
INTRAVENOUS | Status: AC
  Filled 2015-10-02: qty 5

## 2015-10-02 MED ORDER — ROCURONIUM BROMIDE 100 MG/10ML IV SOLN
INTRAVENOUS | Status: AC
  Filled 2015-10-02: qty 1

## 2015-10-02 MED ORDER — ONDANSETRON 4 MG PO TBDP: 4.0000 mg | ORAL_TABLET | Freq: Four times a day (QID) | ORAL | Status: DC | PRN

## 2015-10-02 MED ORDER — STERILE WATER FOR IRRIGATION IR SOLN
Status: DC | PRN
Start: 2015-10-02 — End: 2015-10-02
  Administered 2015-10-02: 1000 mL

## 2015-10-02 SURGICAL SUPPLY — 65 items
APPLIER CLIP 5 13 M/L LIGAMAX5 (MISCELLANEOUS)
APPLIER CLIP ROT 10 11.4 M/L (STAPLE)
BENZOIN TINCTURE PRP APPL 2/3 (GAUZE/BANDAGES/DRESSINGS) IMPLANT
BLADE SURG SZ11 CARB STEEL (BLADE) ×3 IMPLANT
CHLORAPREP W/TINT 26ML (MISCELLANEOUS) ×3 IMPLANT
CLIP APPLIE 5 13 M/L LIGAMAX5 (MISCELLANEOUS) IMPLANT
CLIP APPLIE ROT 10 11.4 M/L (STAPLE) IMPLANT
CLIP LIGATING HEM O LOK PURPLE (MISCELLANEOUS) IMPLANT
CLIP LIGATING HEMO O LOK GREEN (MISCELLANEOUS) IMPLANT
CLOSURE WOUND 1/2 X4 (GAUZE/BANDAGES/DRESSINGS)
COVER TIP SHEARS 8 DVNC (MISCELLANEOUS) ×1 IMPLANT
COVER TIP SHEARS 8MM DA VINCI (MISCELLANEOUS) ×2
DECANTER SPIKE VIAL GLASS SM (MISCELLANEOUS) ×3 IMPLANT
DEVICE TROCAR PUNCTURE CLOSURE (ENDOMECHANICALS) IMPLANT
DRAIN PENROSE 18X1/2 LTX STRL (DRAIN) ×3 IMPLANT
DRAPE ARM DVNC X/XI (DISPOSABLE) ×4 IMPLANT
DRAPE COLUMN DVNC XI (DISPOSABLE) ×1 IMPLANT
DRAPE DA VINCI XI ARM (DISPOSABLE) ×8
DRAPE DA VINCI XI COLUMN (DISPOSABLE) ×2
DRAPE LG THREE QUARTER DISP (DRAPES) IMPLANT
DRAPE WARM FLUID 44X44 (DRAPE) ×3 IMPLANT
ELECT REM PT RETURN 9FT ADLT (ELECTROSURGICAL) ×3
ELECTRODE REM PT RTRN 9FT ADLT (ELECTROSURGICAL) ×1 IMPLANT
ENDOLOOP SUT PDS II  0 18 (SUTURE)
ENDOLOOP SUT PDS II 0 18 (SUTURE) IMPLANT
GAUZE SPONGE 2X2 8PLY STRL LF (GAUZE/BANDAGES/DRESSINGS) IMPLANT
GAUZE SPONGE 4X4 16PLY XRAY LF (GAUZE/BANDAGES/DRESSINGS) ×3 IMPLANT
GLOVE BIO SURGEON STRL SZ7.5 (GLOVE) ×6 IMPLANT
GLOVE BIOGEL PI IND STRL 6.5 (GLOVE) ×2 IMPLANT
GLOVE BIOGEL PI IND STRL 7.0 (GLOVE) ×1 IMPLANT
GLOVE BIOGEL PI INDICATOR 6.5 (GLOVE) ×4
GLOVE BIOGEL PI INDICATOR 7.0 (GLOVE) ×2
GLOVE SURG SS PI 7.0 STRL IVOR (GLOVE) ×3 IMPLANT
GOWN STRL REUS W/TWL XL LVL3 (GOWN DISPOSABLE) ×9 IMPLANT
KIT BASIN OR (CUSTOM PROCEDURE TRAY) ×3 IMPLANT
LIQUID BAND (GAUZE/BANDAGES/DRESSINGS) IMPLANT
MARKER SKIN DUAL TIP RULER LAB (MISCELLANEOUS) ×3 IMPLANT
MESH HERNIA 7X10 (Mesh General) ×3 IMPLANT
NEEDLE HYPO 22GX1.5 SAFETY (NEEDLE) ×3 IMPLANT
NEEDLE INSUFFLATION 14GA 120MM (NEEDLE) ×3 IMPLANT
PACK CARDIOVASCULAR III (CUSTOM PROCEDURE TRAY) ×3 IMPLANT
PAD POSITIONING PINK XL (MISCELLANEOUS) ×3 IMPLANT
SCISSORS LAP 5X35 DISP (ENDOMECHANICALS) ×3 IMPLANT
SEAL CANN UNIV 5-8 DVNC XI (MISCELLANEOUS) ×4 IMPLANT
SEAL XI 5MM-8MM UNIVERSAL (MISCELLANEOUS) ×8
SEALER VESSEL DA VINCI XI (MISCELLANEOUS) ×2
SEALER VESSEL EXT DVNC XI (MISCELLANEOUS) ×1 IMPLANT
SET BI-LUMEN FLTR TB AIRSEAL (TUBING) ×3 IMPLANT
SLEEVE XCEL OPT CAN 5 100 (ENDOMECHANICALS) IMPLANT
SOLUTION ANTI FOG 6CC (MISCELLANEOUS) ×3 IMPLANT
SOLUTION ELECTROLUBE (MISCELLANEOUS) ×3 IMPLANT
SPONGE GAUZE 2X2 STER 10/PKG (GAUZE/BANDAGES/DRESSINGS)
SPONGE LAP 18X18 X RAY DECT (DISPOSABLE) IMPLANT
STAPLER VISISTAT 35W (STAPLE) IMPLANT
STRIP CLOSURE SKIN 1/2X4 (GAUZE/BANDAGES/DRESSINGS) IMPLANT
SUT MNCRL AB 4-0 PS2 18 (SUTURE) ×3 IMPLANT
SUT SILK 2 0 SH (SUTURE) ×15 IMPLANT
SYR 20CC LL (SYRINGE) ×3 IMPLANT
SYRINGE 10CC LL (SYRINGE) ×3 IMPLANT
TOWEL OR 17X26 10 PK STRL BLUE (TOWEL DISPOSABLE) ×3 IMPLANT
TOWEL OR NON WOVEN STRL DISP B (DISPOSABLE) ×3 IMPLANT
TRAY FOLEY W/METER SILVER 14FR (SET/KITS/TRAYS/PACK) ×3 IMPLANT
TRAY FOLEY W/METER SILVER 16FR (SET/KITS/TRAYS/PACK) IMPLANT
TROCAR ADV FIXATION 5X100MM (TROCAR) ×3 IMPLANT
TROCAR BLADELESS OPT 5 100 (ENDOMECHANICALS) ×3 IMPLANT

## 2015-10-02 NOTE — Op Note (Signed)
10/02/2015   PATIENT:  [name]  60 y.o. female  PRE-OPERATIVE DIAGNOSIS:  Hiatal hernia   POST-OPERATIVE DIAGNOSIS:  Type I Hiatal Hernia  PROCEDURE:  Procedure(s): XI ROBOTIC ASSISTED HIATAL HERNIA REPAIR WITH MESH  AND LAPAROSCOPIC NISSEN FUNDOPLICATION (N/A) INSERTION OF MESH (N/A)  SURGEON:  Surgeon(s) and Role:    * [name], MD - Primary  ASSISTANTS: Dr. Gurney Maxin   ANESTHESIA:   local and general  EBL:   15cc  BLOOD ADMINISTERED:none  DRAINS: none   LOCAL MEDICATIONS USED:  BUPIVICAINE   SPECIMEN:  No Specimen  DISPOSITION OF SPECIMEN:  N/A  COUNTS:  YES  TOURNIQUET:  * No tourniquets in log *  DICTATION: .Dragon Dictation The patient was taken back to the operating room and placed in the supine position with bilateral SCDs in place. The patient was prepped and draped in the usual sterile fashion. After appropriate antibiotics were confirmed a timeout was called and all facts were verified.   A Veress needle technique was used to insufflate the abdomen to 15 mm of mercury the paramedian stab incision. Subsequent to this an 8 mm trocar was introduced as was a 8 millimeter camera. At this time the subsequent robotic trochars x3, were then placed adjacent to this trocar approximately 8-10 cm away. Each trocar was inserted under direct visualization, there were total of 4 trochars. The assistant trocar was then placed in the right lower quadrant under direct visualization. The Nathanson retractor was then visualized inserted into the abdomen and the incision just to the left of the falciform ligament. This was then placed to retract the liver appropriately. At this time the patient was positioned in reverse Trendelenburg.   At this time the robot patient cart was brought to the bedside and placed in good position and the arms were docked to the trochars appropriately. At this time I proceeded to incised the gastrohepatic ligament.  At this time I  proceeded to mobilize the stomach inferiorly and visualize the right crus. The peritoneum over the right crus was incised and right crus was identified. I proceeded to dissect this inferiorly until the left crus was seen joining the right crus. Once the right crus was adequately dissected we turned our to the left crus which was dissected away. This required traction of the stomach to the right side. Once this was visualized we then proceeded to circumferentially dissect the esophagus away from the surrounding tissue. At this time a Penrose drain was placed around the esophagus to help with retraction. At this time the phrenoesophageal fat pad was dissected away from the esophagus. There was a small hiatal hernia seen. I mobilized the esophagus cephalad approximately for 3-4 cm, clearing away the surrounding tissue.   At this time we turned our attention to the greater curvature the stomach and the omentum was mobilized using the robotic vessel sealer. This was taken up to the greater curvature to the hiatus. This mobilized the entire greater curvature to allow mobilization and the wrap. I then proceeded to bring the greater curvature the stomach posterior to the esophagus, and a shoeshine technique was used to evaluate the mobilization of the greater curvature.   At this time I proceeded to close the hiatus using 2 interrupted 2-0 silk stitches in interrupted fashion. This brought together the hiatal closure without undue stricture to the esophagus.   A piece of Gore Bio-A mesh was placed over the hiatal closure and sutured to the crus using 2-0 silk sutures  x 3.  At this time the greater curvature was brought around the esophagus and sutured using 2-0 silk sutures interrupted fashion approximately 1 cm apart x3. The middle suture was sutured to the esophagus. A left collar stitch was then used to gastropexy the stomach from the wrap to the diaphragm just lateral to the left crus as.  A second collar stitch  was placed from the wrap to the right crus. The wrap lay at approximately 11:00 on its own with undue tension.  The wrap lay loose with no strangulation of the esophagus.  At this time the robot was undocked. The liver trocar was removed. At this time insufflation was evacuated. Skin was reapproximated for Monocryl subcuticular fashion. The skin was then dressed with Dermabond. The patient tolerated the procedure well and was taken to the recovery room in stable condition.    PLAN OF CARE: Admit to inpatient   PATIENT DISPOSITION:  PACU - hemodynamically stable.   Delay start of Pharmacological VTE agent (>24hrs) due to surgical blood loss or risk of bleeding: not applicable

## 2015-10-02 NOTE — Transfer of Care (Signed)
Immediate Anesthesia Transfer of Care Note  Patient: [name]  Procedure(s) Performed: Procedure(s): XI ROBOTIC ASSISTED HIATAL HERNIA REPAIR WITH MESH  AND LAPAROSCOPIC NISSEN FUNDOPLICATION (N/A) INSERTION OF MESH (N/A)  Patient Location: PACU  Anesthesia Type:General  Level of Consciousness:  sedated, patient cooperative and responds to stimulation  Airway & Oxygen Therapy:Patient Spontanous Breathing and Patient connected to face mask oxgen  Post-op Assessment:  Report given to PACU RN and Post -op Vital signs reviewed and stable  Post vital signs:  Reviewed and stable  Last Vitals:  Filed Vitals:   10/02/15 0533  BP: 120/82  Pulse: 84  Temp: 37 C  Resp: 16    Complications: No apparent anesthesia complications

## 2015-10-02 NOTE — Progress Notes (Signed)
Dr. Deirdre Priest called to clarify Dilaudid IV order for pain control considering patient's allergy (nausea) to Morphine and drugs of the like; advised ok to give Dilaudid and/or Fentanyl.

## 2015-10-02 NOTE — Interval H&P Note (Signed)
History and Physical Interval Note:  10/02/2015 7:01 AM  [name]  has presented today for surgery, with the diagnosis of Hiatal hernia   The various methods of treatment have been discussed with the patient and family. After consideration of risks, benefits and other options for treatment, the patient has consented to  Procedure(s): XI ROBOTIC [place] FUNDOPLICATION (N/A) INSERTION OF MESH (N/A) as a surgical intervention .  The patient's history has been reviewed, patient examined, no change in status, stable for surgery.  I have reviewed the patient's chart and labs.  Questions were answered to the patient's satisfaction.     Rosario Jacks., Anne Hahn

## 2015-10-02 NOTE — H&P (View-Only) (Signed)
History of Present Illness Nancy Ok MD; 09/07/2015 12:10 PM) The patient is a 60 year old female who presents with a hiatal hernia. 60 year old female with a moderate-sized hernia. Patient recently underwent a manometry study. Manometry test revealed normal peristalsis.  Patient continues with dysphagia.   Allergies Elbert Ewings, CMA; 09/07/2015 11:50 AM) Morphine Sulfate *ANALGESICS - OPIOID*  Medication History Elbert Ewings, CMA; 09/07/2015 11:50 AM) KlonoPIN (0.5MG  Tablet, Oral) Active. Linzess (290MCG Capsule, Oral) Active. Lisinopril (20MG  Tablet, Oral) Active. Multi Vitamin (Oral) Active. Omeprazole (20MG  Capsule ER, Oral) Active. Zocor (20MG  Tablet, Oral) Active. Carafate (1GM Tablet, Oral) Active. SEROquel (100MG  Tablet, Oral) Active. Medications Reconciled    Review of Systems Nancy Ok, MD; 09/07/2015 12:12 PM) General Present- Appetite Loss, Chills and Fatigue. Not Present- Fever, Night Sweats, Weight Gain and Weight Loss. Skin Present- Dryness. Not Present- Change in Wart/Mole, Hives, Jaundice, New Lesions, Non-Healing Wounds, Rash and Ulcer. HEENT Present- Oral Ulcers. Not Present- Earache, Hearing Loss, Hoarseness, Nose Bleed, Ringing in the Ears, Seasonal Allergies, Sinus Pain, Sore Throat, Visual Disturbances, Wears glasses/contact lenses and Yellow Eyes. Respiratory Not Present- Bloody sputum, Chronic Cough, Difficulty Breathing, Snoring and Wheezing. Breast Not Present- Breast Mass, Breast Pain, Nipple Discharge and Skin Changes. Cardiovascular Not Present- Chest Pain, Difficulty Breathing Lying Down, Leg Cramps, Palpitations, Rapid Heart Rate, Shortness of Breath and Swelling of Extremities. Gastrointestinal Present- Abdominal Pain, Bloating, Change in Bowel Habits, Gets full quickly at meals and Nausea. Not Present- Bloody Stool, Chronic diarrhea, Constipation, Difficulty Swallowing, Excessive gas, Hemorrhoids, Indigestion, Rectal Pain and  Vomiting. Female Genitourinary Not Present- Frequency, Nocturia, Painful Urination, Pelvic Pain and Urgency. Musculoskeletal Present- Back Pain, Joint Pain and Muscle Weakness. Not Present- Joint Stiffness, Muscle Pain and Swelling of Extremities. Neurological Present- Headaches and Weakness. Not Present- Decreased Memory, Fainting, Numbness, Seizures, Tingling, Tremor and Trouble walking. Psychiatric Present- Anxiety, Bipolar, Depression and Frequent crying. Not Present- Change in Sleep Pattern and Fearful. Endocrine Not Present- Cold Intolerance, Excessive Hunger, Hair Changes, Heat Intolerance, Hot flashes and New Diabetes. Hematology Not Present- Easy Bruising, Excessive bleeding, Gland problems, HIV and Persistent Infections.  Vitals Elbert Ewings CMA; 09/07/2015 11:50 AM) 09/07/2015 11:50 AM Weight: 152.4 lb Height: 62in Body Surface Area: 1.7 m Body Mass Index: 27.87 kg/m  Temp.: 97.22F  Pulse: 86 (Regular)  BP: 138/88 (Sitting, Left Arm, Standard)       Physical Exam Nancy Ok, MD; 09/07/2015 12:12 PM) General Mental Status-Alert. General Appearance-Consistent with stated age. Hydration-Well hydrated. Voice-Normal.  Head and Neck Head-normocephalic, atraumatic with no lesions or palpable masses. Trachea-midline. Thyroid Gland Characteristics - normal size and consistency.  Chest and Lung Exam Chest and lung exam reveals -quiet, even and easy respiratory effort with no use of accessory muscles and on auscultation, normal breath sounds, no adventitious sounds and normal vocal resonance. Inspection Chest Wall - Normal. Back - normal.  Cardiovascular Cardiovascular examination reveals -normal heart sounds, regular rate and rhythm with no murmurs and normal pedal pulses bilaterally.  Abdomen Note: No large palpable ventral hernia.   Neurologic Neurologic evaluation reveals -alert and oriented x 3 with no impairment of recent or  remote memory. Mental Status-Normal.    Assessment & Plan Nancy Ok MD; 09/07/2015 12:11 PM) HIATAL HERNIA WITH GERD (K21.9) Impression: 60 year old female with type I hiatal hernia.  1. We will proceed to the operating room for robotic hiatal hernia repair with mesh and Nissen fundoplication. 2. I did discuss with the patient the risks and benefits of the procedure to include but  not limited to: Infection, bleeding, damage to structures particularly the esophagus, possible pneumothorax, possible recurrence. The patient was understanding and wished to proceed.

## 2015-10-02 NOTE — Plan of Care (Signed)
Problem: Food- and Nutrition-Related Knowledge Deficit (NB-1.1) Goal: Nutrition education Formal process to instruct or train a patient/client in a skill or to impart knowledge to help patients/clients voluntarily manage or modify food choices and eating behavior to maintain or improve health. Outcome: Completed/Met Date Met:  10/02/15 Nutrition Education Note  Received consult for diet education per hiatal hernia repair, nissen fundoplication. Provided [facility] Nissen Fundoplication diet education handout.  Discussed 2 week post op diet with pt. Emphasized that liquids must be non carbonated, non caffeinated, and sugar free.   Diet: First 2 Weeks   Clear Liquids:  Water or Sugar-free flavored water (i.e. Fruit H2O, Propel)  Decaffeinated coffee or tea (sugar-free)  Crystal Lite, Wyler's Lite, Minute Maid Lite  Sugar-free Jell-O  Bouillon or broth  Sugar-free Popsicle: *Less than 20 calories each; Limit 1 per day   Full Liquids:  Protein Shakes/Drinks + 2 choices per day of other full liquids  Full liquids must be:  No More Than 12 grams of Carbs per serving  No More Than 3 grams of Fat per serving  Strained low-fat cream soup  Non-Fat milk  Fat-free Lactaid Milk  Sugar-free yogurt (Dannon Lite & Fit, Greek yogurt)   Expected compliance is fair. Current diet order is NPO Pt is currently consuming no meals.  Satira Anis. Advay Volante, MS, RD LDN Inpatient Clinical Dietitian Pager [phone]

## 2015-10-02 NOTE — Anesthesia Preprocedure Evaluation (Addendum)
Anesthesia Evaluation  Patient identified by MRN, date of birth, ID band Patient awake    Reviewed: Allergy & Precautions, NPO status , Patient's Chart, lab work & pertinent test results  Airway Mallampati: II  TM Distance: >3 FB Neck ROM: Full    Dental no notable dental hx.    Pulmonary neg pulmonary ROS,    Pulmonary exam normal breath sounds clear to auscultation       Cardiovascular hypertension, Pt. on medications Normal cardiovascular exam+ Valvular Problems/Murmurs  Rhythm:Regular Rate:Normal     Neuro/Psych PSYCHIATRIC DISORDERS Anxiety Depression Bipolar Disorder  Neuromuscular disease    GI/Hepatic Neg liver ROS, hiatal hernia, PUD, GERD  ,  Endo/Other  negative endocrine ROS  Renal/GU negative Renal ROS  negative genitourinary   Musculoskeletal  (+) Arthritis ,   Abdominal   Peds negative pediatric ROS (+)  Hematology  (+) anemia ,   Anesthesia Other Findings   Reproductive/Obstetrics negative OB ROS                             Anesthesia Physical Anesthesia Plan  ASA: II  Anesthesia Plan: General   Post-op Pain Management:    Induction: Intravenous  Airway Management Planned: Oral ETT  Additional Equipment:   Intra-op Plan:   Post-operative Plan: Extubation in OR  Informed Consent: I have reviewed the patients History and Physical, chart, labs and discussed the procedure including the risks, benefits and alternatives for the proposed anesthesia with the patient or authorized representative who has indicated his/her understanding and acceptance.   Dental advisory given  Plan Discussed with: CRNA  Anesthesia Plan Comments:         Anesthesia Quick Evaluation

## 2015-10-02 NOTE — Anesthesia Procedure Notes (Signed)
Procedure Name: Intubation Date/Time: 10/02/2015 7:26 AM Performed by: Anne Fu Pre-anesthesia Checklist: Patient identified, Emergency Drugs available, Suction available, Patient being monitored and Timeout performed Patient Re-evaluated:Patient Re-evaluated prior to inductionOxygen Delivery Method: Circle system utilized Preoxygenation: Pre-oxygenation with 100% oxygen Intubation Type: IV induction Ventilation: Mask ventilation without difficulty Laryngoscope Size: Mac and 4 Grade View: Grade I Tube type: Oral Tube size: 7.5 mm Number of attempts: 1 Airway Equipment and Method: Stylet Placement Confirmation: ETT inserted through vocal cords under direct vision,  positive ETCO2,  CO2 detector and breath sounds checked- equal and bilateral Secured at: 20 cm Tube secured with: Tape Dental Injury: Teeth and Oropharynx as per pre-operative assessment

## 2015-10-02 NOTE — Anesthesia Postprocedure Evaluation (Signed)
Anesthesia Post Note  Patient: [name]  Procedure(s) Performed: Procedure(s) (LRB): XI ROBOTIC ASSISTED HIATAL HERNIA REPAIR WITH MESH  AND LAPAROSCOPIC NISSEN FUNDOPLICATION (N/A) INSERTION OF MESH (N/A)  Patient location during evaluation: PACU Anesthesia Type: General Level of consciousness: awake and alert Pain management: pain level controlled Vital Signs Assessment: post-procedure vital signs reviewed and stable Respiratory status: spontaneous breathing, nonlabored ventilation, respiratory function stable and patient connected to nasal cannula oxygen Cardiovascular status: blood pressure returned to baseline and stable Postop Assessment: no signs of nausea or vomiting Anesthetic complications: no    Last Vitals:  Filed Vitals:   10/02/15 1250 10/02/15 1400  BP: 103/74 107/73  Pulse: 75 67  Temp: 36.7 C 36.4 C  Resp: 10 16    Last Pain:  Filed Vitals:   10/02/15 1411  PainSc: Asleep                 Ravin Bendall J

## 2015-10-02 NOTE — Discharge Instructions (Signed)
EATING AFTER YOUR ESOPHAGEAL SURGERY (Stomach Fundoplication, Hiatal Hernia repair, Achalasia surgery, etc)  After your esophageal surgery, expect some sticking with swallowing over the next 1-2 months.    If food sticks when you eat, it is called "dysphagia".  This is due to swelling around your esophagus at the wrap & hiatal diaphragm repair.  It will gradually ease off over the next few months.  To help you through this temporary phase, we start you out on a pureed (blenderized) diet.  Your first meal in the hospital was thin liquids.  You should have been given a pureed diet by the time you left the hospital.  We ask patients to stay on a pureed diet for the first 2-3 weeks to avoid anything getting "stuck" near your recent surgery.  Don't be alarmed if your ability to swallow doesn't progress according to this plan.  Everyone is different and some diets can advance more or less quickly.     Some BASIC RULES to follow are:  Maintain an upright position whenever eating or drinking.  Take small bites - just a teaspoon size bite at a time.  Eat slowly.  It may also help to eat only one food at a time.  Consider nibbling through smaller, more frequent meals & avoid the urge to eat BIG meals  Do not push through feelings of fullness, nausea, or bloatedness  Do not mix solid foods and liquids in the same mouthful  Try not to "wash foods down" with large gulps of liquids.  Avoid carbonated (bubbly/fizzy) drinks.    Avoid foods that make you feel gassy or bloated.  Start with bland foods first.  Wait on trying greasy, fried, or spicy meals until you are tolerating more bland solids well.  Understand that it will be hard to burp and belch at first.  This gradually improves with time.  Expect to be more gassy/flatulent/bloated initially.  Walking will help your body manage it better.  Consider using medications for bloating that contain simethicone such as  Maalox or Gas-X   Eat in a  relaxed atmosphere & minimize distractions.  Avoid talking while eating.    Do not use straws.  Following each meal, sit in an upright position (90 degree angle) for 60 to 90 minutes.  Going for a short walk can help as well  If food does stick, don't panic.  Try to relax and let the food pass on its own.  Sipping WARM LIQUID such as strong hot black tea can also help slide it down.   Be gradual in changes & use common sense:  -If you easily tolerating a certain "level" of foods, advance to the next level gradually -If you are having trouble swallowing a particular food, then avoid it.   -If food is sticking when you advance your diet, go back to thinner previous diet (the lower LEVEL) for 1-2 days.  LEVEL 1 = PUREED DIET  Do for the first 2 WEEKS AFTER SURGERY  -Foods in this group are pureed or blenderized to a smooth, mashed potato-like consistency.  -If necessary, the pureed foods can keep their shape with the addition of a thickening agent.   -Meat should be pureed to a smooth, pasty consistency.  Hot broth or gravy may be added to the pureed meat, approximately 1 oz. of liquid per 3 oz. serving of meat. -CAUTION:  If any foods do not puree into a smooth consistency, swallowing will be more difficult.  (For example, nuts  or seeds sometimes do not blend well.)  Hot Foods Cold Foods  Pureed scrambled eggs and cheese Pureed cottage cheese  Baby cereals Thickened juices and nectars  Thinned cooked cereals (no lumps) Thickened milk or eggnog  Pureed [place] toast or pancakes Ensure  Mashed potatoes Ice cream  Pureed parsley, au gratin, scalloped potatoes, candied sweet potatoes Fruit or [place] ice, sherbet  Pureed buttered or alfredo noodles Plain yogurt  Pureed vegetables (no corn or peas) Instant breakfast  Pureed soups and creamed soups Smooth pudding, mousse, custard  Pureed scalloped apples Whipped gelatin  Gravies Sugar, syrup, honey, jelly  Sauces, cheese, tomato,  barbecue, white, creamed Cream  Any baby food Creamer  Alcohol in moderation (not beer or champagne) Margarine  Coffee or tea Mayonnaise   Ketchup, mustard   Apple sauce   SAMPLE MENU:  PUREED DIET Breakfast Lunch Dinner   Orange juice, 1/2 cup  Cream of wheat, 1/2 cup  Pineapple juice, 1/2 cup  Pureed [place], barley soup, 3/4 cup  Pureed Hawaiian chicken, 3 oz   Scrambled eggs, mashed or blended with cheese, 1/2 cup  Tea or coffee, 1 cup   Whole milk, 1 cup   Non-dairy creamer, 2 Tbsp.  Mashed potatoes, 1/2 cup  Pureed cooled broccoli, 1/2 cup  Apple sauce, 1/2 cup  Coffee or tea  Mashed potatoes, 1/2 cup  Pureed spinach, 1/2 cup  Frozen yogurt, 1/2 cup  Tea or coffee      LEVEL 2 = SOFT DIET  After your first 2 weeks, you can advance to a soft diet.   Keep on this diet until everything goes down easily.  Hot Foods Cold Foods  White fish Cottage cheese  Stuffed fish Junior baby fruit  Baby food meals Semi thickened juices  Minced soft cooked, scrambled, poached eggs nectars  Souffle & omelets Ripe mashed bananas  Cooked cereals Canned fruit, pineapple sauce, milk  potatoes Milkshake  Buttered or Alfredo noodles Custard  Cooked cooled vegetable Puddings, including tapioca  Sherbet Yogurt  Vegetable soup or alphabet soup Fruit ice, [place] ice  Gravies Whipped gelatin  Sugar, syrup, honey, jelly Junior baby desserts  Sauces:  Cheese, creamed, barbecue, tomato, white Cream  Coffee or tea Margarine   SAMPLE MENU:  LEVEL 2 Breakfast Lunch Dinner   Orange juice, 1/2 cup  Oatmeal, 1/2 cup  Scrambled eggs with cheese, 1/2 cup  Decaffeinated tea, 1 cup  Whole milk, 1 cup  Non-dairy creamer, 2 Tbsp  Pineapple juice, 1/2 cup  Minced beef, 3 oz  Gravy, 2 Tbsp  Mashed potatoes, 1/2 cup  Minced fresh broccoli, 1/2 cup  Applesauce, 1/2 cup  Coffee, 1 cup  [place], barley soup, 3/4 cup  Minced Hawaiian chicken, 3 oz  Mashed potatoes, 1/2  cup  Cooked spinach, 1/2 cup  Frozen yogurt, 1/2 cup  Non-dairy creamer, 2 Tbsp      LEVEL 3 = CHOPPED DIET  -After all the foods in level 2 (soft diet) are passing through well you should advance up to more chopped foods.  -It is still important to cut these foods into small pieces and eat slowly.  Hot Foods Cold Foods  Poultry Cottage cheese  Chopped Swedish meatballs Yogurt  Meat salads (ground or flaked meat) Milk  Flaked fish (tuna) Milkshakes  Poached or scrambled eggs Soft, cold, dry cereal  Souffles and omelets Fruit juices or nectars  Cooked cereals Chopped canned fruit  Chopped [place] toast or pancakes Canned fruit cocktail  Noodles or  pasta (no rice) Pudding, mousse, custard  Cooked vegetables (no frozen peas, corn, or mixed vegetables) Green salad  Canned small sweet peas Ice cream  Creamed soup or vegetable soup Fruit ice, [place] ice  Pureed vegetable soup or alphabet soup Non-dairy creamer  Ground scalloped apples Margarine  Gravies Mayonnaise  Sauces:  Cheese, creamed, barbecue, tomato, white Ketchup  Coffee or tea Mustard   SAMPLE MENU:  LEVEL 3 Breakfast Lunch Dinner   Orange juice, 1/2 cup  Oatmeal, 1/2 cup  Scrambled eggs with cheese, 1/2 cup  Decaffeinated tea, 1 cup  Whole milk, 1 cup  Non-dairy creamer, 2 Tbsp  Ketchup, 1 Tbsp  Margarine, 1 tsp  Salt, 1/4 tsp  Sugar, 2 tsp  Pineapple juice, 1/2 cup  Ground beef, 3 oz  Gravy, 2 Tbsp  Mashed potatoes, 1/2 cup  Cooked spinach, 1/2 cup  Applesauce, 1/2 cup  Decaffeinated coffee  Whole milk  Non-dairy creamer, 2 Tbsp  Margarine, 1 tsp  Salt, 1/4 tsp  Pureed [place], barley soup, 3/4 cup  Barbecue chicken, 3 oz  Mashed potatoes, 1/2 cup  Ground fresh broccoli, 1/2 cup  Frozen yogurt, 1/2 cup  Decaffeinated tea, 1 cup  Non-dairy creamer, 2 Tbsp  Margarine, 1 tsp  Salt, 1/4 tsp  Sugar, 1 tsp    LEVEL 4:  REGULAR FOODS  -Foods in this group are soft,  moist, regularly textured foods.   -This level includes meat and breads, which tend to be the hardest things to swallow.   -Eat very slowly, chew well and continue to avoid carbonated drinks. -most people are at this level in 4-6 weeks  Hot Foods Cold Foods  Baked fish or skinned Soft cheeses - cottage cheese  Souffles and omelets Cream cheese  Eggs Yogurt  Stuffed shells Milk  Spaghetti with meat sauce Milkshakes  Cooked cereal Cold dry cereals (no nuts, dried fruit, coconut)  [place] toast or pancakes Crackers  Buttered toast Fruit juices or nectars  Noodles or pasta (no rice) Canned fruit  Potatoes (all types) Ripe bananas  Soft, cooked vegetables (no corn, lima, or baked beans) Peeled, ripe, fresh fruit  Creamed soups or vegetable soup Cakes (no nuts, dried fruit, coconut)  Canned chicken noodle soup Plain doughnuts  Gravies Ice cream  Bacon dressing Pudding, mousse, custard  Sauces:  Cheese, creamed, barbecue, tomato, white Fruit ice, [place] ice, sherbet  Decaffeinated tea or coffee Whipped gelatin  Pork chops Regular gelatin   Canned fruited gelatin molds   Sugar, syrup, honey, jam, jelly   Cream   Non-dairy   Margarine   Oil   Mayonnaise   Ketchup   Mustard   TROUBLESHOOTING IRREGULAR BOWELS  1) Avoid extremes of bowel movements (no bad constipation/diarrhea)  2) Miralax 17gm mixed in 8oz. water or juice-daily. May use BID as needed.  3) Gas-x,Phazyme, etc. as needed for gas & bloating.  4) Soft,bland diet. No spicy,greasy,fried foods.  5) Prilosec over-the-counter as needed  6) May hold gluten/wheat products from diet to see if symptoms improve.  7) May try probiotics (Align, Activa, etc) to help calm the bowels down  7) If symptoms become worse call back immediately.    If you have any questions please call our office at [place]: [phone].

## 2015-10-03 ENCOUNTER — Inpatient Hospital Stay (HOSPITAL_COMMUNITY): Payer: Medicaid Other

## 2015-10-03 LAB — BASIC METABOLIC PANEL
Anion gap: 4 — ABNORMAL LOW (ref 5–15)
BUN: 11 mg/dL (ref 6–20)
CHLORIDE: 109 mmol/L (ref 101–111)
CO2: 26 mmol/L (ref 22–32)
CREATININE: 0.81 mg/dL (ref 0.44–1.00)
Calcium: 8.5 mg/dL — ABNORMAL LOW (ref 8.9–10.3)
GFR calc Af Amer: >60 mL/min (ref >60)
GFR calc non Af Amer: >60 mL/min (ref >60)
GLUCOSE: 126 mg/dL — AB (ref 65–99)
POTASSIUM: 3.6 mmol/L (ref 3.5–5.1)
Sodium: 139 mmol/L (ref 135–145)

## 2015-10-03 MED ORDER — DIATRIZOATE MEGLUMINE & SODIUM 66-10 % PO SOLN
30.0000 mL | Freq: Once | ORAL | Status: AC
  Administered 2015-10-03: 60 mL via ORAL
  Filled 2015-10-03: qty 30

## 2015-10-03 NOTE — Progress Notes (Signed)
Patient ID: [name], female   DOB: 11-21-1955, 60 y.o.   MRN: [id number] [facility] Surgery Progress Note:   1 Day Post-Op  Subjective: Mental status is clear.  Just returned from UGI which looks OK.  Start clears Objective: Vital signs in last 24 hours: Temp:  [97.4 F (36.3 C)-98.8 F (37.1 C)] 98.2 F (36.8 C) (05/27 0521) Pulse Rate:  [58-85] 85 (05/27 0521) Resp:  [8-18] 18 (05/27 0521) BP: (93-111)/(58-75) 94/70 mmHg (05/27 0521) SpO2:  [94 %-100 %] 94 % (05/27 0521)  Intake/Output from previous day: 05/26 0701 - 05/27 0700 In: 3511.7 [I.V.:3411.7; IV Piggyback:100] Out: 845 [Urine:825; Blood:20] Intake/Output this shift:    Physical Exam: Work of breathing is normal  Advance diet.  Doing well.    Lab Results:  Results for orders placed or performed during the hospital encounter of 10/02/15 (from the past 48 hour(s))  Basic metabolic panel     Status: Abnormal   Collection Time: 10/03/15  4:35 AM  Result Value Ref Range   Sodium 139 135 - 145 mmol/L   Potassium 3.6 3.5 - 5.1 mmol/L   Chloride 109 101 - 111 mmol/L   CO2 26 22 - 32 mmol/L   Glucose, Bld 126 (H) 65 - 99 mg/dL   BUN 11 6 - 20 mg/dL   Creatinine, Ser 0.81 0.44 - 1.00 mg/dL   Calcium 8.5 (L) 8.9 - 10.3 mg/dL   GFR calc non Af Amer >60 >60 mL/min   GFR calc Af Amer >60 >60 mL/min    Comment: (NOTE) The eGFR has been calculated using the CKD EPI equation. This calculation has not been validated in all clinical situations. eGFR's persistently <60 mL/min signify possible Chronic Kidney Disease.    Anion gap 4 (L) 5 - 15    Radiology/Results: No results found.  Anti-infectives: Anti-infectives    Start     Dose/Rate Route Frequency Ordered Stop   10/02/15 0535  ceFAZolin (ANCEF) IVPB 2g/100 mL premix     2 g 200 mL/hr over 30 Minutes Intravenous On call to O.R. 10/02/15 0535 10/02/15 0739      Assessment/Plan: Problem List: Patient Active Problem List   Diagnosis Date Noted  . S/P  Nissen fundoplication (without gastrostomy tube) procedure 10/02/2015  . Hiatal hernia   . Dyspepsia 07/27/2015  . Dysphagia 07/27/2015  . Abdominal pain, epigastric 07/08/2015  . Back pain 02/12/2014  . Nausea alone 12/05/2013  . PUD (peptic ulcer disease) 09/05/2013  . Constipation 09/05/2013  . Expected blood loss anemia 07/16/2013  . Overweight (BMI 25.0-29.9) 07/16/2013  . 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 Abdominal pain, unspecified site 02/13/2013  . Rectal bleeding 02/05/2013  . Abdominal pain, other specified site 02/05/2013    Swallow ok.  Start clears  1 Day Post-Op    LOS: 1 day   Matt B. Hassell Done, MD, [facility] [phone] beeper [phone]  10/03/2015 11:10 AM

## 2015-10-03 NOTE — Progress Notes (Signed)
Nurse paged Dr. Hassell Done. Dr. Hassell Done aware of pts VS as follows: 101.1, 112, 18, 133/74, 95% 2LNC. Dr. Hassell Done instructed nurse to encourage patient to use incentive spirometer and deep breathing exercises. Dr. Hassell Done does not want tylenol ordered for patient at this time.

## 2015-10-04 ENCOUNTER — Inpatient Hospital Stay (HOSPITAL_COMMUNITY): Payer: Medicaid Other

## 2015-10-04 LAB — CBC WITH DIFFERENTIAL/PLATELET
BASOS PCT: 0 %
Basophils Absolute: 0 10*3/uL (ref 0.0–0.1)
EOS ABS: 0 10*3/uL (ref 0.0–0.7)
Eosinophils Relative: 1 %
HEMATOCRIT: 31.4 % — AB (ref 36.0–46.0)
HEMOGLOBIN: 10.1 g/dL — AB (ref 12.0–15.0)
LYMPHS ABS: 0.9 10*3/uL (ref 0.7–4.0)
Lymphocytes Relative: 15 %
MCH: 29.5 pg (ref 26.0–34.0)
MCHC: 32.2 g/dL (ref 30.0–36.0)
MCV: 91.8 fL (ref 78.0–100.0)
MONOS PCT: 8 %
Monocytes Absolute: 0.5 10*3/uL (ref 0.1–1.0)
NEUTROS ABS: 4.7 10*3/uL (ref 1.7–7.7)
NEUTROS PCT: 76 %
Platelets: 129 10*3/uL — ABNORMAL LOW (ref 150–400)
RBC: 3.42 MIL/uL — AB (ref 3.87–5.11)
RDW: 13.9 % (ref 11.5–15.5)
WBC: 6.1 10*3/uL (ref 4.0–10.5)

## 2015-10-04 MED ORDER — METOPROLOL TARTRATE 5 MG/5ML IV SOLN: 5.0000 mg | Freq: Four times a day (QID) | INTRAVENOUS | Status: DC | PRN

## 2015-10-04 MED ORDER — DIPHENHYDRAMINE HCL 50 MG/ML IJ SOLN: 12.5000 mg | Freq: Four times a day (QID) | INTRAMUSCULAR | Status: DC | PRN

## 2015-10-04 MED ORDER — DEXTROSE 5 % IV SOLN
1000.0000 mg | Freq: Four times a day (QID) | INTRAVENOUS | Status: DC | PRN
  Filled 2015-10-04: qty 10

## 2015-10-04 MED ORDER — HYDROCODONE-ACETAMINOPHEN 7.5-325 MG/15ML PO SOLN
10.0000 mL | ORAL | Status: DC | PRN
  Administered 2015-10-04: 15 mL via ORAL
  Filled 2015-10-04: qty 15

## 2015-10-04 MED ORDER — LIP MEDEX EX OINT
1.0000 "application " | TOPICAL_OINTMENT | Freq: Two times a day (BID) | CUTANEOUS | Status: DC
  Administered 2015-10-04 – 2015-10-05 (×3): 1 via TOPICAL
  Filled 2015-10-04: qty 7

## 2015-10-04 MED ORDER — BISACODYL 10 MG RE SUPP: 10.0000 mg | Freq: Every day | RECTAL | Status: DC

## 2015-10-04 MED ORDER — AMPHETAMINE-DEXTROAMPHETAMINE 20 MG PO TABS
20.0000 mg | ORAL_TABLET | Freq: Every day | ORAL | Status: DC
  Filled 2015-10-04: qty 1

## 2015-10-04 MED ORDER — PANTOPRAZOLE SODIUM 40 MG PO TBEC
40.0000 mg | DELAYED_RELEASE_TABLET | Freq: Two times a day (BID) | ORAL | Status: DC
  Administered 2015-10-04 – 2015-10-05 (×2): 40 mg via ORAL
  Filled 2015-10-04 (×4): qty 1

## 2015-10-04 MED ORDER — SIMVASTATIN 20 MG PO TABS
20.0000 mg | ORAL_TABLET | Freq: Every day | ORAL | Status: DC
  Administered 2015-10-04 – 2015-10-05 (×2): 20 mg via ORAL
  Filled 2015-10-04 (×2): qty 1

## 2015-10-04 MED ORDER — LINACLOTIDE 290 MCG PO CAPS
290.0000 ug | ORAL_CAPSULE | Freq: Every day | ORAL | Status: DC
  Filled 2015-10-04 (×2): qty 1

## 2015-10-04 MED ORDER — ALUM & MAG HYDROXIDE-SIMETH 200-200-20 MG/5ML PO SUSP: 30.0000 mL | Freq: Four times a day (QID) | ORAL | Status: DC | PRN

## 2015-10-04 MED ORDER — HYDROMORPHONE HCL 1 MG/ML IJ SOLN: 1.0000 mg | INTRAMUSCULAR | Status: DC | PRN

## 2015-10-04 MED ORDER — ALUM & MAG HYDROXIDE-SIMETH 200-200-20 MG/5ML PO SUSP
30.0000 mL | Freq: Four times a day (QID) | ORAL | Status: AC
  Administered 2015-10-04 (×3): 30 mL via ORAL
  Filled 2015-10-04 (×3): qty 30

## 2015-10-04 MED ORDER — PROCHLORPERAZINE EDISYLATE 5 MG/ML IJ SOLN: 5.0000 mg | INTRAMUSCULAR | Status: DC | PRN

## 2015-10-04 MED ORDER — LORAZEPAM 2 MG/ML IJ SOLN: 0.2500 mg | Freq: Four times a day (QID) | INTRAMUSCULAR | Status: DC | PRN

## 2015-10-04 MED ORDER — ACETAMINOPHEN 325 MG PO TABS
325.0000 mg | ORAL_TABLET | Freq: Four times a day (QID) | ORAL | Status: DC | PRN
  Administered 2015-10-04: 650 mg via ORAL
  Filled 2015-10-04: qty 2

## 2015-10-04 MED ORDER — QUETIAPINE FUMARATE 100 MG PO TABS
100.0000 mg | ORAL_TABLET | Freq: Every day | ORAL | Status: DC
  Administered 2015-10-04: 100 mg via ORAL
  Filled 2015-10-04 (×2): qty 1

## 2015-10-04 MED ORDER — ACETAMINOPHEN 650 MG RE SUPP: 650.0000 mg | Freq: Four times a day (QID) | RECTAL | Status: DC | PRN

## 2015-10-04 MED ORDER — PHENOL 1.4 % MT LIQD: 2.0000 | OROMUCOSAL | Status: DC | PRN

## 2015-10-04 MED ORDER — MENTHOL 3 MG MT LOZG: 1.0000 | LOZENGE | OROMUCOSAL | Status: DC | PRN

## 2015-10-04 MED ORDER — CLONAZEPAM 0.5 MG PO TABS: 0.5000 mg | ORAL_TABLET | Freq: Two times a day (BID) | ORAL | Status: DC | PRN

## 2015-10-04 MED ORDER — FUROSEMIDE 10 MG/ML IJ SOLN
40.0000 mg | Freq: Once | INTRAMUSCULAR | Status: AC
  Administered 2015-10-04: 40 mg via INTRAVENOUS
  Filled 2015-10-04: qty 4

## 2015-10-04 MED ORDER — MAGIC MOUTHWASH
15.0000 mL | Freq: Four times a day (QID) | ORAL | Status: DC | PRN
  Filled 2015-10-04: qty 15

## 2015-10-04 MED ORDER — LEVALBUTEROL HCL 1.25 MG/0.5ML IN NEBU
1.2500 mg | INHALATION_SOLUTION | Freq: Four times a day (QID) | RESPIRATORY_TRACT | Status: DC | PRN
  Filled 2015-10-04: qty 0.5

## 2015-10-04 MED ORDER — LACTATED RINGERS IV BOLUS (SEPSIS): 1000.0000 mL | Freq: Three times a day (TID) | INTRAVENOUS | Status: DC | PRN

## 2015-10-04 NOTE — Evaluation (Signed)
Physical Therapy Evaluation Patient Details Name: [name] MRN: [id number] DOB: [birth year] Today's Date: 10/04/2015   History of Present Illness  Pt s/p hiatal hernia repair and Nissen Fundiplication 10/02/15.  Pt with hx of bipolar, anxiety, L UKR and Lumbar Fusion  Clinical Impression  Pt admitted as above and presenting with functional mobility limitations 2* post op pain, fatigue and mild ambulatory balance deficits.  Pt should progress to dc home with family assist.    Follow Up Recommendations No PT follow up    Equipment Recommendations  None recommended by PT    Recommendations for Other Services OT consult     Precautions / Restrictions Restrictions Weight Bearing Restrictions: No      Mobility  Bed Mobility Overal bed mobility: Needs Assistance Bed Mobility: Supine to Sit     Supine to sit: Min guard     General bed mobility comments: cues for log roll technique  Transfers Overall transfer level: Needs assistance Equipment used: Rolling walker (2 wheeled) Transfers: Sit to/from Stand Sit to Stand: Min guard         General transfer comment: cues for transition position and use of UEs to self assist  Ambulation/Gait Ambulation/Gait assistance: Min guard Ambulation Distance (Feet): 200 Feet Assistive device: Rolling walker (2 wheeled) Gait Pattern/deviations: Step-through pattern;Decreased step length - right;Decreased step length - left;Shuffle;Trunk flexed Gait velocity: decr Gait velocity interpretation: Below normal speed for age/gender General Gait Details: min cues for position from RW; several brief standing rests required to complete task  Stairs            Wheelchair Mobility    Modified Rankin (Stroke Patients Only)       Balance                                             Pertinent Vitals/Pain Pain Assessment: 0-10 Pain Score: 3  Pain Location: Abdomen Pain Descriptors / Indicators: Pressure Pain  Intervention(s): Limited activity within patient's tolerance;Monitored during session;Premedicated before session    Home Living Family/patient expects to be discharged to:: Private residence Living Arrangements: Children Available Help at Discharge: Family Type of Home: House Home Access: Stairs to enter Entrance Stairs-Rails: Doctor, general practiceight;Left Entrance Stairs-Number of Steps: 2 Home Layout: One level Home Equipment: Environmental consultantWalker - 2 wheels;Bedside commode      Prior Function Level of Independence: Independent               Hand Dominance        Extremity/Trunk Assessment   Upper Extremity Assessment: Overall WFL for tasks assessed           Lower Extremity Assessment: Overall WFL for tasks assessed      Cervical / Trunk Assessment: Normal  Communication   Communication: No difficulties  Cognition Arousal/Alertness: Awake/alert Behavior During Therapy: WFL for tasks assessed/performed Overall Cognitive Status: Within Functional Limits for tasks assessed                      General Comments      Exercises General Exercises - Lower Extremity Ankle Circles/Pumps: AROM;Both;15 reps;Supine      Assessment/Plan    PT Assessment Patient needs continued PT services  PT Diagnosis Difficulty walking   PT Problem List Decreased strength;Decreased range of motion;Decreased activity tolerance;Decreased mobility;Decreased knowledge of use of DME;Pain  PT Treatment Interventions DME instruction;Gait training;Stair training;Functional  mobility training;Therapeutic activities;Therapeutic exercise;Patient/family education   PT Goals (Current goals can be found in the Care Plan section) Acute Rehab PT Goals Patient Stated Goal: Regain IND PT Goal Formulation: With patient Time For Goal Achievement: 10/17/15 Potential to Achieve Goals: Good    Frequency Min 3X/week   Barriers to discharge        Co-evaluation               End of Session Equipment  Utilized During Treatment: Oxygen Activity Tolerance: Patient tolerated treatment well Patient left: in chair;with call bell/phone within reach;with chair alarm set Nurse Communication: Mobility status         Time: [id number] PT Time Calculation (min) (ACUTE ONLY): 20 min   Charges:   PT Evaluation $PT Eval Low Complexity: 1 Procedure     PT G Codes:        Alexandr Oehler 10/13/2015, 12:23 PM

## 2015-10-04 NOTE — Progress Notes (Signed)
[name]  December 24, 1955 [id number]  Patient Care Team: Noel Journey. Muse, PA-C as PCP - General Danie Binder, MD as Consulting Physician (Gastroenterology)  This patient is a 60 y.o.female   Chest x-ray reviewed with radiology, Dr. Rosana Hoes.  Does show a moderate amount of subcutaneous emphysema.  However only 36 hours since surgery so most likely will improve.  Air fluid level in the left chest most likely consistent with hiatal hernia fluid collection.  Usually resolves over time.  When not actively draining at this point until other options exhausted.  Some pulmonary edema.  Had swallow study yesterday that showed no leak.  CBC normal.  Vital signs stable.  Not hypoxic.  Just on 2 L.  Not tachycardic.  Not febrile right now.  Complaining of headache only  Will diurese.  Pulmonary toilet.  Place on sips and meds today only.  He feels worse, repeat swallow study to rule out leak.  Hopefully not too likely given the fact that she has no concerning signs.  Suspect fever is due to atelectasis from inadequate pain control and being sedentary in the bed.  Try improve pulmonary toilet.  Diuresis.  Improve pain control.  Her back on her usual chronic pain and anxiety medications.  Follow closely.  Patient Active Problem List   Diagnosis Date Noted  . S/P Nissen fundoplication (without gastrostomy tube) procedure 10/02/2015  . Hiatal hernia   . Dyspepsia 07/27/2015  . Dysphagia 07/27/2015  . Abdominal pain, epigastric 07/08/2015  . Back pain 02/12/2014  . Nausea alone 12/05/2013  . PUD (peptic ulcer disease) 09/05/2013  . Constipation 09/05/2013  . Expected blood loss anemia 07/16/2013  . Overweight (BMI 25.0-29.9) 07/16/2013  . 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 Abdominal pain, unspecified site 02/13/2013  . Rectal bleeding 02/05/2013  . Abdominal pain, other specified site 02/05/2013    Past Medical History  Diagnosis Date  . Hiatal hernia   . Palpitations   . Anxiety   . Hypertension   .  Hypercholesterolemia   . Depression   . Chest pain     "STRESS"; had normal nuclear stress test 02/2011; denied recent history 02/10/14  . Arthritis   . GERD (gastroesophageal reflux disease)   . History of stomach ulcers   . Difficulty sleeping     mind racing  . Bipolar 1 disorder ([place])   . Back pain, chronic   . Heart murmur     childhood    Past Surgical History  Procedure Laterality Date  . Appendectomy    . Carpal tunnel release      BILATERAL  . Colonoscopy, esophagogastroduodenoscopy (egd) and esophageal dilation N/A 02/06/2013    RMR. Benign colon polyp, diverticulosis, hemorrhoids. EGD with Schatzki's ring s/p 34 F dilation, 3mm elliptical prepyloric antral ulcer with adjacent scarring, negative path  . Partial knee arthroplasty Left 07/15/2013    Procedure: UNICOMPARTMENTAL LEFT KNEE MEDIAL ;  Surgeon: [name], MD;  Location: [facility];  Service: Orthopedics;  Laterality: Left;, Right uni knee done also.  . Esophagogastroduodenoscopy N/A 09/23/2013    Dr. Gala Romney: non-critical Schatzki's ring, not manipulated, healed ulcer, gastric bx negative  . Lumbar fusion  02/12/2014    LEVEL 1 L4 L5    DR BROOKS  . Back surgery    . Esophagogastroduodenoscopy (egd) with propofol N/A 08/10/2015    Procedure: ESOPHAGOGASTRODUODENOSCOPY (EGD) WITH PROPOFOL;  Surgeon: Daneil Dolin, MD;  Location: [facility];  Service: Endoscopy;  Laterality: N/A;  0830  .  Maloney dilation N/A 08/10/2015    Procedure: Venia Minks DILATION;  Surgeon: Daneil Dolin, MD;  Location: [facility];  Service: Endoscopy;  Laterality: N/A;  . Esophageal manometry N/A 09/04/2015    Procedure: ESOPHAGEAL MANOMETRY (EM);  Surgeon: Wilford Corner, MD;  Location: [facility];  Service: Endoscopy;  Laterality: N/A;  . Insertion of mesh N/A 10/02/2015    Procedure: INSERTION OF MESH;  Surgeon: [name], MD;  Location: [facility];  Service: General;  Laterality: N/A;    Social History   Social History  . Marital  Status: Single    Spouse Name: N/A  . Number of Children: 2  . Years of Education: N/A   Occupational History  . Not on file.   Social History Main Topics  . Smoking status: Never Smoker   . Smokeless tobacco: Never Used     Comment: Never smoked  . Alcohol Use: No  . Drug Use: No  . Sexual Activity: No   Other Topics Concern  . Not on file   Social History Narrative    Family History  Problem Relation Age of Onset  . Heart attack Mother   . Colon cancer Neg Hx     Current Facility-Administered Medications  Medication Dose Route Frequency Provider Last Rate Last Dose  . acetaminophen (TYLENOL) suppository 650 mg  650 mg Rectal Q6H PRN Michael Boston, MD      . acetaminophen (TYLENOL) tablet 325-650 mg  325-650 mg Oral Q6H PRN Michael Boston, MD      . alum & mag hydroxide-simeth (MAALOX/MYLANTA) 200-200-20 MG/5ML suspension 30 mL  30 mL Oral Q6H Michael Boston, MD      . Derrill Memo ON 10/05/2015] alum & mag hydroxide-simeth (MAALOX/MYLANTA) 200-200-20 MG/5ML suspension 30 mL  30 mL Oral Q6H PRN Michael Boston, MD      . amphetamine-dextroamphetamine (ADDERALL) tablet 20 mg  20 mg Oral Daily Michael Boston, MD      . antiseptic oral rinse (CPC / CETYLPYRIDINIUM CHLORIDE 0.05%) solution 7 mL  7 mL Mouth Rinse BID [name], MD   7 mL at 10/04/15 1000  . bisacodyl (DULCOLAX) suppository 10 mg  10 mg Rectal Daily Michael Boston, MD      . clonazePAM Bobbye Charleston) tablet 0.5 mg  0.5 mg Oral BID PRN Michael Boston, MD      . diphenhydrAMINE (BENADRYL) injection 12.5-25 mg  12.5-25 mg Intravenous Q6H PRN Michael Boston, MD      . enoxaparin (LOVENOX) injection 40 mg  40 mg Subcutaneous Q24H [name], MD   40 mg at 10/03/15 1438  . furosemide (LASIX) injection 40 mg  40 mg Intravenous Once Michael Boston, MD      . hydrALAZINE (APRESOLINE) injection 10 mg  10 mg Intravenous Q2H PRN [name], MD      . HYDROcodone-acetaminophen (HYCET) 7.5-325 mg/15 ml solution 10-20 mL  10-20 mL Oral Q4H  PRN Michael Boston, MD      . HYDROmorphone (DILAUDID) injection 1-2 mg  1-2 mg Intravenous Q2H PRN Michael Boston, MD      . lactated ringers bolus 1,000 mL  1,000 mL Intravenous Q8H PRN Michael Boston, MD      . levalbuterol Penne Lash) nebulizer solution 1.25 mg  1.25 mg Nebulization Q6H PRN Michael Boston, MD      . linaclotide Rolan Lipa) capsule 290 mcg  290 mcg Oral Daily Michael Boston, MD      . lip balm (CARMEX) ointment 1 application  1 application Topical BID Remo Lipps  Avrian Delfavero, MD      . LORazepam (ATIVAN) injection 0.25-0.5 mg  0.25-0.5 mg Intravenous Q6H PRN Michael Boston, MD      . magic mouthwash  15 mL Oral QID PRN Michael Boston, MD      . menthol-cetylpyridinium (CEPACOL) lozenge 3 mg  1 lozenge Oral PRN Michael Boston, MD      . methocarbamol (ROBAXIN) 1,000 mg in dextrose 5 % 50 mL IVPB  1,000 mg Intravenous Q6H PRN Michael Boston, MD      . metoCLOPramide (REGLAN) injection 10 mg  10 mg Intravenous Q6H [name], MD   10 mg at 10/04/15 [id number]  . metoprolol (LOPRESSOR) injection 5 mg  5 mg Intravenous Q6H PRN Michael Boston, MD      . ondansetron (ZOFRAN-ODT) disintegrating tablet 4 mg  4 mg Oral Q6H PRN [name], MD       Or  . ondansetron [facility]) injection 4 mg  4 mg Intravenous Q6H PRN [name], MD      . pantoprazole (PROTONIX) EC tablet 40 mg  40 mg Oral BID AC Michael Boston, MD      . phenol (CHLORASEPTIC) mouth spray 2 spray  2 spray Mouth/Throat PRN Michael Boston, MD      . prochlorperazine (COMPAZINE) injection 5-10 mg  5-10 mg Intravenous Q4H PRN Michael Boston, MD      . QUEtiapine (SEROQUEL) tablet 100 mg  100 mg Oral QHS Michael Boston, MD      . simvastatin (ZOCOR) tablet 20 mg  20 mg Oral Daily Michael Boston, MD         Allergies  Allergen Reactions  . Morphine And Related Nausea Only and Other (See Comments)    Dizziness     BP 109/68 mmHg  Pulse 87  Temp(Src) 98.5 F (36.9 C) (Oral)  Resp 18  Ht 5\' 2"  (1.575 m)  Wt 68.947 kg (152 lb)  BMI 27.79 kg/m2  SpO2  98%  Dg Chest 2 View  10/04/2015  CLINICAL DATA:  Patient with shortness of breath. Recent hiatal hernia repair. EXAM: CHEST  2 VIEW COMPARISON:  Chest radiograph 02/10/2014 FINDINGS: Low lung volumes. Stable cardiac and mediastinal contours. Small bilateral pleural effusions. Bibasilar airspace opacities. Air-fluid level within the left hemi thorax. Extensive subcutaneous emphysema overlying the chest wall. IMPRESSION: Air-fluid level projecting over the posterior left hemi thorax suggestive of a left-sided hydropneumothorax. Extensive subcutaneous emphysema overlying the chest wall bilaterally. Low lung volumes with small bilateral pleural effusions and bibasilar atelectasis. Given the left hydropneumothorax and extensive chest wall emphysema, consider further evaluation with chest CT. Critical Value/emergent results were called by telephone at the time of interpretation on 10/04/2015 at 10:51 am to Dr. Johney Maine , who verbally acknowledged these results. Electronically Signed   By: Lovey Newcomer M.D.   On: 10/04/2015 10:52   Dg Esophagus W/water Sol Cm  10/03/2015  CLINICAL DATA:  One day status post Nissen fundoplication. EXAM: ESOPHOGRAM/BARIUM SWALLOW TECHNIQUE: Single contrast examination was performed using water-soluble contrast. FLUOROSCOPY TIME:  40 seconds, 29 mGy COMPARISON:  CT 07/09/2015 FINDINGS: Scout image unremarkable. Patient swallowed the water-soluble contrast without difficulty. Mild esophageal dysmotility with tertiary contractions. Mild Tapered narrowing of the lumen at the GE junction. There is passage of contrast into the stomach. Stomach is decompressed, unremarkable. No evidence of leak. IMPRESSION: 1. Patent GE junction post Nissen fundoplication, with no evidence of leak. Electronically Signed   By: Lucrezia Europe M.D.   On: 10/03/2015 11:52  Note: This dictation was prepared with Dragon/digital dictation along with [employer]. Any transcriptional errors that result from  this process are unintentional.

## 2015-10-04 NOTE — Progress Notes (Signed)
Patient ID: [name], female   DOB: 1955/07/15, 60 y.o.   MRN: [id number] [facility] Surgery Progress Note:   2 Days Post-Op  Subjective: Mental status is clear.  She had a shaking chill last night and temp to 101.  SOB when she gets up to the bathroom.  Objective: Vital signs in last 24 hours: Temp:  [97.7 F (36.5 C)-101.1 F (38.4 C)] 98.6 F (37 C) (05/28 0525) Pulse Rate:  [85-112] 87 (05/28 0525) Resp:  [16-18] 18 (05/28 0525) BP: (107-133)/(60-74) 109/68 mmHg (05/28 0525) SpO2:  [91 %-98 %] 98 % (05/28 0525)  Intake/Output from previous day: 05/27 0701 - 05/28 0700 In: 890 [P.O.:90; I.V.:800] Out: 1100 [Urine:1100] Intake/Output this shift:    Physical Exam: Work of breathing is not labored at rest.  BS = but crepitus;    Lab Results:  Results for orders placed or performed during the hospital encounter of 10/02/15 (from the past 48 hour(s))  Basic metabolic panel     Status: Abnormal   Collection Time: 10/03/15  4:35 AM  Result Value Ref Range   Sodium 139 135 - 145 mmol/L   Potassium 3.6 3.5 - 5.1 mmol/L   Chloride 109 101 - 111 mmol/L   CO2 26 22 - 32 mmol/L   Glucose, Bld 126 (H) 65 - 99 mg/dL   BUN 11 6 - 20 mg/dL   Creatinine, Ser 0.81 0.44 - 1.00 mg/dL   Calcium 8.5 (L) 8.9 - 10.3 mg/dL   GFR calc non Af Amer >60 >60 mL/min   GFR calc Af Amer >60 >60 mL/min    Comment: (NOTE) The eGFR has been calculated using the CKD EPI equation. This calculation has not been validated in all clinical situations. eGFR's persistently <60 mL/min signify possible Chronic Kidney Disease.    Anion gap 4 (L) 5 - 15    Radiology/Results: Dg Esophagus W/water Sol Cm  10/03/2015  CLINICAL DATA:  One day status post Nissen fundoplication. EXAM: ESOPHOGRAM/BARIUM SWALLOW TECHNIQUE: Single contrast examination was performed using water-soluble contrast. FLUOROSCOPY TIME:  40 seconds, 29 mGy COMPARISON:  CT 07/09/2015 FINDINGS: Scout image unremarkable. Patient swallowed  the water-soluble contrast without difficulty. Mild esophageal dysmotility with tertiary contractions. Mild Tapered narrowing of the lumen at the GE junction. There is passage of contrast into the stomach. Stomach is decompressed, unremarkable. No evidence of leak. IMPRESSION: 1. Patent GE junction post Nissen fundoplication, with no evidence of leak. Electronically Signed   By: Lucrezia Europe M.D.   On: 10/03/2015 11:52    Anti-infectives: Anti-infectives    Start     Dose/Rate Route Frequency Ordered Stop   10/02/15 0535  ceFAZolin (ANCEF) IVPB 2g/100 mL premix     2 g 200 mL/hr over 30 Minutes Intravenous On call to O.R. 10/02/15 0535 10/02/15 0739      Assessment/Plan: Problem List: Patient Active Problem List   Diagnosis Date Noted  . S/P Nissen fundoplication (without gastrostomy tube) procedure 10/02/2015  . Hiatal hernia   . Dyspepsia 07/27/2015  . Dysphagia 07/27/2015  . Abdominal pain, epigastric 07/08/2015  . Back pain 02/12/2014  . Nausea alone 12/05/2013  . PUD (peptic ulcer disease) 09/05/2013  . Constipation 09/05/2013  . Expected blood loss anemia 07/16/2013  . Overweight (BMI 25.0-29.9) 07/16/2013  . 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 Abdominal pain, unspecified site 02/13/2013  . Rectal bleeding 02/05/2013  . Abdominal pain, other specified site 02/05/2013    Will check CBC and CXR.  Not ready  for discharge yet.   2 Days Post-Op    LOS: 2 days   Matt B. Hassell Done, MD, [facility] [phone] beeper [phone]  10/04/2015 9:30 AM

## 2015-10-05 ENCOUNTER — Inpatient Hospital Stay (HOSPITAL_COMMUNITY): Payer: Medicaid Other

## 2015-10-05 LAB — CBC
HEMATOCRIT: 34.2 % — AB (ref 36.0–46.0)
Hemoglobin: 11.3 g/dL — ABNORMAL LOW (ref 12.0–15.0)
MCH: 28.8 pg (ref 26.0–34.0)
MCHC: 33 g/dL (ref 30.0–36.0)
MCV: 87.2 fL (ref 78.0–100.0)
Platelets: 143 10*3/uL — ABNORMAL LOW (ref 150–400)
RBC: 3.92 MIL/uL (ref 3.87–5.11)
RDW: 13.1 % (ref 11.5–15.5)
WBC: 5.3 10*3/uL (ref 4.0–10.5)

## 2015-10-05 MED ORDER — HYDROCODONE-ACETAMINOPHEN 7.5-325 MG/15ML PO SOLN: 10.0000 mL | ORAL | Status: DC | PRN

## 2015-10-05 NOTE — Progress Notes (Signed)
Utilization review completed.  

## 2015-10-05 NOTE — Discharge Summary (Signed)
Physician Discharge Summary  Patient ID: [name] MRN: [id number] DOB/AGE: 60/25/1957 60 y.o.  Admit date: 10/02/2015 Discharge date: 10/05/2015  Admission Diagnoses:  Hiatal hernia with reflux  Discharge Diagnoses:  same  Active Problems:   S/P Nissen fundoplication (without gastrostomy tube) procedure   Surgery:  Robotic Nissen fundoplication  Discharged Condition: improved  Hospital Course:   Had surgery on Friday.  Sat swallow OK.  Temp to 101 on Sat night and some complaints breathing.  CXR showed air/fluid in prior HH cavity.  Ready for discharge on Monday;  Walking and breathing OK.  Patient states she is ready to go home.    Consults: none  Significant Diagnostic Studies: chest xray    Discharge Exam: Blood pressure 147/84, pulse 88, temperature 98.4 F (36.9 C), temperature source Oral, resp. rate 14, height 5\' 2"  (1.575 m), weight 68.947 kg (152 lb), SpO2 97 %. Incisions OK.  Disposition: 01-Home or Self Care  Discharge Instructions    Diet - low sodium heart healthy    Complete by:  As directed      Discharge instructions    Complete by:  As directed   Stay on liquids for a week then advance to PUREED diet.     Increase activity slowly    Complete by:  As directed             Medication List    TAKE these medications        amphetamine-dextroamphetamine 20 MG tablet  Commonly known as:  ADDERALL  Take 20 mg by mouth daily.     HYDROcodone-acetaminophen 7.5-325 mg/15 ml solution  Commonly known as:  HYCET  Take 10 mLs by mouth 4 (four) times daily as needed for moderate pain.     HYDROcodone-acetaminophen 7.5-325 mg/15 ml solution  Commonly known as:  HYCET  Take 10-20 mLs by mouth every 4 (four) hours as needed for moderate pain or severe pain.     KLONOPIN 0.5 MG tablet  Generic drug:  clonazePAM  Take 1 mg by mouth 2 (two) times daily as needed for anxiety.     linaclotide 290 MCG Caps capsule  Commonly known as:  LINZESS  Take 1  capsule (290 mcg total) by mouth daily.     lisinopril 20 MG tablet  Commonly known as:  PRINIVIL,ZESTRIL  Take 20 mg by mouth every morning.     pantoprazole 40 MG tablet  Commonly known as:  PROTONIX  Take 1 tablet (40 mg total) by mouth daily. Take 30 minutes before breakfast daily.     QUEtiapine 100 MG tablet  Commonly known as:  SEROQUEL  Take 100 mg by mouth at bedtime.     simvastatin 20 MG tablet  Commonly known as:  ZOCOR  Take 20 mg by mouth daily.     TYLENOL ARTHRITIS PAIN 650 MG CR tablet  Generic drug:  acetaminophen  Take 650 mg by mouth every 8 (eight) hours as needed for pain.           Follow-up Information    Follow up with Reyes Ivan, MD In 2 weeks.   Specialty:  General Surgery   Contact information:   [place] [phone]       Signed: Pedro Earls 10/05/2015, 10:05 AM

## 2015-10-26 ENCOUNTER — Ambulatory Visit: Payer: Medicaid Other | Admitting: Gastroenterology

## 2015-12-21 ENCOUNTER — Ambulatory Visit: Payer: Self-pay | Admitting: Family Medicine

## 2015-12-22 ENCOUNTER — Encounter: Payer: Self-pay | Admitting: Family Medicine

## 2016-02-07 NOTE — Therapy (Signed)
[facility] [place], [place], [address] Phone: [phone]   Fax:  [phone]  Physical Therapy Treatment  Patient Details  Name: [name] MRN: [id number] Date of Birth: 04/16/1956 No Data Recorded  Encounter Date: 11/24/2014    Past Medical History:  Diagnosis Date  . Anxiety   . Arthritis   . Back pain, chronic   . Bipolar 1 disorder ([place])   . Chest pain    "STRESS"; had normal nuclear stress test 02/2011; denied recent history 02/10/14  . Depression   . Difficulty sleeping    mind racing  . GERD (gastroesophageal reflux disease)   . Heart murmur    childhood  . Hiatal hernia   . History of stomach ulcers   . Hypercholesterolemia   . Hypertension   . Palpitations     Past Surgical History:  Procedure Laterality Date  . APPENDECTOMY    . BACK SURGERY    . CARPAL TUNNEL RELEASE     BILATERAL  . COLONOSCOPY, ESOPHAGOGASTRODUODENOSCOPY (EGD) AND ESOPHAGEAL DILATION N/A 02/06/2013   RMR. Benign colon polyp, diverticulosis, hemorrhoids. EGD with Schatzki's ring s/p 58 F dilation, 28m elliptical prepyloric antral ulcer with adjacent scarring, negative path  . ESOPHAGEAL MANOMETRY N/A 09/04/2015   Procedure: ESOPHAGEAL MANOMETRY (EM);  Surgeon: VWilford Corner MD;  Location: [facility];  Service: Endoscopy;  Laterality: N/A;  . ESOPHAGOGASTRODUODENOSCOPY N/A 09/23/2013   Dr. RGala Romney non-critical Schatzki's ring, not manipulated, healed ulcer, gastric bx negative  . ESOPHAGOGASTRODUODENOSCOPY (EGD) WITH PROPOFOL N/A 08/10/2015   Procedure: ESOPHAGOGASTRODUODENOSCOPY (EGD) WITH PROPOFOL;  Surgeon: RDaneil Dolin MD;  Location: [facility];  Service: Endoscopy;  Laterality: N/A;  0830  . INSERTION OF MESH N/A 10/02/2015   Procedure: INSERTION OF MESH;  Surgeon: ARalene Ok MD;  Location: [facility];  Service: General;  Laterality: N/A;  . LUMBAR FUSION  02/12/2014   LEVEL 1 L4 L5    DR BROOKS  . MALONEY  DILATION N/A 08/10/2015   Procedure: MVenia MinksDILATION;  Surgeon: RDaneil Dolin MD;  Location: [facility];  Service: Endoscopy;  Laterality: N/A;  . PARTIAL KNEE ARTHROPLASTY Left 07/15/2013   Procedure: UNICOMPARTMENTAL LEFT KNEE MEDIAL ;  Surgeon: MMauri Pole MD;  Location: [facility];  Service: Orthopedics;  Laterality: Left;, Right uni knee done also.    There were no vitals filed for this visit.                                    PT Long Term Goals - 11/24/14 1151      PT LONG TERM GOAL #1   Title Ind with advanced HEP.   Baseline No knowledge of advanced ther ex.   Time 6   Period Weeks   Status On-going     PT LONG TERM GOAL #2   Title Full active right knee extension.   Baseline -8 degrees.   Time 6   Period Weeks   Status Achieved  11/24/2014 0 deg  AROM     PT LONG TERM GOAL #3   Title Active right knee flexion to 115 degrees.   Baseline 90 degrees.   Time 6   Period Weeks   Status Achieved  11/24/2014 AROM 115 deg     PT LONG TERM GOAL #4   Title Increase right knee strength to 5/5.   Baseline Decreased volitional contraction of right quadriceps.  Time 6   Period Weeks   Status On-going     PT LONG TERM GOAL #5   Title Perform ADL's with pain not > 3/10.   Baseline Pain currently rated at 7-8/10.   Time 6   Period Weeks   Status On-going             Patient will benefit from skilled therapeutic intervention in order to improve the following deficits and impairments:  Pain, Decreased activity tolerance, Decreased range of motion, Decreased strength, Increased edema  Visit Diagnosis: Right knee pain  Knee stiffness, right  Right low back pain, with sciatica presence unspecified     Problem List Patient Active Problem List   Diagnosis Date Noted  . S/P Nissen fundoplication (without gastrostomy tube) procedure 10/02/2015  . Hiatal hernia   . Dyspepsia 07/27/2015  . Dysphagia 07/27/2015  . Abdominal pain,  epigastric 07/08/2015  . Back pain 02/12/2014  . Nausea alone 12/05/2013  . PUD (peptic ulcer disease) 09/05/2013  . Constipation 09/05/2013  . Expected blood loss anemia 07/16/2013  . Overweight (BMI 25.0-29.9) 07/16/2013  . 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 Abdominal pain, unspecified site 02/13/2013  . Rectal bleeding 02/05/2013  . Abdominal pain, other specified site 02/05/2013   PHYSICAL THERAPY DISCHARGE SUMMARY  Visits from Start of Care:   Current functional level related to goals / functional outcomes: Please see above.   Remaining deficits: Good progress though the patient continues to experience right knee pain and loss of strength.   Education / Equipment: HEP. Plan: Patient agrees to discharge.  Patient goals were partially met. Patient is being discharged due to not returning since the last visit.  ?????      Aj Crunkleton, [place] MPT 02/07/2016, 3:14 PM  [facility] [address] [place], [place], [address] Phone: [phone]   Fax:  [phone]  Name: LANESSA SHILL MRN: [id number] Date of Birth: Aug 15, 1955

## 2016-03-01 ENCOUNTER — Encounter: Payer: Self-pay | Admitting: Family

## 2016-03-01 ENCOUNTER — Ambulatory Visit (INDEPENDENT_AMBULATORY_CARE_PROVIDER_SITE_OTHER): Payer: Medicaid Other | Admitting: Family

## 2016-03-01 VITALS — BP 139/86 | HR 88 | Temp 97.9°F | Ht 62.0 in | Wt 146.4 lb

## 2016-03-01 DIAGNOSIS — E782 Mixed hyperlipidemia: Secondary | ICD-10-CM

## 2016-03-01 DIAGNOSIS — G8929 Other chronic pain: Secondary | ICD-10-CM | POA: Diagnosis not present

## 2016-03-01 DIAGNOSIS — Z1159 Encounter for screening for other viral diseases: Secondary | ICD-10-CM

## 2016-03-01 DIAGNOSIS — G4700 Insomnia, unspecified: Secondary | ICD-10-CM | POA: Diagnosis not present

## 2016-03-01 DIAGNOSIS — E663 Overweight: Secondary | ICD-10-CM | POA: Diagnosis not present

## 2016-03-01 DIAGNOSIS — I10 Essential (primary) hypertension: Secondary | ICD-10-CM | POA: Insufficient documentation

## 2016-03-01 DIAGNOSIS — F411 Generalized anxiety disorder: Secondary | ICD-10-CM | POA: Diagnosis not present

## 2016-03-01 DIAGNOSIS — F1320 Sedative, hypnotic or anxiolytic dependence, uncomplicated: Secondary | ICD-10-CM

## 2016-03-01 DIAGNOSIS — Z114 Encounter for screening for human immunodeficiency virus [HIV]: Secondary | ICD-10-CM | POA: Diagnosis not present

## 2016-03-01 DIAGNOSIS — K5900 Constipation, unspecified: Secondary | ICD-10-CM

## 2016-03-01 DIAGNOSIS — M545 Low back pain: Secondary | ICD-10-CM

## 2016-03-01 DIAGNOSIS — K279 Peptic ulcer, site unspecified, unspecified as acute or chronic, without hemorrhage or perforation: Secondary | ICD-10-CM

## 2016-03-01 DIAGNOSIS — E785 Hyperlipidemia, unspecified: Secondary | ICD-10-CM | POA: Insufficient documentation

## 2016-03-01 MED ORDER — ALPRAZOLAM 0.5 MG PO TABS: 0.5000 mg | ORAL_TABLET | Freq: Two times a day (BID) | ORAL | 1 refills | Status: DC | PRN

## 2016-03-01 MED ORDER — ESCITALOPRAM OXALATE 10 MG PO TABS: 10.0000 mg | ORAL_TABLET | Freq: Every day | ORAL | 3 refills | Status: DC

## 2016-03-01 NOTE — Patient Instructions (Signed)
Generalized Anxiety Disorder Generalized anxiety disorder (GAD) is a mental disorder. It interferes with life functions, including relationships, work, and school. GAD is different from normal anxiety, which everyone experiences at some point in their lives in response to specific life events and activities. Normal anxiety actually helps us prepare for and get through these life events and activities. Normal anxiety goes away after the event or activity is over.  GAD causes anxiety that is not necessarily related to specific events or activities. It also causes excess anxiety in proportion to specific events or activities. The anxiety associated with GAD is also difficult to control. GAD can vary from mild to severe. People with severe GAD can have intense waves of anxiety with physical symptoms (panic attacks).  SYMPTOMS The anxiety and worry associated with GAD are difficult to control. This anxiety and worry are related to many life events and activities and also occur more days than not for 6 months or longer. People with GAD also have three or more of the following symptoms (one or more in children):  Restlessness.   Fatigue.  Difficulty concentrating.   Irritability.  Muscle tension.  Difficulty sleeping or unsatisfying sleep. DIAGNOSIS GAD is diagnosed through an assessment by your health care provider. Your health care provider will ask you questions aboutyour mood,physical symptoms, and events in your life. Your health care provider may ask you about your medical history and use of alcohol or drugs, including prescription medicines. Your health care provider may also do a physical exam and blood tests. Certain medical conditions and the use of certain substances can cause symptoms similar to those associated with GAD. Your health care provider may refer you to a mental health specialist for further evaluation. TREATMENT The following therapies are usually used to treat GAD:    Medication. Antidepressant medication usually is prescribed for long-term daily control. Antianxiety medicines may be added in severe cases, especially when panic attacks occur.   Talk therapy (psychotherapy). Certain types of talk therapy can be helpful in treating GAD by providing support, education, and guidance. A form of talk therapy called cognitive behavioral therapy can teach you healthy ways to think about and react to daily life events and activities.  Stress managementtechniques. These include yoga, meditation, and exercise and can be very helpful when they are practiced regularly. A mental health specialist can help determine which treatment is best for you. Some people see improvement with one therapy. However, other people require a combination of therapies.   This information is not intended to replace advice given to you by your health care provider. Make sure you discuss any questions you have with your health care provider.   Document Released: 08/20/2012 Document Revised: 05/16/2014 Document Reviewed: 08/20/2012 Elsevier Interactive Patient Education 2016 Elsevier Inc.  

## 2016-03-01 NOTE — Progress Notes (Signed)
Subjective:    Patient ID: [name], female    DOB: 1955/09/23, 60 y.o.   MRN: [id number]  Pt presents to the office today to establish care. [facility]  Anxiety  Presents for follow-up visit. Symptoms include decreased concentration, excessive worry, insomnia, irritability, nervous/anxious behavior, panic and restlessness. Patient reports no chest pain, depressed mood, palpitations or shortness of breath. Symptoms occur occasionally. The quality of sleep is good.    Hypertension  This is a chronic problem. The current episode started more than 1 year ago. The problem has been resolved since onset. The problem is controlled. Associated symptoms include anxiety. Pertinent negatives include no chest pain, headaches, palpitations, peripheral edema or shortness of breath. Risk factors for coronary artery disease include dyslipidemia, obesity and sedentary lifestyle. Past treatments include ACE inhibitors. The current treatment provides moderate improvement. There is no history of kidney disease, CAD/MI, CVA or heart failure. There is no history of sleep apnea.  Hyperlipidemia  This is a chronic problem. The current episode started more than 1 year ago. The problem is controlled. Recent lipid tests were reviewed and are normal. Exacerbating diseases include obesity. Pertinent negatives include no chest pain or shortness of breath. Current antihyperlipidemic treatment includes statins. The current treatment provides moderate improvement of lipids. Risk factors for coronary artery disease include dyslipidemia, hypertension and obesity.  Insomnia  Primary symptoms: difficulty falling asleep, no frequent awakening.  The current episode started more than one year. The onset quality is gradual. The problem occurs nightly. The problem has been resolved since onset. The symptoms are aggravated by anxiety. Past treatments include medication.  Back Pain  This is a chronic  problem. The current episode started more than 1 year ago. The problem occurs intermittently. The problem has been waxing and waning since onset. The pain is present in the lumbar spine. The pain does not radiate. The pain is at a severity of 1/10. The symptoms are aggravated by bending and twisting. Pertinent negatives include no bladder incontinence, bowel incontinence, chest pain or headaches. She has tried bed rest and home exercises for the symptoms. The treatment provided mild relief.  Gastroesophageal Reflux  She reports no chest pain, no dysphagia, no heartburn, no sore throat or no water brash. This is a chronic problem. The current episode started more than 1 year ago. The problem occurs rarely. The problem has been resolved. The symptoms are aggravated by certain foods. She has tried a PPI for the symptoms. The treatment provided moderate relief.  Constipation  This is a chronic problem. The current episode started more than 1 year ago. The problem has been resolved since onset. Her stool frequency is 1 time per day. The patient is on a high fiber diet. Associated symptoms include back pain. She has tried diet changes and laxatives for the symptoms. The treatment provided moderate relief.      Review of Systems  Constitutional: Positive for irritability.  HENT: Negative for sore throat.   Respiratory: Negative for shortness of breath.   Cardiovascular: Negative for chest pain and palpitations.  Gastrointestinal: Positive for constipation. Negative for bowel incontinence, dysphagia and heartburn.  Genitourinary: Negative for bladder incontinence.  Musculoskeletal: Positive for back pain.  Neurological: Negative for headaches.  Psychiatric/Behavioral: Positive for decreased concentration. The patient is nervous/anxious and has insomnia.   All other systems reviewed and are negative.  Social History   Social History  . Marital status: Single    Spouse name: N/A  .  Number of  children: 2  . Years of education: N/A   Social History Main Topics  . Smoking status: Never Smoker  . Smokeless tobacco: Never Used     Comment: Never smoked  . Alcohol use No  . Drug use: No  . Sexual activity: No   Other Topics Concern  . None   Social History Narrative  . None    Family History  Problem Relation Age of Onset  . Heart attack Mother   . Colon cancer Neg Hx        Objective:   Physical Exam  Constitutional: She is oriented to person, place, and time. She appears well-developed and well-nourished. No distress.  HENT:  Head: Normocephalic and atraumatic.  Right Ear: External ear normal.  Left Ear: External ear normal.  Nose: Nose normal.  Mouth/Throat: Oropharynx is clear and moist.  Eyes: Pupils are equal, round, and reactive to light.  Neck: Normal range of motion. Neck supple. No thyromegaly present.  Cardiovascular: Normal rate, regular rhythm, normal heart sounds and intact distal pulses.   No murmur heard. Pulmonary/Chest: Effort normal and breath sounds normal. No respiratory distress. She has no wheezes.  Abdominal: Soft. Bowel sounds are normal. She exhibits no distension. There is no tenderness.  Musculoskeletal: Normal range of motion. She exhibits no edema or tenderness.  Neurological: She is alert and oriented to person, place, and time. She has normal reflexes. No cranial nerve deficit.  Skin: Skin is warm and dry.  Psychiatric: She has a normal mood and affect. Her behavior is normal. Judgment and thought content normal.  Vitals reviewed.     BP 139/86   Pulse 88   Temp 97.9 F (36.6 C) (Oral)   Ht '5\' 2"'$  (1.575 m)   Wt 146 lb 6.4 oz (66.4 kg)   BMI 26.78 kg/m      Assessment & Plan:  1. Essential hypertension - CMP14+EGFR  2. Constipation, unspecified constipation type - CMP14+EGFR  3. Chronic bilateral low back pain, with sciatica presence unspecified - CMP14+EGFR  4. Overweight (BMI 25.0-29.9) - CMP14+EGFR  5.  PUD (peptic ulcer disease) - CMP14+EGFR  6. Insomnia, unspecified type - CMP14+EGFR  7. Mixed hyperlipidemia - CMP14+EGFR - Lipid panel  8. GAD (generalized anxiety disorder) -PT's adderall stopped today, pt's Klonopin stopped today Pt started on xanax today per request, but will do urine drug screen today -PT started on lexapro 10 mg today Stress management disucssed RTO in 5 weeks  - escitalopra(LEXAPRO) 10 MG tablet; Take 1 tablet (10 mg total) by mouth daily.  Dispense: 90 tablet; Refill: 3 - ALPRAZolam (XANAX) 0.5 MG tablet; Take 1 tablet (0.5 mg total) by mouth 2 (two) times daily as needed for anxiety.  Dispense: 60 tablet; Refill: 1 - CMP14+EGFR - ToxASSURE Select 13 (MW), Urine  9. Encounter for screening for HIV - CMP14+EGFR - HIV antibody  10. Need for hepatitis C screening test - CMP14+EGFR - Hepatitis C antibody  11. Benzodiazepine dependence ([place]) - CMP14+EGFR - ToxASSURE Select 13 (MW), Urine   Continue all meds Labs pending Health Maintenance reviewed- PT to schedule mammogram appt when she leaves  Diet and exercise encouraged RTO 5 weeks to recheck GAD  Evelina Dun, FNP

## 2016-03-02 ENCOUNTER — Other Ambulatory Visit: Payer: Self-pay | Admitting: Family

## 2016-03-02 LAB — LIPID PANEL
CHOL/HDL RATIO: 3.8 ratio (ref 0.0–4.4)
Cholesterol, Total: 270 mg/dL — ABNORMAL HIGH (ref 100–199)
HDL: 71 mg/dL (ref >39)
LDL CALC: 166 mg/dL — AB (ref 0–99)
Triglycerides: 165 mg/dL — ABNORMAL HIGH (ref 0–149)
VLDL CHOLESTEROL CAL: 33 mg/dL (ref 5–40)

## 2016-03-02 LAB — CMP14+EGFR
ALBUMIN: 4.4 g/dL (ref 3.6–4.8)
ALK PHOS: 110 IU/L (ref 39–117)
ALT: 11 IU/L (ref 0–32)
AST: 22 IU/L (ref 0–40)
Albumin/Globulin Ratio: 1.3 (ref 1.2–2.2)
BUN / CREAT RATIO: 12 (ref 12–28)
BUN: 11 mg/dL (ref 8–27)
Bilirubin Total: 0.4 mg/dL (ref 0.0–1.2)
CO2: 25 mmol/L (ref 18–29)
CREATININE: 0.93 mg/dL (ref 0.57–1.00)
Calcium: 9.7 mg/dL (ref 8.7–10.3)
Chloride: 105 mmol/L (ref 96–106)
GFR calc non Af Amer: 67 mL/min/{1.73_m2} (ref >59)
GFR, EST AFRICAN AMERICAN: 77 mL/min/{1.73_m2} (ref >59)
GLOBULIN, TOTAL: 3.3 g/dL (ref 1.5–4.5)
GLUCOSE: 86 mg/dL (ref 65–99)
Potassium: 3.9 mmol/L (ref 3.5–5.2)
SODIUM: 146 mmol/L — AB (ref 134–144)
TOTAL PROTEIN: 7.7 g/dL (ref 6.0–8.5)

## 2016-03-02 LAB — HIV ANTIBODY (ROUTINE TESTING W REFLEX): HIV SCREEN 4TH GENERATION: NONREACTIVE

## 2016-03-02 LAB — HEPATITIS C ANTIBODY

## 2016-03-02 MED ORDER — ATORVASTATIN CALCIUM 20 MG PO TABS: 20.0000 mg | ORAL_TABLET | Freq: Every day | ORAL | 3 refills | Status: DC

## 2016-03-07 LAB — TOXASSURE SELECT 13 (MW), URINE

## 2016-03-07 NOTE — Progress Notes (Signed)
Patient aware.

## 2016-03-29 ENCOUNTER — Ambulatory Visit (INDEPENDENT_AMBULATORY_CARE_PROVIDER_SITE_OTHER): Payer: Medicaid Other | Admitting: Family

## 2016-03-29 ENCOUNTER — Encounter: Payer: Self-pay | Admitting: Family

## 2016-03-29 VITALS — BP 104/69 | HR 87 | Temp 98.0°F | Ht 62.0 in | Wt 144.8 lb

## 2016-03-29 DIAGNOSIS — G4700 Insomnia, unspecified: Secondary | ICD-10-CM | POA: Diagnosis not present

## 2016-03-29 DIAGNOSIS — F411 Generalized anxiety disorder: Secondary | ICD-10-CM | POA: Diagnosis not present

## 2016-03-29 DIAGNOSIS — F331 Major depressive disorder, recurrent, moderate: Secondary | ICD-10-CM

## 2016-03-29 NOTE — Patient Instructions (Signed)
Generalized Anxiety Disorder Generalized anxiety disorder (GAD) is a mental disorder. It interferes with life functions, including relationships, work, and school. GAD is different from normal anxiety, which everyone experiences at some point in their lives in response to specific life events and activities. Normal anxiety actually helps us prepare for and get through these life events and activities. Normal anxiety goes away after the event or activity is over.  GAD causes anxiety that is not necessarily related to specific events or activities. It also causes excess anxiety in proportion to specific events or activities. The anxiety associated with GAD is also difficult to control. GAD can vary from mild to severe. People with severe GAD can have intense waves of anxiety with physical symptoms (panic attacks).  SYMPTOMS The anxiety and worry associated with GAD are difficult to control. This anxiety and worry are related to many life events and activities and also occur more days than not for 6 months or longer. People with GAD also have three or more of the following symptoms (one or more in children):  Restlessness.   Fatigue.  Difficulty concentrating.   Irritability.  Muscle tension.  Difficulty sleeping or unsatisfying sleep. DIAGNOSIS GAD is diagnosed through an assessment by your health care provider. Your health care provider will ask you questions aboutyour mood,physical symptoms, and events in your life. Your health care provider may ask you about your medical history and use of alcohol or drugs, including prescription medicines. Your health care provider may also do a physical exam and blood tests. Certain medical conditions and the use of certain substances can cause symptoms similar to those associated with GAD. Your health care provider may refer you to a mental health specialist for further evaluation. TREATMENT The following therapies are usually used to treat GAD:    Medication. Antidepressant medication usually is prescribed for long-term daily control. Antianxiety medicines may be added in severe cases, especially when panic attacks occur.   Talk therapy (psychotherapy). Certain types of talk therapy can be helpful in treating GAD by providing support, education, and guidance. A form of talk therapy called cognitive behavioral therapy can teach you healthy ways to think about and react to daily life events and activities.  Stress managementtechniques. These include yoga, meditation, and exercise and can be very helpful when they are practiced regularly. A mental health specialist can help determine which treatment is best for you. Some people see improvement with one therapy. However, other people require a combination of therapies. This information is not intended to replace advice given to you by your health care provider. Make sure you discuss any questions you have with your health care provider. Document Released: 08/20/2012 Document Revised: 05/16/2014 Document Reviewed: 08/20/2012 Elsevier Interactive Patient Education  2017 Elsevier Inc.  

## 2016-03-29 NOTE — Progress Notes (Signed)
   Subjective:    Patient ID: [name], female    DOB: 1956/03/23, 60 y.o.   MRN: [id number]  Pt presents to the office today to recheck GAD. PT started on lexapro 10 mg. Pt states she was "seeing things" so she stopped it. Pt was reviewed [place] Controlled Database, pt refilled her clonazepam and adderall on 03/05/16, but had refilled her xanax rx that I had given her on 03/01/16. Pt was confronted about this and states the pharmacy refilled it without asking and pt did not know she had the prescriptions.  Anxiety  Presents for follow-up visit. Symptoms include decreased concentration, depressed mood, excessive worry, insomnia, irritability, nervous/anxious behavior, palpitations and restlessness. Patient reports no suicidal ideas. Symptoms occur constantly. The quality of sleep is poor.    Insomnia  Primary symptoms: difficulty falling asleep, frequent awakening.  The current episode started more than one year. The problem occurs intermittently. Past treatments include medication. The treatment provided no relief.      Review of Systems  Constitutional: Positive for irritability.  Cardiovascular: Positive for palpitations.  Psychiatric/Behavioral: Positive for decreased concentration. Negative for suicidal ideas. The patient is nervous/anxious and has insomnia.   All other systems reviewed and are negative.      Objective:   Physical Exam  Constitutional: She is oriented to person, place, and time. She appears well-developed and well-nourished. No distress.  HENT:  Head: Normocephalic.  Cardiovascular: Normal rate, regular rhythm, normal heart sounds and intact distal pulses.   No murmur heard. Pulmonary/Chest: Effort normal and breath sounds normal. No respiratory distress. She has no wheezes.  Abdominal: Soft. Bowel sounds are normal. She exhibits no distension. There is no tenderness.  Musculoskeletal: Normal range of motion. She exhibits no edema or tenderness.  Neurological:  She is alert and oriented to person, place, and time.  Skin: Skin is warm and dry.  Psychiatric: She has a normal mood and affect. Her behavior is normal. Judgment and thought content normal.  Vitals reviewed.     BP 104/69   Pulse 87   Temp 98 F (36.7 C) (Oral)   Ht 5\' 2"  (1.575 m)   Wt 144 lb 12.8 oz (65.7 kg)   BMI 26.48 kg/m      Assessment & Plan:  1. GAD (generalized anxiety disorder) - Ambulatory referral to Psychiatry  2. Moderate episode of recurrent major depressive disorder ([place]) - Ambulatory referral to Psychiatry  3. Insomnia, unspecified type - Ambulatory referral to Psychiatry  Long discussion with patient that I would not be able to prescribe her any controlled substances today. I placed a referral to Psychiatry and gave her a list of local places in the area for her to call and make an appt.   Evelina Dun, FNP

## 2016-04-04 ENCOUNTER — Other Ambulatory Visit: Payer: Self-pay | Admitting: Family

## 2016-04-04 DIAGNOSIS — Z9114 Patient's other noncompliance with medication regimen: Secondary | ICD-10-CM

## 2016-04-04 DIAGNOSIS — F411 Generalized anxiety disorder: Secondary | ICD-10-CM

## 2016-04-05 DIAGNOSIS — Z9114 Patient's other noncompliance with medication regimen: Secondary | ICD-10-CM | POA: Insufficient documentation

## 2016-04-15 ENCOUNTER — Encounter: Payer: Self-pay | Admitting: Physician Assistant

## 2016-04-15 ENCOUNTER — Ambulatory Visit (INDEPENDENT_AMBULATORY_CARE_PROVIDER_SITE_OTHER): Payer: Medicaid Other | Admitting: Physician Assistant

## 2016-04-15 VITALS — BP 140/99 | HR 94 | Temp 98.8°F | Ht 62.0 in | Wt 154.0 lb

## 2016-04-15 DIAGNOSIS — B351 Tinea unguium: Secondary | ICD-10-CM

## 2016-04-15 DIAGNOSIS — F411 Generalized anxiety disorder: Secondary | ICD-10-CM

## 2016-04-15 DIAGNOSIS — F39 Unspecified mood [affective] disorder: Secondary | ICD-10-CM

## 2016-04-15 MED ORDER — DULOXETINE HCL 30 MG PO CPEP
30.0000 mg | ORAL_CAPSULE | Freq: Every day | ORAL | 1 refills | Status: DC
Start: 2016-04-15 — End: 2016-05-12

## 2016-04-15 MED ORDER — QUETIAPINE FUMARATE 300 MG PO TABS: 300.0000 mg | ORAL_TABLET | Freq: Every day | ORAL | 6 refills | Status: DC

## 2016-04-15 MED ORDER — TERBINAFINE HCL 250 MG PO TABS: 250.0000 mg | ORAL_TABLET | Freq: Every day | ORAL | 1 refills | Status: DC

## 2016-04-15 MED ORDER — ALPRAZOLAM 0.5 MG PO TABS: 0.5000 mg | ORAL_TABLET | Freq: Two times a day (BID) | ORAL | 1 refills | Status: DC | PRN

## 2016-04-15 NOTE — Progress Notes (Signed)
BP (!) 140/99   Pulse 94   Temp 98.8 F (37.1 C) (Oral)   Ht 5\' 2"  (1.575 m)   Wt 154 lb (69.9 kg)   BMI 28.17 kg/m    Subjective:    Patient ID: [name], female    DOB: 12-14-1955, 60 y.o.   MRN: [id number]  HPI: [name] is a 60 y.o. female presenting on 04/15/2016 for Depression; Insomnia; and Fatigue  Depression screen [facility] 2/9 04/15/2016 03/01/2016  Decreased Interest 3 3  Down, Depressed, Hopeless 3 3  PHQ - 2 Score 6 6  Altered sleeping 3 0  Tired, decreased energy 3 3  Change in appetite 3 3  Feeling bad or failure about yourself  3 1  Trouble concentrating 3 0  Moving slowly or fidgety/restless 2 0  Suicidal thoughts 0 0  PHQ-9 Score 23 13  Difficult doing work/chores Somewhat difficult Not difficult at all    Patient has not tolerated the Lexapro. She states that she felt a stat when she takes it. She has also tried Zoloft, Paxil, Wellbutrin, and Effexor in the past. She has not tried Cymbalta. In addition reducing the Seroquel to 100 mg has not helped her sleep. She would like that to be increased if possible. She is trying to use her Xanax as little as possible. She does not want to be on any type of pain medicine for her other conditions. I discussed that Cymbalta has been approved for use related to several somatic pain disorders. The patient would like to start. We will have her start it and recheck in one month. She is also having yellow thickening of the fingernail with some surrounding erythema. She does not know of any other fingernails or toenails that have any fungal infections.  Relevant past medical, surgical, family and social history reviewed and updated as indicated. Allergies and medications reviewed and updated.  Past Medical History:  Diagnosis Date  . Anxiety   . Arthritis   . Back pain, chronic   . Bipolar 1 disorder ([place])   . Chest pain    "STRESS"; had normal nuclear stress test 02/2011; denied recent history 02/10/14  .  Depression   . Difficulty sleeping    mind racing  . GERD (gastroesophageal reflux disease)   . Heart murmur    childhood  . Hiatal hernia   . History of stomach ulcers   . Hypercholesterolemia   . Hypertension   . Palpitations     Past Surgical History:  Procedure Laterality Date  . APPENDECTOMY    . BACK SURGERY    . CARPAL TUNNEL RELEASE     BILATERAL  . COLONOSCOPY, ESOPHAGOGASTRODUODENOSCOPY (EGD) AND ESOPHAGEAL DILATION N/A 02/06/2013   RMR. Benign colon polyp, diverticulosis, hemorrhoids. EGD with Schatzki's ring s/p 30 F dilation, 74mm elliptical prepyloric antral ulcer with adjacent scarring, negative path  . ESOPHAGEAL MANOMETRY N/A 09/04/2015   Procedure: ESOPHAGEAL MANOMETRY (EM);  Surgeon: Wilford Corner, MD;  [facility]: [facility];  Service: Endoscopy;  Laterality: N/A;  . ESOPHAGOGASTRODUODENOSCOPY N/A 09/23/2013   Dr. Gala Romney: non-critical Schatzki's ring, not manipulated, healed ulcer, gastric bx negative  . ESOPHAGOGASTRODUODENOSCOPY (EGD) WITH PROPOFOL N/A 08/10/2015   Procedure: ESOPHAGOGASTRODUODENOSCOPY (EGD) WITH PROPOFOL;  Surgeon: Daneil Dolin, MD;  [facility];  Service: Endoscopy;  Laterality: N/A;  0830  . INSERTION OF MESH N/A 10/02/2015   Procedure: INSERTION OF MESH;  Surgeon: [name], MD;  Location: [facility];  Service: General;  Laterality:  N/A;  . LUMBAR FUSION  02/12/2014   LEVEL 1 L4 L5    DR BROOKS  . MALONEY DILATION N/A 08/10/2015   Procedure: Venia Minks DILATION;  Surgeon: Daneil Dolin, MD;  [facility];  Service: Endoscopy;  Laterality: N/A;  . PARTIAL KNEE ARTHROPLASTY Left 07/15/2013   Procedure: UNICOMPARTMENTAL LEFT KNEE MEDIAL ;  Surgeon: [name], MD;  Location: [facility];  Service: Orthopedics;  Laterality: Left;, Right uni knee done also.    Review of Systems  Constitutional: Negative.   HENT: Negative.   Eyes: Negative.   Respiratory: Negative.   Gastrointestinal: Negative.   Genitourinary: Negative.     Psychiatric/Behavioral: Positive for decreased concentration, dysphoric mood and sleep disturbance. The patient is nervous/anxious.       Medication List       Accurate as of 04/15/16 10:31 AM. Always use your most recent med list.          ALPRAZolam 0.5 MG tablet Commonly known as:  XANAX Take 1 tablet (0.5 mg total) by mouth 2 (two) times daily as needed for anxiety.   amphetamine-dextroamphetamine 20 MG tablet Commonly known as:  ADDERALL Take 1 Tablet by mouth every morning AND TAKE ONE TABLET BY MOUTH AT NOON   atorvastatin 20 MG tablet Commonly known as:  LIPITOR Take 1 tablet (20 mg total) by mouth daily.   DULoxetine 30 MG capsule Commonly known as:  CYMBALTA Take 1 capsule (30 mg total) by mouth daily.   linaclotide 290 MCG Caps capsule Commonly known as:  LINZESS Take 1 capsule (290 mcg total) by mouth daily.   lisinopril 20 MG tablet Commonly known as:  PRINIVIL,ZESTRIL Take 20 mg by mouth every morning.   ondansetron 4 MG tablet Commonly known as:  ZOFRAN take 1  Tablet by mouth 3 times daily BEFORE meals   pantoprazole 40 MG tablet Commonly known as:  PROTONIX Take 1 tablet (40 mg total) by mouth daily. Take 30 minutes before breakfast daily.   QUEtiapine 300 MG tablet Commonly known as:  SEROQUEL Take 1 tablet (300 mg total) by mouth at bedtime.   TYLENOL ARTHRITIS PAIN 650 MG CR tablet Generic drug:  acetaminophen Take 650 mg by mouth every 8 (eight) hours as needed for pain.          Objective:    BP (!) 140/99   Pulse 94   Temp 98.8 F (37.1 C) (Oral)   Ht 5\' 2"  (1.575 m)   Wt 154 lb (69.9 kg)   BMI 28.17 kg/m   Allergies  Allergen Reactions  . Morphine And Related Nausea Only and Other (See Comments)    Dizziness     Physical Exam  Constitutional: She is oriented to person, place, and time. She appears well-developed and well-nourished.  HENT:  Head: Normocephalic and atraumatic.  Eyes: Conjunctivae and EOM are normal.  Pupils are equal, round, and reactive to light.  Cardiovascular: Normal rate, regular rhythm, normal heart sounds and intact distal pulses.   Pulmonary/Chest: Effort normal and breath sounds normal.  Abdominal: Soft. Bowel sounds are normal.  Neurological: She is alert and oriented to person, place, and time. She has normal reflexes.  Skin: Skin is warm and dry. No rash noted.  Psychiatric: She has a normal mood and affect. Her behavior is normal. Judgment and thought content normal.        Assessment & Plan:   1. GAD (generalized anxiety disorder) - ALPRAZolam (XANAX) 0.5 MG tablet; Take 1  tablet (0.5 mg total) by mouth 2 (two) times daily as needed for anxiety.  Dispense: 60 tablet; Refill: 1  2. Episodic mood disorder (HCC) - QUEtiapine (SEROQUEL) 300 MG tablet; Take 1 tablet (300 mg total) by mouth at bedtime.  Dispense: 30 tablet; Refill: 6 - DULoxetine (CYMBALTA) 30 MG capsule; Take 1 capsule (30 mg total) by mouth daily.  Dispense: 30 capsule; Refill: 1  3. Onychomycosis due to dermatophyte - terbinafine (LAMISIL) 250 MG tablet; Take 1 tablet (250 mg total) by mouth daily.  Dispense: 30 tablet; Refill: 1   Continue all other maintenance medications as listed above.  Follow up plan: Return in about 4 weeks (around 05/13/2016) for recheck meds.  Educational handout given for adjustment disorder  Terald Sleeper PA-C [facility] [address]  [place], [place] [address] [phone]   04/15/2016, 10:31 AM

## 2016-04-15 NOTE — Patient Instructions (Signed)
Adjustment Disorder Adjustment disorder is an unusually severe reaction to a stressful life event, such as the loss of a job or physical illness. The event may be any stressful event other than the loss of a loved one. Adjustment disorder may affect your feelings, your thinking, how you act, or a combination of these. It may interfere with personal relationships or with the way you are at work, school, or home. People with this disorder are at risk for suicide and substance abuse. They may develop a more serious mental disorder, such as major depressive disorder or post-traumatic stress disorder. SIGNS AND SYMPTOMS  Symptoms may include:  Sadness, depressed mood, or crying spells.  Loss of enjoyment.  Change in appetite or weight.  Sense of loss or hopelessness.  Thoughts of suicide.  Anxiety, worry, or nervousness.  Trouble sleeping.  Avoiding family and friends.  Poor school performance.  Fighting or vandalism.  Reckless driving.  Skipping school.  Poor work performance.  Ignoring bills. Symptoms of adjustment disorder start within 3 months of the stressful life event. They do not last more than 6 months after the event has ended. DIAGNOSIS  To make a diagnosis, your health care provider will ask about what has happened in your life and how it has affected you. He or she may also ask about your medical history and use of medicines, alcohol, and other substances. Your health care provider may do a physical exam and order lab tests or other studies. You may be referred to a mental health specialist for evaluation. TREATMENT  Treatment options include:  Counseling or talk therapy. Talk therapy is usually provided by mental health specialists.  Medicine. Certain medicines may help with depression, anxiety, and sleep.  Support groups. Support groups offer emotional support, advice, and guidance. They are made up of people who have had similar experiences. HOME CARE  INSTRUCTIONS  Keep all follow-up visits as directed by your health care provider. This is important.  Take medicines only as directed by your health care provider. SEEK MEDICAL CARE IF:  Your symptoms get worse.  SEEK IMMEDIATE MEDICAL CARE IF: You have serious thoughts about hurting yourself or someone else. MAKE SURE YOU:  Understand these instructions.  Will watch your condition.  Will get help right away if you are not doing well or get worse. This information is not intended to replace advice given to you by your health care provider. Make sure you discuss any questions you have with your health care provider. Document Released: 12/28/2005 Document Revised: 08/17/2015 Document Reviewed: 09/17/2013 Elsevier Interactive Patient Education  2017 Elsevier Inc.  

## 2016-05-10 ENCOUNTER — Other Ambulatory Visit: Payer: Self-pay | Admitting: Gastroenterology

## 2016-05-12 ENCOUNTER — Other Ambulatory Visit: Payer: Self-pay | Admitting: Physician Assistant

## 2016-05-12 DIAGNOSIS — B351 Tinea unguium: Secondary | ICD-10-CM

## 2016-05-12 DIAGNOSIS — F39 Unspecified mood [affective] disorder: Secondary | ICD-10-CM

## 2016-05-13 ENCOUNTER — Ambulatory Visit: Payer: Medicaid Other | Admitting: Physician Assistant

## 2016-05-16 ENCOUNTER — Encounter: Payer: Self-pay | Admitting: Family

## 2016-05-17 ENCOUNTER — Encounter: Payer: Self-pay | Admitting: Physician Assistant

## 2016-05-17 ENCOUNTER — Ambulatory Visit (INDEPENDENT_AMBULATORY_CARE_PROVIDER_SITE_OTHER): Payer: Medicaid Other | Admitting: Physician Assistant

## 2016-05-17 VITALS — BP 139/84 | HR 86 | Temp 98.6°F | Ht 62.0 in | Wt 148.0 lb

## 2016-05-17 DIAGNOSIS — F5101 Primary insomnia: Secondary | ICD-10-CM

## 2016-05-17 DIAGNOSIS — F411 Generalized anxiety disorder: Secondary | ICD-10-CM

## 2016-05-17 DIAGNOSIS — F39 Unspecified mood [affective] disorder: Secondary | ICD-10-CM

## 2016-05-17 MED ORDER — QUETIAPINE FUMARATE 400 MG PO TABS: 400.0000 mg | ORAL_TABLET | Freq: Every day | ORAL | 5 refills | Status: DC

## 2016-05-17 MED ORDER — DULOXETINE HCL 60 MG PO CPEP: 60.0000 mg | ORAL_CAPSULE | Freq: Every day | ORAL | 5 refills | Status: DC

## 2016-05-17 MED ORDER — ALPRAZOLAM 0.5 MG PO TABS: 0.5000 mg | ORAL_TABLET | Freq: Two times a day (BID) | ORAL | 3 refills | Status: DC | PRN

## 2016-05-17 NOTE — Progress Notes (Signed)
BP 139/84   Pulse 86   Temp 98.6 F (37 C) (Oral)   Ht 5\' 2"  (1.575 m)   Wt 148 lb (67.1 kg)   BMI 27.07 kg/m    Subjective:    Patient ID: [name], female    DOB: 10-20-1955, 60 y.o.   MRN: [id number]  HPI: [name] is a 61 y.o. female presenting on 05/17/2016 for Follow-up (4 week recheck. )  This patient comes in for periodic recheck on medications and conditions. All medications are reviewed today. There are no reports of any problems with the medications. All of the medical conditions are reviewed and updated.  Lab work is reviewed and will be ordered as medically necessary. There are no new problems reported with today's visit.  Still some insomnia and mind racing at night. She is willing to increase the Seroquel to 400 mg. And increase the Cymbalta to 60 mg particular since it is helping a lot of her joint pain. Depression screen [facility] 2/9 05/17/2016 04/15/2016 03/01/2016  Decreased Interest 3 3 3   Down, Depressed, Hopeless 3 3 3   PHQ - 2 Score 6 6 6   Altered sleeping 0 3 0  Tired, decreased energy 3 3 3   Change in appetite 2 3 3   Feeling bad or failure about yourself  0 3 1  Trouble concentrating 2 3 0  Moving slowly or fidgety/restless 0 2 0  Suicidal thoughts 0 0 0  PHQ-9 Score 13 23 13   Difficult doing work/chores Somewhat difficult Somewhat difficult Not difficult at all     Past Medical History:  Diagnosis Date  . Anxiety   . Arthritis   . Back pain, chronic   . Bipolar 1 disorder ([place])   . Chest pain    "STRESS"; had normal nuclear stress test 02/2011; denied recent history 02/10/14  . Depression   . Difficulty sleeping    mind racing  . GERD (gastroesophageal reflux disease)   . Heart murmur    childhood  . Hiatal hernia   . History of stomach ulcers   . Hypercholesterolemia   . Hypertension   . Palpitations    Relevant past medical, surgical, family and social history reviewed and updated as indicated. Interim medical history since our  last visit reviewed. Allergies and medications reviewed and updated. DATA REVIEWED: CHART IN EPIC  Social History   Social History  . Marital status: Single    Spouse name: N/A  . Number of children: 2  . Years of education: N/A   Occupational History  . Not on file.   Social History Main Topics  . Smoking status: Never Smoker  . Smokeless tobacco: Never Used     Comment: Never smoked  . Alcohol use No  . Drug use: No  . Sexual activity: No   Other Topics Concern  . Not on file   Social History Narrative  . No narrative on file    Past Surgical History:  Procedure Laterality Date  . APPENDECTOMY    . BACK SURGERY    . CARPAL TUNNEL RELEASE     BILATERAL  . COLONOSCOPY, ESOPHAGOGASTRODUODENOSCOPY (EGD) AND ESOPHAGEAL DILATION N/A 02/06/2013   RMR. Benign colon polyp, diverticulosis, hemorrhoids. EGD with Schatzki's ring s/p 69 F dilation, 44mm elliptical prepyloric antral ulcer with adjacent scarring, negative path  . ESOPHAGEAL MANOMETRY N/A 09/04/2015   Procedure: ESOPHAGEAL MANOMETRY (EM);  Surgeon: Wilford Corner, MD;  [facility];  Service: Endoscopy;  Laterality: N/A;  .  ESOPHAGOGASTRODUODENOSCOPY N/A 09/23/2013   Dr. Gala Romney: non-critical Schatzki's ring, not manipulated, healed ulcer, gastric bx negative  . ESOPHAGOGASTRODUODENOSCOPY (EGD) WITH PROPOFOL N/A 08/10/2015   Procedure: ESOPHAGOGASTRODUODENOSCOPY (EGD) WITH PROPOFOL;  Surgeon: Daneil Dolin, MD;  [facility];  Service: Endoscopy;  Laterality: N/A;  0830  . INSERTION OF MESH N/A 10/02/2015   Procedure: INSERTION OF MESH;  Surgeon: [name], MD;  [facility];  Service: General;  Laterality: N/A;  . LUMBAR FUSION  02/12/2014   LEVEL 1 L4 L5    DR BROOKS  . MALONEY DILATION N/A 08/10/2015   Procedure: Venia Minks DILATION;  Surgeon: Daneil Dolin, MD;  [facility];  Service: Endoscopy;  Laterality: N/A;  . PARTIAL KNEE ARTHROPLASTY Left 07/15/2013   Procedure:  UNICOMPARTMENTAL LEFT KNEE MEDIAL ;  Surgeon: [name], MD;  [facility];  Service: Orthopedics;  Laterality: Left;, Right uni knee done also.    Family History  Problem Relation Age of Onset  . Heart attack Mother   . Colon cancer Neg Hx     Review of Systems  Constitutional: Negative.  Negative for activity change, fatigue and fever.  HENT: Negative.   Eyes: Negative.   Respiratory: Negative.  Negative for cough.   Cardiovascular: Negative.  Negative for chest pain.  Gastrointestinal: Negative.  Negative for abdominal pain.  Endocrine: Negative.   Genitourinary: Negative.  Negative for dysuria.  Musculoskeletal: Negative.   Skin: Negative.   Neurological: Negative.   Psychiatric/Behavioral: Positive for decreased concentration. The patient is nervous/anxious.     Allergies as of 05/17/2016      Reactions   Morphine And Related Nausea Only, Other (See Comments)   Dizziness       Medication List       Accurate as of 05/17/16 11:40 AM. Always use your most recent med list.          ALPRAZolam 0.5 MG tablet Commonly known as:  XANAX Take 1 tablet (0.5 mg total) by mouth 2 (two) times daily as needed for anxiety.   amphetamine-dextroamphetamine 20 MG tablet Commonly known as:  ADDERALL Take 1 Tablet by mouth every morning AND TAKE ONE TABLET BY MOUTH AT NOON   atorvastatin 20 MG tablet Commonly known as:  LIPITOR Take 1 tablet (20 mg total) by mouth daily.   DULoxetine 60 MG capsule Commonly known as:  CYMBALTA Take 1 capsule (60 mg total) by mouth daily.   linaclotide 290 MCG Caps capsule Commonly known as:  LINZESS Take 1 capsule (290 mcg total) by mouth daily.   lisinopril 20 MG tablet Commonly known as:  PRINIVIL,ZESTRIL Take 20 mg by mouth every morning.   ondansetron 4 MG tablet Commonly known as:  ZOFRAN take 1  Tablet by mouth 3 times daily BEFORE meals   pantoprazole 40 MG tablet Commonly known as:  PROTONIX Take 1 tablet (40 mg total)  by mouth daily. Take 30 minutes before breakfast daily.   QUEtiapine 400 MG tablet Commonly known as:  SEROQUEL Take 1 tablet (400 mg total) by mouth at bedtime.   terbinafine 250 MG tablet Commonly known as:  LAMISIL Take 1 Tablet by mouth once daily   TYLENOL ARTHRITIS PAIN 650 MG CR tablet Generic drug:  acetaminophen Take 650 mg by mouth every 8 (eight) hours as needed for pain.          Objective:    BP 139/84   Pulse 86   Temp 98.6 F (37 C) (  Oral)   Ht 5\' 2"  (1.575 m)   Wt 148 lb (67.1 kg)   BMI 27.07 kg/m   Allergies  Allergen Reactions  . Morphine And Related Nausea Only and Other (See Comments)    Dizziness     Wt Readings from Last 3 Encounters:  05/17/16 148 lb (67.1 kg)  04/15/16 154 lb (69.9 kg)  03/29/16 144 lb 12.8 oz (65.7 kg)    Physical Exam  Constitutional: She is oriented to person, place, and time. She appears well-developed and well-nourished.  HENT:  Head: Normocephalic and atraumatic.  Eyes: Conjunctivae and EOM are normal. Pupils are equal, round, and reactive to light.  Cardiovascular: Normal rate, regular rhythm, normal heart sounds and intact distal pulses.   Pulmonary/Chest: Effort normal and breath sounds normal.  Abdominal: Soft. Bowel sounds are normal.  Neurological: She is alert and oriented to person, place, and time. She has normal reflexes.  Skin: Skin is warm and dry. No rash noted.  Psychiatric: She has a normal mood and affect. Her behavior is normal. Judgment and thought content normal.        Assessment & Plan:   1. Episodic mood disorder (HCC) - DULoxetine (CYMBALTA) 60 MG capsule; Take 1 capsule (60 mg total) by mouth daily.  Dispense: 30 capsule; Refill: 5 - QUEtiapine (SEROQUEL) 400 MG tablet; Take 1 tablet (400 mg total) by mouth at bedtime.  Dispense: 30 tablet; Refill: 5  2. Primary insomnia  3. GAD (generalized anxiety disorder) - ALPRAZolam (XANAX) 0.5 MG tablet; Take 1 tablet (0.5 mg total) by mouth 2  (two) times daily as needed for anxiety.  Dispense: 60 tablet; Refill: 3   Continue all other maintenance medications as listed above.  Follow up plan: Return in about 6 months (around 11/14/2016).  No orders of the defined types were placed in this encounter.   Educational handout given for insomnia  Terald Sleeper PA-C [facility] [address]  [place], [place] [address] [phone]   05/17/2016, 11:40 AM

## 2016-05-17 NOTE — Patient Instructions (Signed)
Insomnia Insomnia is a sleep disorder that makes it difficult to fall asleep or to stay asleep. Insomnia can cause tiredness (fatigue), low energy, difficulty concentrating, mood swings, and poor performance at work or school. There are three different ways to classify insomnia:  Difficulty falling asleep.  Difficulty staying asleep.  Waking up too early in the morning. Any type of insomnia can be long-term (chronic) or short-term (acute). Both are common. Short-term insomnia usually lasts for three months or less. Chronic insomnia occurs at least three times a week for longer than three months. What are the causes? Insomnia may be caused by another condition, situation, or substance, such as:  Anxiety.  Certain medicines.  Gastroesophageal reflux disease (GERD) or other gastrointestinal conditions.  Asthma or other breathing conditions.  Restless legs syndrome, sleep apnea, or other sleep disorders.  Chronic pain.  Menopause. This may include hot flashes.  Stroke.  Abuse of alcohol, tobacco, or illegal drugs.  Depression.  Caffeine.  Neurological disorders, such as Alzheimer disease.  An overactive thyroid (hyperthyroidism). The cause of insomnia may not be known. What increases the risk? Risk factors for insomnia include:  Gender. Women are more commonly affected than men.  Age. Insomnia is more common as you get older.  Stress. This may involve your professional or personal life.  Income. Insomnia is more common in people with lower income.  Lack of exercise.  Irregular work schedule or night shifts.  Traveling between different time zones. What are the signs or symptoms? If you have insomnia, trouble falling asleep or trouble staying asleep is the main symptom. This may lead to other symptoms, such as:  Feeling fatigued.  Feeling nervous about going to sleep.  Not feeling rested in the morning.  Having trouble concentrating.  Feeling irritable,  anxious, or depressed. How is this treated? Treatment for insomnia depends on the cause. If your insomnia is caused by an underlying condition, treatment will focus on addressing the condition. Treatment may also include:  Medicines to help you sleep.  Counseling or therapy.  Lifestyle adjustments. Follow these instructions at home:  Take medicines only as directed by your health care provider.  Keep regular sleeping and waking hours. Avoid naps.  Keep a sleep diary to help you and your health care provider figure out what could be causing your insomnia. Include:  When you sleep.  When you wake up during the night.  How well you sleep.  How rested you feel the next day.  Any side effects of medicines you are taking.  What you eat and drink.  Make your bedroom a comfortable place where it is easy to fall asleep:  Put up shades or special blackout curtains to block light from outside.  Use a white noise machine to block noise.  Keep the temperature cool.  Exercise regularly as directed by your health care provider. Avoid exercising right before bedtime.  Use relaxation techniques to manage stress. Ask your health care provider to suggest some techniques that may work well for you. These may include:  Breathing exercises.  Routines to release muscle tension.  Visualizing peaceful scenes.  Cut back on alcohol, caffeinated beverages, and cigarettes, especially close to bedtime. These can disrupt your sleep.  Do not overeat or eat spicy foods right before bedtime. This can lead to digestive discomfort that can make it hard for you to sleep.  Limit screen use before bedtime. This includes:  Watching TV.  Using your smartphone, tablet, and computer.  Stick to a   routine. This can help you fall asleep faster. Try to do a quiet activity, brush your teeth, and go to bed at the same time each night.  Get out of bed if you are still awake after 15 minutes of trying to  sleep. Keep the lights down, but try reading or doing a quiet activity. When you feel sleepy, go back to bed.  Make sure that you drive carefully. Avoid driving if you feel very sleepy.  Keep all follow-up appointments as directed by your health care provider. This is important. Contact a health care provider if:  You are tired throughout the day or have trouble in your daily routine due to sleepiness.  You continue to have sleep problems or your sleep problems get worse. Get help right away if:  You have serious thoughts about hurting yourself or someone else. This information is not intended to replace advice given to you by your health care provider. Make sure you discuss any questions you have with your health care provider. Document Released: 04/22/2000 Document Revised: 09/25/2015 Document Reviewed: 01/24/2014 Elsevier Interactive Patient Education  2017 Elsevier Inc.  

## 2016-06-03 ENCOUNTER — Other Ambulatory Visit: Payer: Self-pay | Admitting: Physician Assistant

## 2016-06-10 ENCOUNTER — Telehealth: Payer: Self-pay | Admitting: Physician Assistant

## 2016-06-10 MED ORDER — TRAZODONE HCL 100 MG PO TABS: ORAL_TABLET | ORAL | 1 refills | Status: DC

## 2016-06-10 NOTE — Telephone Encounter (Signed)
We can add trazodone 100mg  1-2 QHS for sleep #60 1 refill, follow up 2 months.

## 2016-06-10 NOTE — Telephone Encounter (Signed)
Patient states that she is waking up around 2-3am and is unable to go back to sleep. Then she states that she is taking another half of a seroquel to try and go back to sleep. Please advise

## 2016-06-14 ENCOUNTER — Telehealth: Payer: Self-pay | Admitting: Physician Assistant

## 2016-06-14 NOTE — Telephone Encounter (Signed)
Changed to 50 mg tablet 2-4 QHS

## 2016-06-14 NOTE — Telephone Encounter (Signed)
Patient aware of recommendation.  

## 2016-06-14 NOTE — Telephone Encounter (Signed)
Patient states that the seroquel is not keeping her asleep. Patient states that she wakes up between 1-3 and can not go back to sleep. Please advise

## 2016-06-14 NOTE — Telephone Encounter (Signed)
Add trazodone 100 mg 1-2 QHS #60 2 refills. Keep regular follow up.

## 2016-07-06 ENCOUNTER — Encounter: Payer: Medicaid Other | Admitting: *Deleted

## 2016-07-18 ENCOUNTER — Encounter: Payer: Medicaid Other | Admitting: *Deleted

## 2016-07-27 ENCOUNTER — Other Ambulatory Visit: Payer: Self-pay | Admitting: Physician Assistant

## 2016-09-26 ENCOUNTER — Ambulatory Visit (INDEPENDENT_AMBULATORY_CARE_PROVIDER_SITE_OTHER): Payer: Medicaid Other | Admitting: Family

## 2016-09-26 ENCOUNTER — Encounter: Payer: Self-pay | Admitting: Family

## 2016-09-26 ENCOUNTER — Ambulatory Visit (INDEPENDENT_AMBULATORY_CARE_PROVIDER_SITE_OTHER): Payer: Medicaid Other

## 2016-09-26 ENCOUNTER — Other Ambulatory Visit: Payer: Self-pay | Admitting: Physician Assistant

## 2016-09-26 VITALS — BP 135/99 | HR 105 | Temp 99.8°F | Ht 62.0 in | Wt 149.0 lb

## 2016-09-26 DIAGNOSIS — F411 Generalized anxiety disorder: Secondary | ICD-10-CM | POA: Diagnosis not present

## 2016-09-26 DIAGNOSIS — Z780 Asymptomatic menopausal state: Secondary | ICD-10-CM

## 2016-09-26 DIAGNOSIS — K5900 Constipation, unspecified: Secondary | ICD-10-CM | POA: Diagnosis not present

## 2016-09-26 DIAGNOSIS — I10 Essential (primary) hypertension: Secondary | ICD-10-CM

## 2016-09-26 DIAGNOSIS — M545 Low back pain: Secondary | ICD-10-CM

## 2016-09-26 DIAGNOSIS — G8929 Other chronic pain: Secondary | ICD-10-CM | POA: Diagnosis not present

## 2016-09-26 DIAGNOSIS — E782 Mixed hyperlipidemia: Secondary | ICD-10-CM

## 2016-09-26 NOTE — Patient Instructions (Signed)
Health Maintenance, Female Adopting a healthy lifestyle and getting preventive care can go a long way to promote health and wellness. Talk with your health care provider about what schedule of regular examinations is right for you. This is a good chance for you to check in with your provider about disease prevention and staying healthy. In between checkups, there are plenty of things you can do on your own. Experts have done a lot of research about which lifestyle changes and preventive measures are most likely to keep you healthy. Ask your health care provider for more information. Weight and diet Eat a healthy diet  Be sure to include plenty of vegetables, fruits, low-fat dairy products, and lean protein.  Do not eat a lot of foods high in solid fats, added sugars, or salt.  Get regular exercise. This is one of the most important things you can do for your health.  Most adults should exercise for at least 150 minutes each week. The exercise should increase your heart rate and make you sweat (moderate-intensity exercise).  Most adults should also do strengthening exercises at least twice a week. This is in addition to the moderate-intensity exercise. Maintain a healthy weight  Body mass index (BMI) is a measurement that can be used to identify possible weight problems. It estimates body fat based on height and weight. Your health care provider can help determine your BMI and help you achieve or maintain a healthy weight.  For females 76 years of age and older:  A BMI below 18.5 is considered underweight.  A BMI of 18.5 to 24.9 is normal.  A BMI of 25 to 29.9 is considered overweight.  A BMI of 30 and above is considered obese. Watch levels of cholesterol and blood lipids  You should start having your blood tested for lipids and cholesterol at 60 years of age, then have this test every 5 years.  You may need to have your cholesterol levels checked more often if:  Your lipid or  cholesterol levels are high.  You are older than 61 years of age.  You are at high risk for heart disease. Cancer screening Lung Cancer  Lung cancer screening is recommended for adults 64-42 years old who are at high risk for lung cancer because of a history of smoking.  A yearly low-dose CT scan of the lungs is recommended for people who:  Currently smoke.  Have quit within the past 15 years.  Have at least a 30-pack-year history of smoking. A pack year is smoking an average of one pack of cigarettes a day for 1 year.  Yearly screening should continue until it has been 15 years since you quit.  Yearly screening should stop if you develop a health problem that would prevent you from having lung cancer treatment. Breast Cancer  Practice breast self-awareness. This means understanding how your breasts normally appear and feel.  It also means doing regular breast self-exams. Let your health care provider know about any changes, no matter how small.  If you are in your 20s or 30s, you should have a clinical breast exam (CBE) by a health care provider every 1-3 years as part of a regular health exam.  If you are 34 or older, have a CBE every year. Also consider having a breast X-ray (mammogram) every year.  If you have a family history of breast cancer, talk to your health care provider about genetic screening.  If you are at high risk for breast cancer, talk  to your health care provider about having an MRI and a mammogram every year.  Breast cancer gene (BRCA) assessment is recommended for women who have family members with BRCA-related cancers. BRCA-related cancers include:  Breast.  Ovarian.  Tubal.  Peritoneal cancers.  Results of the assessment will determine the need for genetic counseling and BRCA1 and BRCA2 testing. Cervical Cancer  Your health care provider may recommend that you be screened regularly for cancer of the pelvic organs (ovaries, uterus, and vagina).  This screening involves a pelvic examination, including checking for microscopic changes to the surface of your cervix (Pap test). You may be encouraged to have this screening done every 3 years, beginning at age 24.  For women ages 66-65, health care providers may recommend pelvic exams and Pap testing every 3 years, or they may recommend the Pap and pelvic exam, combined with testing for human papilloma virus (HPV), every 5 years. Some types of HPV increase your risk of cervical cancer. Testing for HPV may also be done on women of any age with unclear Pap test results.  Other health care providers may not recommend any screening for nonpregnant women who are considered low risk for pelvic cancer and who do not have symptoms. Ask your health care provider if a screening pelvic exam is right for you.  If you have had past treatment for cervical cancer or a condition that could lead to cancer, you need Pap tests and screening for cancer for at least 20 years after your treatment. If Pap tests have been discontinued, your risk factors (such as having a new sexual partner) need to be reassessed to determine if screening should resume. Some women have medical problems that increase the chance of getting cervical cancer. In these cases, your health care provider may recommend more frequent screening and Pap tests. Colorectal Cancer  This type of cancer can be detected and often prevented.  Routine colorectal cancer screening usually begins at 61 years of age and continues through 61 years of age.  Your health care provider may recommend screening at an earlier age if you have risk factors for colon cancer.  Your health care provider may also recommend using home test kits to check for hidden blood in the stool.  A small camera at the end of a tube can be used to examine your colon directly (sigmoidoscopy or colonoscopy). This is done to check for the earliest forms of colorectal cancer.  Routine  screening usually begins at age 41.  Direct examination of the colon should be repeated every 5-10 years through 61 years of age. However, you may need to be screened more often if early forms of precancerous polyps or small growths are found. Skin Cancer  Check your skin from head to toe regularly.  Tell your health care provider about any new moles or changes in moles, especially if there is a change in a mole's shape or color.  Also tell your health care provider if you have a mole that is larger than the size of a pencil eraser.  Always use sunscreen. Apply sunscreen liberally and repeatedly throughout the day.  Protect yourself by wearing long sleeves, pants, a wide-brimmed hat, and sunglasses whenever you are outside. Heart disease, diabetes, and high blood pressure  High blood pressure causes heart disease and increases the risk of stroke. High blood pressure is more likely to develop in:  People who have blood pressure in the high end of the normal range (130-139/85-89 mm Hg).  People who are overweight or obese.  People who are African American.  If you are 59-24 years of age, have your blood pressure checked every 3-5 years. If you are 34 years of age or older, have your blood pressure checked every year. You should have your blood pressure measured twice-once when you are at a hospital or clinic, and once when you are not at a hospital or clinic. Record the average of the two measurements. To check your blood pressure when you are not at a hospital or clinic, you can use:  An automated blood pressure machine at a pharmacy.  A home blood pressure monitor.  If you are between 29 years and 60 years old, ask your health care provider if you should take aspirin to prevent strokes.  Have regular diabetes screenings. This involves taking a blood sample to check your fasting blood sugar level.  If you are at a normal weight and have a low risk for diabetes, have this test once  every three years after 61 years of age.  If you are overweight and have a high risk for diabetes, consider being tested at a younger age or more often. Preventing infection Hepatitis B  If you have a higher risk for hepatitis B, you should be screened for this virus. You are considered at high risk for hepatitis B if:  You were born in a country where hepatitis B is common. Ask your health care provider which countries are considered high risk.  Your parents were born in a high-risk country, and you have not been immunized against hepatitis B (hepatitis B vaccine).  You have HIV or AIDS.  You use needles to inject street drugs.  You live with someone who has hepatitis B.  You have had sex with someone who has hepatitis B.  You get hemodialysis treatment.  You take certain medicines for conditions, including cancer, organ transplantation, and autoimmune conditions. Hepatitis C  Blood testing is recommended for:  Everyone born from [birth year] through 1965.  Anyone with known risk factors for hepatitis C. Sexually transmitted infections (STIs)  You should be screened for sexually transmitted infections (STIs) including gonorrhea and chlamydia if:  You are sexually active and are younger than 61 years of age.  You are older than 61 years of age and your health care provider tells you that you are at risk for this type of infection.  Your sexual activity has changed since you were last screened and you are at an increased risk for chlamydia or gonorrhea. Ask your health care provider if you are at risk.  If you do not have HIV, but are at risk, it may be recommended that you take a prescription medicine daily to prevent HIV infection. This is called pre-exposure prophylaxis (PrEP). You are considered at risk if:  You are sexually active and do not regularly use condoms or know the HIV status of your partner(s).  You take drugs by injection.  You are sexually active with a partner  who has HIV. Talk with your health care provider about whether you are at high risk of being infected with HIV. If you choose to begin PrEP, you should first be tested for HIV. You should then be tested every 3 months for as long as you are taking PrEP. Pregnancy  If you are premenopausal and you may become pregnant, ask your health care provider about preconception counseling.  If you may become pregnant, take 400 to 800 micrograms (mcg) of folic acid  every day.  If you want to prevent pregnancy, talk to your health care provider about birth control (contraception). Osteoporosis and menopause  Osteoporosis is a disease in which the bones lose minerals and strength with aging. This can result in serious bone fractures. Your risk for osteoporosis can be identified using a bone density scan.  If you are 4 years of age or older, or if you are at risk for osteoporosis and fractures, ask your health care provider if you should be screened.  Ask your health care provider whether you should take a calcium or vitamin D supplement to lower your risk for osteoporosis.  Menopause may have certain physical symptoms and risks.  Hormone replacement therapy may reduce some of these symptoms and risks. Talk to your health care provider about whether hormone replacement therapy is right for you. Follow these instructions at home:  Schedule regular health, dental, and eye exams.  Stay current with your immunizations.  Do not use any tobacco products including cigarettes, chewing tobacco, or electronic cigarettes.  If you are pregnant, do not drink alcohol.  If you are breastfeeding, limit how much and how often you drink alcohol.  Limit alcohol intake to no more than 1 drink per day for nonpregnant women. One drink equals 12 ounces of beer, 5 ounces of wine, or 1 ounces of hard liquor.  Do not use street drugs.  Do not share needles.  Ask your health care provider for help if you need support  or information about quitting drugs.  Tell your health care provider if you often feel depressed.  Tell your health care provider if you have ever been abused or do not feel safe at home. This information is not intended to replace advice given to you by your health care provider. Make sure you discuss any questions you have with your health care provider. Document Released: 11/08/2010 Document Revised: 10/01/2015 Document Reviewed: 01/27/2015 Elsevier Interactive Patient Education  2017 [employer].

## 2016-09-26 NOTE — Progress Notes (Signed)
Subjective:    Patient ID: [name], female    DOB: 09-08-1955, 61 y.o.   MRN: [id number]  PT presents to the office today for chronic follow up.  Anxiety  Presents for follow-up visit. Symptoms include depressed mood, excessive worry, irritability and nervous/anxious behavior. Patient reports no palpitations or shortness of breath. Symptoms occur occasionally.    Hypertension  This is a chronic problem. The current episode started more than 1 year ago. The problem has been waxing and waning since onset. The problem is uncontrolled. Associated symptoms include anxiety and malaise/fatigue. Pertinent negatives include no palpitations, peripheral edema or shortness of breath. Risk factors for coronary artery disease include dyslipidemia, obesity and sedentary lifestyle. The current treatment provides mild improvement. There is no history of kidney disease, CAD/MI, CVA or heart failure.  Hyperlipidemia  This is a chronic problem. The current episode started more than 1 year ago. The problem is uncontrolled. Recent lipid tests were reviewed and are high. Exacerbating diseases include obesity. Pertinent negatives include no shortness of breath. Current antihyperlipidemic treatment includes statins. The current treatment provides mild improvement of lipids. Risk factors for coronary artery disease include dyslipidemia, hypertension, post-menopausal and a sedentary lifestyle.  Back Pain  This is a chronic problem. The problem occurs intermittently. The problem has been waxing and waning since onset. The quality of the pain is described as aching. The pain is at a severity of 4/10. She has tried analgesics and bed rest for the symptoms.  Constipation  This is a chronic problem. The current episode started more than 1 year ago. The problem has been resolved since onset. Her stool frequency is 1 time per day. Associated symptoms include back pain. Treatments tried: linzess. The treatment provided  moderate relief.      Review of Systems  Constitutional: Positive for irritability and malaise/fatigue.  Respiratory: Negative for shortness of breath.   Cardiovascular: Negative for palpitations.  Gastrointestinal: Positive for constipation.  Musculoskeletal: Positive for back pain.  Psychiatric/Behavioral: The patient is nervous/anxious.   All other systems reviewed and are negative.      Objective:   Physical Exam  Constitutional: She is oriented to person, place, and time. She appears well-developed and well-nourished. No distress.  HENT:  Head: Normocephalic and atraumatic.  Right Ear: External ear normal.  Left Ear: External ear normal.  Nose: Nose normal.  Mouth/Throat: Oropharynx is clear and moist.  Eyes: Pupils are equal, round, and reactive to light.  Neck: Normal range of motion. Neck supple. No thyromegaly present.  Cardiovascular: Normal rate, regular rhythm, normal heart sounds and intact distal pulses.   No murmur heard. Pulmonary/Chest: Effort normal and breath sounds normal. No respiratory distress. She has no wheezes.  Abdominal: Soft. Bowel sounds are normal. She exhibits no distension. There is no tenderness.  Musculoskeletal: Normal range of motion. She exhibits no edema or tenderness.  Neurological: She is alert and oriented to person, place, and time.  Skin: Skin is warm and dry.  Psychiatric: She has a normal mood and affect. Her behavior is normal. Judgment and thought content normal.  Vitals reviewed.     BP (!) 135/99   Pulse (!) 105   Temp 99.8 F (37.7 C) (Oral)   Ht _0  (1.575 m)   Wt 149 lb (67.6 kg)   BMI 27.25 kg/m      Assessment & Plan:  1. Essential hypertension - CMP14+EGFR  2. Mixed hyperlipidemia - CMP14+EGFR - Lipid panel  3. GAD (generalized anxiety  disorder) - CMP14+EGFR  4. Chronic bilateral low back pain, with sciatica presence unspecified - CMP14+EGFR  5. Constipation, unspecified constipation type -  CMP14+EGFR  6. Post-menopause - CMP14+EGFR - DG [facility] DEXA   Continue all meds Labs pending Health Maintenance reviewed Diet and exercise encouraged RTO as needed and keep follow up with PCP  Evelina Dun, FNP

## 2016-09-27 ENCOUNTER — Other Ambulatory Visit: Payer: Self-pay | Admitting: Family

## 2016-09-27 LAB — LIPID PANEL
CHOLESTEROL TOTAL: 233 mg/dL — AB (ref 100–199)
Chol/HDL Ratio: 3.3 ratio (ref 0.0–4.4)
HDL: 70 mg/dL (ref >39)
LDL Calculated: 144 mg/dL — ABNORMAL HIGH (ref 0–99)
Triglycerides: 96 mg/dL (ref 0–149)
VLDL CHOLESTEROL CAL: 19 mg/dL (ref 5–40)

## 2016-09-27 LAB — CMP14+EGFR
ALK PHOS: 112 IU/L (ref 39–117)
ALT: 10 IU/L (ref 0–32)
AST: 17 IU/L (ref 0–40)
Albumin/Globulin Ratio: 1.6 (ref 1.2–2.2)
Albumin: 4.2 g/dL (ref 3.6–4.8)
BILIRUBIN TOTAL: 0.3 mg/dL (ref 0.0–1.2)
BUN/Creatinine Ratio: 14 (ref 12–28)
BUN: 13 mg/dL (ref 8–27)
CHLORIDE: 102 mmol/L (ref 96–106)
CO2: 28 mmol/L (ref 18–29)
Calcium: 9.7 mg/dL (ref 8.7–10.3)
Creatinine, Ser: 0.95 mg/dL (ref 0.57–1.00)
GFR calc non Af Amer: 65 mL/min/{1.73_m2} (ref >59)
GFR, EST AFRICAN AMERICAN: 75 mL/min/{1.73_m2} (ref >59)
GLUCOSE: 81 mg/dL (ref 65–99)
Globulin, Total: 2.7 g/dL (ref 1.5–4.5)
POTASSIUM: 4.1 mmol/L (ref 3.5–5.2)
Sodium: 142 mmol/L (ref 134–144)
Total Protein: 6.9 g/dL (ref 6.0–8.5)

## 2016-10-06 ENCOUNTER — Ambulatory Visit (INDEPENDENT_AMBULATORY_CARE_PROVIDER_SITE_OTHER): Payer: Medicaid Other | Admitting: Pharmacist

## 2016-10-06 DIAGNOSIS — M8589 Other specified disorders of bone density and structure, multiple sites: Secondary | ICD-10-CM | POA: Diagnosis not present

## 2016-10-06 NOTE — Progress Notes (Signed)
Patient ID: [name], female   DOB: [birth year], 61 y.o.   MRN: [id number]     HPI: Patient is here to review DEXA - this is her first DEXA Back Pain?  Yes  - occasionally. Patient had back surgery 2017 Kyphosis?  No Prior fracture?  No Med(s) for Osteoporosis/Osteopenia:  none Med(s) previously tried for Osteoporosis/Osteopenia:  none                                                             PMH: Age at menopause:  Mid 60's Hysterectomy?  No Oophorectomy?  No HRT? No Steroid Use?  No Thyroid med?  No History of cancer?  No History of digestive disorders (ie Crohn's)?  Yes Current or previous eating disorders?  Yes - history of peptic ulcer about 3 years ago (cuase thought to be taking Theophilus Bones) and GERD Last Vitamin D Result:  No result on record Last GFR Result:  65 (09/26/2016)   FH/SH: Family history of osteoporosis?  No Parent with history of hip fracture?  No Family history of breast cancer?  No Exercise?  Yes - walking about 2 miles - about 3 days per week Smoking?  No Alcohol?  No    Calcium Assessment Calcium Intake  # of servings/day  Calcium mg  Milk (8 oz) 0.5  x  300  = 150mg   Yogurt / Cottage Cheese (4 oz) 0.5 x  200 = 100mg   Cheese (1 oz) 1 x  200 = 200mg   Other Calcium sources   250mg   Ca supplement 0 = 0   Estimated calcium intake per day 800mg     DEXA Results Date of Test T-Score for Neck Left Hip T-Score for Left Forearm Radius  09/26/2016 -2.4 -2.3               FRAX 10 year estimate: Total FX risk:  11.5  (consider medication if >/= 20%) Hip FX risk:  2.0  (consider medication if >/= 3%)  Assessment: Osteopenia with low fracture risk  Recommendations: 1.   Discussed BMD  / DEXA results and discussed fracture risk. Recommend if she does start something in future to consider Reclast or Prolia due to history of peptic ulcer 2.  recommend calcium 1200mg  daily through supplementation or diet.  3.  recommend weight bearing exercise  - 30 minutes at least 4 days per week.   4.  Counseled and educated about fall risk and prevention. 5.  Recommended patient have vitamin D level checked today but she declined because she does not like to have blood drawn.  Place note for it to be done at next PCP visit with other labs  Recheck DEXA:  2 years  Time spent counseling patient:  30 minutes

## 2016-10-06 NOTE — Patient Instructions (Signed)
Recommend getting '400mg'$  more of calcium per day either through foods like almond milk ('450mg'$  per 8 ounces) or other calcium rich foods (see below) or take calcium citrate 400 to '500mg'$  per day  Continue to walk and do other weight bearing exercises.   Calcium & Vitamin D: The Facts  Why is calcium and vitamin D consumption important? Calcium: . Most Americans do not consume adequate amounts of calcium! Calcium is required for proper muscle function, nerve communication, bone support, and many other functions in the body.  . The body uses bones as a source of calcium. Bones 'remodel' themselves continuously - the body constantly breaks bone down to release calcium and rebuilds bones by replacing calcium in the bone later.  . As we get older, the rate of bone breakdown occurs faster than bone rebuilding which could lead to osteopenia, osteoporosis, and possible fractures.   Vitamin D: . People naturally make vitamin D in the body when sunlight hits the skin and triggers a process that leads to vitamin D production. This natural vitamin D production requires about 10-15 minutes of sun exposure on the hands, arms, and face at least 2-3 times per week. However, due to decreased sun exposure and the use of sunscreen, most people will need to get additional vitamin D from foods or supplements. Your doctor can measure your body's vitamin D level through a simple blood test to determine your daily vitamin D needs.  . Vitamin D is used to help the body absorb calcium, maintain bone health, help the immune system, and reduce inflammation. It also plays a role in muscle performance, balance and risk of falling.  . Vitamin D deficiency can lead to osteomalacia or softening of the bones, bone pain, and muscle weakness.   The recommended daily allowance of Calcium and Vitamin D varies for different age groups. Age group Calcium (mg) Vitamin D (IU)  Females and Males: Age 42-50 1000 mg 600 IU  Females: Age 35- 71  1200 mg 600 IU  Males: Age 83-70 1000 mg 600 IU  Females and Males: Age 83+ 1200 mg 800 IU  Pregnant/lactating Females age 64-50 1000 mg 600 IU   How much Calcium do you get in your diet? Calcium Intake # of servings per day  Total calcium (mg)  Skim milk, 2% milk (1 cup) _________ x 300 mg   Yogurt (1 small container) _________ x 200 mg   Cheese (1oz) _________ x 200 mg   Cottage Cheese (1 cup)             ________ x 150 mg   Almond milk (1 cup) _________ x 450 mg   Fortified Orange Juice (1 cup) _________ x 300 mg   Broccoli or spinach ( 1 cup) _________ x 100 mg   Salmon (3 oz) _________ x 150 mg    Almonds (1/4 cup) _______ x 90 mg      How do we get Calcium and Vitamin D in our diet? Calcium: . Obtaining calcium from the diet is the most preferred way to reach the recommended daily goal. If this goal is not reached through diet, calcium supplements are available.  . Calcium is found in many foods including: dairy products, dark leafy vegetables (like broccoli, kale, and spinach), fish, and fortified products like juices and cereals.  . The food label will have a %DV (percent daily value) listed showing the amount of calcium per serving. To determine the total mg per serving, simply replace the %  with zero (0).  For example, Almond Breeze almond milk contains 45% DV of calcium or '450mg'$  per 1 cup.  . You can increase the amount of calcium in your diet by using more calcium products in your daily meals. Use yogurt and fruit to make smoothies or use yogurt to top baked potatoes or make whipped potatoes. Sprinkle low fat cheese onto salads or into egg white omelets. You can even add non-fat dry milk powder ('300mg'$  calcium per 1/3 cup) to hot cereals, meat loaf, soups, or potatoes.  . Calcium supplements come in many forms including tablets, chewables, and gummies. Be sure to read the label to determine the correct number of tablets per serving and whether or not to take the supplement with  food.  . Calcium carbonate products (Oscal, Caltrate, and Viactiv) are generally better absorbed when taken with food while calcium citrate products like Citracal can be taken with or without food.  . The body can only absorb about 600 mg of calcium at one time. It is recommended to take calcium supplements in small amounts several times per day.  However, taking it all at once is better than not taking it at all. . Increasing your intake of calcium is essential for bone health, but may also lead to some side effects like constipation, increased gas, bloating or abdominal cramping. To help reduce these side effects, start with 1 tablet per day and slowly increase your intake of the supplement to the recommended doses. It is also recommended that you drink plenty of water each day. Vitamin D: . Very few foods naturally contain vitamin D. However, it is found in saltwater fish (like tuna, salmon and mackerel), beef liver, egg yolks, cheese and vitamin D fortified foods (like yogurt, cereals, orange juice and milk) . The amount of vitamin D in each food or product is listed as %DV on the product label. To determine the total amount of vitamin D per serving, drop the % sign and multiply the number by 4. For example, 1 cup of Almond Breeze almond milk contains 25% DV vitamin D or 100 IU per serving (25 x 4 =100). . Vitamin D is also found in multivitamins and supplements and may be listed as ergocalciferol (vitamin D2) or cholecalciferol (vitamin D3). Each of these forms of vitamin D are equivalent and the daily recommended intake will vary based on your age and the vitamin D levels in your body. Follow your doctor's recommendation for vitamin D intake.                    Exercise for Strong Bones  Exercise is important to build and maintain strong bones / bone density.  There are 2 types of exercises that are important to building and maintaining strong bones:  Weight- bearing and  muscle-stregthening.  Weight-bearing Exercises  These exercises include activities that make you move against gravity while staying upright. Weight-bearing exercises can be high-impact or low-impact.  High-impact weight-bearing exercises help build bones and keep them strong. If you have broken a bone due to osteoporosis or are at risk of breaking a bone, you may need to avoid high-impact exercises. If you're not sure, you should check with your healthcare provider.  Examples of high-impact weight-bearing exercises are: Dancing  Doing high-impact aerobics  Hiking  Jogging/running  Jumping Rope  Stair climbing  Tennis  Low-impact weight-bearing exercises can also help keep bones strong and are a safe alternative if you cannot do high-impact exercises.  Examples of low-impact weight-bearing exercises are: Using elliptical training machines  Doing low-impact aerobics  Using stair-step machines  Fast walking on a treadmill or outside   Muscle-Strengthening Exercises These exercises include activities where you move your body, a weight or some other resistance against gravity. They are also known as resistance exercises and include: Lifting weights  Using elastic exercise bands  Using weight machines  Lifting your own body weight  Functional movements, such as standing and rising up on your toes  Yoga and Pilates can also improve strength, balance and flexibility. However, certain positions may not be safe for people with osteoporosis or those at increased risk of broken bones. For example, exercises that have you bend forward may increase the chance of breaking a bone in the spine.   Non-Impact Exercises There are other types of exercises that can help prevent falls.  Non-impact exercises can help you to improve balance, posture and how well you move in everyday activities. Some of these exercises include: Balance exercises that strengthen your legs and test your balance, such as  Tai Chi, can decrease your risk of falls.  Posture exercises that improve your posture and reduce rounded or "sloping" shoulders can help you decrease the chance of breaking a bone, especially in the spine.  Functional exercises that improve how well you move can help you with everyday activities and decrease your chance of falling and breaking a bone. For example, if you have trouble getting up from a chair or climbing stairs, you should do these activities as exercises.   **A physical therapist can teach you balance, posture and functional exercises. He/she can also help you learn which exercises are safe and appropriate for you.  [facility] has a physical therapy office in [place] in front of our office and referrals can be made for assessments and treatment as needed and strength and balance training.  If you would like to have an assessment with [facility] and our physical therapy team please let a nurse or provider know.   Fall Prevention in the Home Falls can cause injuries and can affect people from all age groups. There are many simple things that you can do to make your home safe and to help prevent falls. What can I do on the outside of my home?  Regularly repair the edges of walkways and driveways and fix any cracks.  Remove high doorway thresholds.  Trim any shrubbery on the main path into your home.  Use bright outdoor lighting.  Clear walkways of debris and clutter, including tools and rocks.  Regularly check that handrails are securely fastened and in good repair. Both sides of any steps should have handrails.  Install guardrails along the edges of any raised decks or porches.  Have leaves, snow, and ice cleared regularly.  Use sand or salt on walkways during winter months.  In the garage, clean up any spills right away, including grease or oil spills. What can I do in the bathroom?  Use night lights.  Install grab bars by the toilet and in the tub and shower. Do not  use towel bars as grab bars.  Use non-skid mats or decals on the floor of the tub or shower.  If you need to sit down while you are in the shower, use a plastic, non-slip stool.  Keep the floor dry. Immediately clean up any water that spills on the floor.  Remove soap buildup in the tub or shower on a regular basis.  Attach  bath mats securely with double-sided non-slip rug tape.  Remove throw rugs and other tripping hazards from the floor. What can I do in the bedroom?  Use night lights.  Make sure that a bedside light is easy to reach.  Do not use oversized bedding that drapes onto the floor.  Have a firm chair that has side arms to use for getting dressed.  Remove throw rugs and other tripping hazards from the floor. What can I do in the kitchen?  Clean up any spills right away.  Avoid walking on wet floors.  Place frequently used items in easy-to-reach places.  If you need to reach for something above you, use a sturdy step stool that has a grab bar.  Keep electrical cables out of the way.  Do not use floor polish or wax that makes floors slippery. If you have to use wax, make sure that it is non-skid floor wax.  Remove throw rugs and other tripping hazards from the floor. What can I do in the stairways?  Do not leave any items on the stairs.  Make sure that there are handrails on both sides of the stairs. Fix handrails that are broken or loose. Make sure that handrails are as long as the stairways.  Check any carpeting to make sure that it is firmly attached to the stairs. Fix any carpet that is loose or worn.  Avoid having throw rugs at the top or bottom of stairways, or secure the rugs with carpet tape to prevent them from moving.  Make sure that you have a light switch at the top of the stairs and the bottom of the stairs. If you do not have them, have them installed. What are some other fall prevention tips?  Wear closed-toe shoes that fit well and support  your feet. Wear shoes that have rubber soles or low heels.  When you use a stepladder, make sure that it is completely opened and that the sides are firmly locked. Have someone hold the ladder while you are using it. Do not climb a closed stepladder.  Add color or contrast paint or tape to grab bars and handrails in your home. Place contrasting color strips on the first and last steps.  Use mobility aids as needed, such as canes, walkers, scooters, and crutches.  Turn on lights if it is dark. Replace any light bulbs that burn out.  Set up furniture so that there are clear paths. Keep the furniture in the same spot.  Fix any uneven floor surfaces.  Choose a carpet design that does not hide the edge of steps of a stairway.  Be aware of any and all pets.  Review your medicines with your healthcare provider. Some medicines can cause dizziness or changes in blood pressure, which increase your risk of falling. Talk with your health care provider about other ways that you can decrease your risk of falls. This may include working with a physical therapist or trainer to improve your strength, balance, and endurance. This information is not intended to replace advice given to you by your health care provider. Make sure you discuss any questions you have with your health care provider. Document Released: 04/15/2002 Document Revised: 09/22/2015 Document Reviewed: 05/30/2014 Elsevier Interactive Patient Education  2017 [employer].

## 2016-10-11 ENCOUNTER — Ambulatory Visit: Payer: Medicaid Other | Admitting: Pharmacist

## 2016-10-17 ENCOUNTER — Other Ambulatory Visit: Payer: Self-pay | Admitting: Physician Assistant

## 2016-10-17 DIAGNOSIS — F39 Unspecified mood [affective] disorder: Secondary | ICD-10-CM

## 2016-10-31 ENCOUNTER — Other Ambulatory Visit: Payer: Self-pay | Admitting: Physician Assistant

## 2016-10-31 DIAGNOSIS — F411 Generalized anxiety disorder: Secondary | ICD-10-CM

## 2016-10-31 NOTE — Telephone Encounter (Signed)
Last seen 09/26/16  [name]  If approved route to nurse to call into [facility]

## 2016-11-02 NOTE — Telephone Encounter (Signed)
Rx called in 

## 2016-11-11 ENCOUNTER — Other Ambulatory Visit: Payer: Self-pay | Admitting: Physician Assistant

## 2016-11-15 ENCOUNTER — Ambulatory Visit: Payer: Medicaid Other | Admitting: Physician Assistant

## 2016-11-17 ENCOUNTER — Encounter: Payer: Self-pay | Admitting: Physician Assistant

## 2016-11-17 ENCOUNTER — Ambulatory Visit (INDEPENDENT_AMBULATORY_CARE_PROVIDER_SITE_OTHER): Payer: Medicaid Other | Admitting: Physician Assistant

## 2016-11-17 ENCOUNTER — Other Ambulatory Visit: Payer: Self-pay | Admitting: Physician Assistant

## 2016-11-17 DIAGNOSIS — F411 Generalized anxiety disorder: Secondary | ICD-10-CM | POA: Diagnosis not present

## 2016-11-17 DIAGNOSIS — F39 Unspecified mood [affective] disorder: Secondary | ICD-10-CM

## 2016-11-17 MED ORDER — ALPRAZOLAM 0.5 MG PO TABS: ORAL_TABLET | ORAL | 1 refills | Status: DC

## 2016-11-17 MED ORDER — DULOXETINE HCL 60 MG PO CPEP: 120.0000 mg | ORAL_CAPSULE | Freq: Every day | ORAL | 5 refills | Status: DC

## 2016-11-17 MED ORDER — MIRTAZAPINE 30 MG PO TABS: 30.0000 mg | ORAL_TABLET | Freq: Every day | ORAL | 2 refills | Status: DC

## 2016-11-17 NOTE — Progress Notes (Signed)
BP (!) 125/91   Pulse 99   Temp 98 F (36.7 C) (Oral)   Ht _0  (1.575 m)   Wt 153 lb 6.4 oz (69.6 kg)   BMI 28.06 kg/m    Subjective:    Patient ID: [name], female    DOB: [birth year], 61 y.o.   MRN: [id number]  HPI: ABBEE Yang is a 61 y.o. female presenting on 11/17/2016 for Hypertension (6 mos ckup) and Anxiety  This patient comes in for periodic recheck on medications and conditions including Mood disorder, insomnia and depression. She is having more depression at this time. She also states that the trazodone is giving her nightmares. She is sleeping some on it but is having very bad dreams. Reported to have her taper off of that. We will try Remeron at night to see if it helps with sleep. In the next month it is okay for her to take to alprazolam at bedtime if needed but hopefully in the next month should be able to titrate that back down. She is also doing well with Cymbalta with the pain and does think it can help more help and increased at 120 mg. A prior authorization will probably be needed for the depression. She has failed other serotonin medications including Lexapro, Cymbalta, Zoloft, Prozac.   All medications are reviewed today. There are no reports of any problems with the medications. All of the medical conditions are reviewed and updated.  Lab work is reviewed and will be ordered as medically necessary. There are no new problems reported with today's visit.   Relevant past medical, surgical, family and social history reviewed and updated as indicated. Allergies and medications reviewed and updated.  Past Medical History:  Diagnosis Date  . Anxiety   . Arthritis   . Back pain, chronic   . Bipolar 1 disorder ([place])   . Chest pain    "STRESS"; had normal nuclear stress test 02/2011; denied recent history 02/10/14  . Depression   . Difficulty sleeping    mind racing  . GERD (gastroesophageal reflux disease)   . Heart murmur    childhood  . Hiatal hernia     . History of stomach ulcers   . Hypercholesterolemia   . Hypertension   . Palpitations     Past Surgical History:  Procedure Laterality Date  . APPENDECTOMY    . BACK SURGERY    . CARPAL TUNNEL RELEASE     BILATERAL  . COLONOSCOPY, ESOPHAGOGASTRODUODENOSCOPY (EGD) AND ESOPHAGEAL DILATION N/A 02/06/2013   RMR. Benign colon polyp, diverticulosis, hemorrhoids. EGD with Schatzki's ring s/p 48 F dilation, 84m elliptical prepyloric antral ulcer with adjacent scarring, negative path  . ESOPHAGEAL MANOMETRY N/A 09/04/2015   Procedure: ESOPHAGEAL MANOMETRY (EM);  Surgeon: VWilford Corner MD;  Location: [facility];  Service: Endoscopy;  Laterality: N/A;  . ESOPHAGOGASTRODUODENOSCOPY N/A 09/23/2013   Dr. RGala Romney non-critical Schatzki's ring, not manipulated, healed ulcer, gastric bx negative  . ESOPHAGOGASTRODUODENOSCOPY (EGD) WITH PROPOFOL N/A 08/10/2015   Procedure: ESOPHAGOGASTRODUODENOSCOPY (EGD) WITH PROPOFOL;  Surgeon: RDaneil Dolin MD;  Location: [facility];  Service: Endoscopy;  Laterality: N/A;  0830  . INSERTION OF MESH N/A 10/02/2015   Procedure: INSERTION OF MESH;  Surgeon: ARalene Ok MD;  Location: [facility];  Service: General;  Laterality: N/A;  . LUMBAR FUSION  02/12/2014   LEVEL 1 L4 L5    DR BROOKS  . MALONEY DILATION N/A 08/10/2015   Procedure: MVenia MinksDILATION;  Surgeon: RCristopher Estimable  Rourk, MD;  Location: [facility];  Service: Endoscopy;  Laterality: N/A;  . PARTIAL KNEE ARTHROPLASTY Left 07/15/2013   Procedure: UNICOMPARTMENTAL LEFT KNEE MEDIAL ;  Surgeon: [name], MD;  Location: [facility];  Service: Orthopedics;  Laterality: Left;, Right uni knee done also.    Review of Systems  Constitutional: Negative.  Negative for activity change, fatigue and fever.  HENT: Negative.   Eyes: Negative.   Respiratory: Negative.  Negative for cough.   Cardiovascular: Negative.  Negative for chest pain.  Gastrointestinal: Negative.  Negative for abdominal pain.  Endocrine:  Negative.   Genitourinary: Negative.  Negative for dysuria.  Musculoskeletal: Negative.   Skin: Negative.   Neurological: Negative.     Allergies as of 11/17/2016      Reactions   Morphine And Related Nausea Only, Other (See Comments)   Dizziness       Medication List       Accurate as of 11/17/16 11:35 AM. Always use your most recent med list.          ALPRAZolam 0.5 MG tablet Commonly known as:  XANAX Take 1-2 Tablet by mouth 2 times a day as needed for anxiety   amphetamine-dextroamphetamine 20 MG tablet Commonly known as:  ADDERALL Take 1 Tablet by mouth every morning AND TAKE ONE TABLET BY MOUTH AT NOON   atorvastatin 20 MG tablet Commonly known as:  LIPITOR Take 1 tablet (20 mg total) by mouth daily.   DULoxetine 60 MG capsule Commonly known as:  CYMBALTA Take 2 capsules (120 mg total) by mouth daily.   linaclotide 290 MCG Caps capsule Commonly known as:  LINZESS Take 1 capsule (290 mcg total) by mouth daily.   lisinopril 20 MG tablet Commonly known as:  PRINIVIL,ZESTRIL Take 1 Tablet by mouth once daily   mirtazapine 30 MG tablet Commonly known as:  REMERON Take 1 tablet (30 mg total) by mouth at bedtime.   ondansetron 4 MG tablet Commonly known as:  ZOFRAN take 1  Tablet by mouth 3 times daily BEFORE meals   pantoprazole 40 MG tablet Commonly known as:  PROTONIX Take 1 tablet (40 mg total) by mouth daily. Take 30 minutes before breakfast daily.   QUEtiapine 400 MG tablet Commonly known as:  SEROQUEL Take 1 tablet by mouth at bedtime - Stop taking quetiapine 300 mg   TYLENOL ARTHRITIS PAIN 650 MG CR tablet Generic drug:  acetaminophen Take 650 mg by mouth every 8 (eight) hours as needed for pain.          Objective:    BP (!) 125/91   Pulse 99   Temp 98 F (36.7 C) (Oral)   Ht _0  (1.575 m)   Wt 153 lb 6.4 oz (69.6 kg)   BMI 28.06 kg/m   Allergies  Allergen Reactions  . Morphine And Related Nausea Only and Other (See Comments)     Dizziness     Physical Exam  Constitutional: She is oriented to person, place, and time. She appears well-developed and well-nourished.  HENT:  Head: Normocephalic and atraumatic.  Right Ear: Tympanic membrane, external ear and ear canal normal.  Left Ear: Tympanic membrane, external ear and ear canal normal.  Nose: Nose normal. No rhinorrhea.  Mouth/Throat: Oropharynx is clear and moist and mucous membranes are normal. No oropharyngeal exudate or posterior oropharyngeal erythema.  Eyes: Pupils are equal, round, and reactive to light. Conjunctivae and EOM are normal.  Neck: Normal range of motion. Neck supple.  Cardiovascular: Normal rate, regular rhythm, normal heart sounds and intact distal pulses.   Pulmonary/Chest: Effort normal and breath sounds normal.  Abdominal: Soft. Bowel sounds are normal.  Neurological: She is alert and oriented to person, place, and time. She has normal reflexes.  Skin: Skin is warm and dry. No rash noted.  Psychiatric: She has a normal mood and affect. Her behavior is normal. Judgment and thought content normal.    Results for orders placed or performed in visit on 09/26/16  CMP14+EGFR  Result Value Ref Range   Glucose 81 65 - 99 mg/dL   BUN 13 8 - 27 mg/dL   Creatinine, Ser 0.95 0.57 - 1.00 mg/dL   GFR calc non Af Amer 65 >59 mL/min/1.73   GFR calc Af Amer 75 >59 mL/min/1.73   BUN/Creatinine Ratio 14 12 - 28   Sodium 142 134 - 144 mmol/L   Potassium 4.1 3.5 - 5.2 mmol/L   Chloride 102 96 - 106 mmol/L   CO2 28 18 - 29 mmol/L   Calcium 9.7 8.7 - 10.3 mg/dL   Total Protein 6.9 6.0 - 8.5 g/dL   Albumin 4.2 3.6 - 4.8 g/dL   Globulin, Total 2.7 1.5 - 4.5 g/dL   Albumin/Globulin Ratio 1.6 1.2 - 2.2   Bilirubin Total 0.3 0.0 - 1.2 mg/dL   Alkaline Phosphatase 112 39 - 117 IU/L   AST 17 0 - 40 IU/L   ALT 10 0 - 32 IU/L  Lipid panel  Result Value Ref Range   Cholesterol, Total 233 (H) 100 - 199 mg/dL   Triglycerides 96 0 - 149 mg/dL   HDL 70 >39  mg/dL   VLDL Cholesterol Cal 19 5 - 40 mg/dL   LDL Calculated 144 (H) 0 - 99 mg/dL   Chol/HDL Ratio 3.3 0.0 - 4.4 ratio      Assessment & Plan:   1. Episodic mood disorder (HCC) - DULoxetine (CYMBALTA) 60 MG capsule; Take 2 capsules (120 mg total) by mouth daily.  Dispense: 60 capsule; Refill: 5 - mirtazapine (REMERON) 30 MG tablet; Take 1 tablet (30 mg total) by mouth at bedtime.  Dispense: 30 tablet; Refill: 2  2. GAD (generalized anxiety disorder) - ALPRAZolam (XANAX) 0.5 MG tablet; Take 1-2 Tablet by mouth 2 times a day as needed for anxiety  Dispense: 90 tablet; Refill: 1 - mirtazapine (REMERON) 30 MG tablet; Take 1 tablet (30 mg total) by mouth at bedtime.  Dispense: 30 tablet; Refill: 2   Current Outpatient Prescriptions:  .  acetaminophen (TYLENOL ARTHRITIS PAIN) 650 MG CR tablet, Take 650 mg by mouth every 8 (eight) hours as needed for pain., Disp: , Rfl:  .  ALPRAZolam (XANAX) 0.5 MG tablet, Take 1-2 Tablet by mouth 2 times a day as needed for anxiety, Disp: 90 tablet, Rfl: 1 .  amphetamine-dextroamphetamine (ADDERALL) 20 MG tablet, Take 1 Tablet by mouth every morning AND TAKE ONE TABLET BY MOUTH AT NOON, Disp: , Rfl: 0 .  atorvastatin (LIPITOR) 20 MG tablet, Take 1 tablet (20 mg total) by mouth daily., Disp: 90 tablet, Rfl: 3 .  DULoxetine (CYMBALTA) 60 MG capsule, Take 2 capsules (120 mg total) by mouth daily., Disp: 60 capsule, Rfl: 5 .  Linaclotide (LINZESS) 290 MCG CAPS capsule, Take 1 capsule (290 mcg total) by mouth daily. (Patient taking differently: Take 290 mcg by mouth daily as needed (For constipation.). ), Disp: 90 capsule, Rfl: 3 .  lisinopril (PRINIVIL,ZESTRIL) 20 MG tablet, Take 1 Tablet by mouth once  daily, Disp: 30 tablet, Rfl: 0 .  ondansetron (ZOFRAN) 4 MG tablet, take 1  Tablet by mouth 3 times daily BEFORE meals, Disp: 90 tablet, Rfl: 1 .  pantoprazole (PROTONIX) 40 MG tablet, Take 1 tablet (40 mg total) by mouth daily. Take 30 minutes before breakfast  daily., Disp: 90 tablet, Rfl: 3 .  QUEtiapine (SEROQUEL) 400 MG tablet, Take 1 tablet by mouth at bedtime - Stop taking quetiapine 300 mg, Disp: 30 tablet, Rfl: 1 .  mirtazapine (REMERON) 30 MG tablet, Take 1 tablet (30 mg total) by mouth at bedtime., Disp: 30 tablet, Rfl: 2   Continue all other maintenance medications as listed above.  Follow up plan: Return in about 4 weeks (around 12/15/2016) for recheck.  Educational handout given for [place] PA-C [facility] [address]  [place], [place] [address] [phone]   11/17/2016, 11:35 AM

## 2016-11-17 NOTE — Patient Instructions (Signed)
In a few days you may receive a survey in the mail or online from [employer] regarding your visit with us today. Please take a moment to fill this out. Your feedback is very important to our whole office. It can help us better understand your needs as well as improve your experience and satisfaction. Thank you for taking your time to complete it. We care about you.  Clairessa Boulet, PA-C  

## 2016-11-21 ENCOUNTER — Other Ambulatory Visit: Payer: Self-pay | Admitting: Family

## 2016-12-16 ENCOUNTER — Encounter: Payer: Self-pay | Admitting: Physician Assistant

## 2016-12-16 ENCOUNTER — Other Ambulatory Visit: Payer: Self-pay | Admitting: Physician Assistant

## 2016-12-16 ENCOUNTER — Ambulatory Visit (INDEPENDENT_AMBULATORY_CARE_PROVIDER_SITE_OTHER): Payer: Medicaid Other | Admitting: Physician Assistant

## 2016-12-16 VITALS — BP 99/77 | HR 119 | Temp 98.6°F | Ht 62.0 in | Wt 156.6 lb

## 2016-12-16 DIAGNOSIS — I10 Essential (primary) hypertension: Secondary | ICD-10-CM | POA: Diagnosis not present

## 2016-12-16 DIAGNOSIS — F339 Major depressive disorder, recurrent, unspecified: Secondary | ICD-10-CM | POA: Diagnosis not present

## 2016-12-16 DIAGNOSIS — F411 Generalized anxiety disorder: Secondary | ICD-10-CM

## 2016-12-16 MED ORDER — BUPROPION HCL ER (SR) 150 MG PO TB12: 150.0000 mg | ORAL_TABLET | Freq: Every day | ORAL | 2 refills | Status: DC

## 2016-12-16 NOTE — Patient Instructions (Signed)
In a few days you may receive a survey in the mail or online from [employer] regarding your visit with us today. Please take a moment to fill this out. Your feedback is very important to our whole office. It can help us better understand your needs as well as improve your experience and satisfaction. Thank you for taking your time to complete it. We care about you.  Yoanna Jurczyk, PA-C  

## 2016-12-16 NOTE — Progress Notes (Signed)
BP 99/77   Pulse (!) 119   Temp 98.6 F (37 C) (Oral)   Ht '5\' 2"'$  (1.575 m)   Wt 156 lb 9.6 oz (71 kg)   BMI 28.64 kg/m    Subjective:    Patient ID: [name], female    DOB: 1955/10/30, 61 y.o.   MRN: [id number]  HPI: [name] is a 62 y.o. female presenting on 12/16/2016 for Follow-up (4 week rck on Medication )  This patient comes in for periodic recheck on medications and conditions including depression and anxiety. She reports still sleep problems and depression being bad. No SI or plans of hurting herself.   Depression screen [facility] 2/9 12/16/2016 11/17/2016 09/26/2016 05/17/2016 04/15/2016  Decreased Interest '1 1 1 3 3  '$ Down, Depressed, Hopeless 0 '1 1 3 3  '$ PHQ - 2 Score '1 2 2 6 6  '$ Altered sleeping - 1 1 0 3  Tired, decreased energy - '1 1 3 3  '$ Change in appetite - 0 '1 2 3  '$ Feeling bad or failure about yourself  - 0 0 0 3  Trouble concentrating - 0 '1 2 3  '$ Moving slowly or fidgety/restless - 0 0 0 2  Suicidal thoughts - 0 0 0 0  PHQ-9 Score - '4 6 13 23  '$ Difficult doing work/chores - Not difficult at all - Somewhat difficult Somewhat difficult   .   All medications are reviewed today. There are no reports of any problems with the medications. All of the medical conditions are reviewed and updated.  Lab work is reviewed and will be ordered as medically necessary. There are no new problems reported with today's visit.   Relevant past medical, surgical, family and social history reviewed and updated as indicated. Allergies and medications reviewed and updated.  Past Medical History:  Diagnosis Date  . Anxiety   . Arthritis   . Back pain, chronic   . Bipolar 1 disorder ([place])   . Chest pain    "STRESS"; had normal nuclear stress test 02/2011; denied recent history 02/10/14  . Depression   . Difficulty sleeping    mind racing  . GERD (gastroesophageal reflux disease)   . Heart murmur    childhood  . Hiatal hernia   . History of stomach ulcers   .  Hypercholesterolemia   . Hypertension   . Palpitations     Past Surgical History:  Procedure Laterality Date  . APPENDECTOMY    . BACK SURGERY    . CARPAL TUNNEL RELEASE     BILATERAL  . COLONOSCOPY, ESOPHAGOGASTRODUODENOSCOPY (EGD) AND ESOPHAGEAL DILATION N/A 02/06/2013   RMR. Benign colon polyp, diverticulosis, hemorrhoids. EGD with Schatzki's ring s/p 30 F dilation, 1m elliptical prepyloric antral ulcer with adjacent scarring, negative path  . ESOPHAGEAL MANOMETRY N/A 09/04/2015   Procedure: ESOPHAGEAL MANOMETRY (EM);  Surgeon: VWilford Corner MD;  [facility];  Service: Endoscopy;  Laterality: N/A;  . ESOPHAGOGASTRODUODENOSCOPY N/A 09/23/2013   Dr. RGala Romney non-critical Schatzki's ring, not manipulated, healed ulcer, gastric bx negative  . ESOPHAGOGASTRODUODENOSCOPY (EGD) WITH PROPOFOL N/A 08/10/2015   Procedure: ESOPHAGOGASTRODUODENOSCOPY (EGD) WITH PROPOFOL;  Surgeon: RDaneil Dolin MD;  [facility];  Service: Endoscopy;  Laterality: N/A;  0830  . INSERTION OF MESH N/A 10/02/2015   Procedure: INSERTION OF MESH;  Surgeon: ARalene Ok MD;  Location: [facility];  Service: General;  Laterality: N/A;  . LUMBAR FUSION  02/12/2014   LEVEL 1 L4 L5    DR  BROOKS  . MALONEY DILATION N/A 08/10/2015   Procedure: Venia Minks DILATION;  Surgeon: Daneil Dolin, MD;  [facility];  Service: Endoscopy;  Laterality: N/A;  . PARTIAL KNEE ARTHROPLASTY Left 07/15/2013   Procedure: UNICOMPARTMENTAL LEFT KNEE MEDIAL ;  Surgeon: [name], MD;  Location: [facility];  Service: Orthopedics;  Laterality: Left;, Right uni knee done also.    Review of Systems  Constitutional: Negative.  Negative for activity change, fatigue and fever.  HENT: Negative.   Eyes: Negative.   Respiratory: Negative.  Negative for cough.   Cardiovascular: Negative.  Negative for chest pain.  Gastrointestinal: Negative.  Negative for abdominal pain.  Endocrine: Negative.   Genitourinary: Negative.   Negative for dysuria.  Musculoskeletal: Negative.   Skin: Negative.   Neurological: Negative.   Psychiatric/Behavioral: Positive for dysphoric mood and sleep disturbance. Negative for self-injury and suicidal ideas.    Allergies as of 12/16/2016      Reactions   Morphine And Related Nausea Only, Other (See Comments)   Dizziness       Medication List       Accurate as of 12/16/16 11:59 PM. Always use your most recent med list.          ALPRAZolam 0.5 MG tablet Commonly known as:  XANAX Take 1-2 Tablet by mouth 2 times a day as needed for anxiety   amphetamine-dextroamphetamine 20 MG tablet Commonly known as:  ADDERALL Take 1 Tablet by mouth every morning AND TAKE ONE TABLET BY MOUTH AT NOON   atorvastatin 20 MG tablet Commonly known as:  LIPITOR Take 1 Tablet by mouth once daily   buPROPion 150 MG 12 hr tablet Commonly known as:  WELLBUTRIN SR Take 1 tablet (150 mg total) by mouth daily.   DULoxetine 60 MG capsule Commonly known as:  CYMBALTA Take 2 capsules (120 mg total) by mouth daily.   linaclotide 290 MCG Caps capsule Commonly known as:  LINZESS Take 1 capsule (290 mcg total) by mouth daily.   mirtazapine 30 MG tablet Commonly known as:  REMERON Take 1 tablet (30 mg total) by mouth at bedtime.   ondansetron 4 MG tablet Commonly known as:  ZOFRAN take 1  Tablet by mouth 3 times daily BEFORE meals   pantoprazole 40 MG tablet Commonly known as:  PROTONIX Take 1 tablet (40 mg total) by mouth daily. Take 30 minutes before breakfast daily.   QUEtiapine 400 MG tablet Commonly known as:  SEROQUEL Take 1 tablet by mouth at bedtime - Stop taking quetiapine 300 mg   TYLENOL ARTHRITIS PAIN 650 MG CR tablet Generic drug:  acetaminophen Take 650 mg by mouth every 8 (eight) hours as needed for pain.          Objective:    BP 99/77   Pulse (!) 119   Temp 98.6 F (37 C) (Oral)   Ht '5\' 2"'$  (1.575 m)   Wt 156 lb 9.6 oz (71 kg)   BMI 28.64 kg/m   Allergies    Allergen Reactions  . Morphine And Related Nausea Only and Other (See Comments)    Dizziness     Physical Exam  Constitutional: She is oriented to person, place, and time. She appears well-developed and well-nourished.  HENT:  Head: Normocephalic and atraumatic.  Eyes: Pupils are equal, round, and reactive to light. Conjunctivae and EOM are normal.  Cardiovascular: Normal rate, regular rhythm, normal heart sounds and intact distal pulses.   Pulmonary/Chest: Effort normal and breath sounds  normal.  Abdominal: Soft. Bowel sounds are normal.  Neurological: She is alert and oriented to person, place, and time. She has normal reflexes.  Skin: Skin is warm and dry. No rash noted.  Psychiatric: She has a normal mood and affect. Her behavior is normal. Judgment and thought content normal.    Results for orders placed or performed in visit on 09/26/16  CMP14+EGFR  Result Value Ref Range   Glucose 81 65 - 99 mg/dL   BUN 13 8 - 27 mg/dL   Creatinine, Ser 0.95 0.57 - 1.00 mg/dL   GFR calc non Af Amer 65 >59 mL/min/1.73   GFR calc Af Amer 75 >59 mL/min/1.73   BUN/Creatinine Ratio 14 12 - 28   Sodium 142 134 - 144 mmol/L   Potassium 4.1 3.5 - 5.2 mmol/L   Chloride 102 96 - 106 mmol/L   CO2 28 18 - 29 mmol/L   Calcium 9.7 8.7 - 10.3 mg/dL   Total Protein 6.9 6.0 - 8.5 g/dL   Albumin 4.2 3.6 - 4.8 g/dL   Globulin, Total 2.7 1.5 - 4.5 g/dL   Albumin/Globulin Ratio 1.6 1.2 - 2.2   Bilirubin Total 0.3 0.0 - 1.2 mg/dL   Alkaline Phosphatase 112 39 - 117 IU/L   AST 17 0 - 40 IU/L   ALT 10 0 - 32 IU/L  Lipid panel  Result Value Ref Range   Cholesterol, Total 233 (H) 100 - 199 mg/dL   Triglycerides 96 0 - 149 mg/dL   HDL 70 >39 mg/dL   VLDL Cholesterol Cal 19 5 - 40 mg/dL   LDL Calculated 144 (H) 0 - 99 mg/dL   Chol/HDL Ratio 3.3 0.0 - 4.4 ratio      Assessment & Plan:   1. Depression, recurrent (HCC) - buPROPion (WELLBUTRIN SR) 150 MG 12 hr tablet; Take 1 tablet (150 mg total) by  mouth daily.  Dispense: 30 tablet; Refill: 2  2. Essential hypertension    Current Outpatient Prescriptions:  .  acetaminophen (TYLENOL ARTHRITIS PAIN) 650 MG CR tablet, Take 650 mg by mouth every 8 (eight) hours as needed for pain., Disp: , Rfl:  .  ALPRAZolam (XANAX) 0.5 MG tablet, Take 1-2 Tablet by mouth 2 times a day as needed for anxiety, Disp: 90 tablet, Rfl: 1 .  amphetamine-dextroamphetamine (ADDERALL) 20 MG tablet, Take 1 Tablet by mouth every morning AND TAKE ONE TABLET BY MOUTH AT NOON, Disp: , Rfl: 0 .  atorvastatin (LIPITOR) 20 MG tablet, Take 1 Tablet by mouth once daily, Disp: 90 tablet, Rfl: 0 .  DULoxetine (CYMBALTA) 60 MG capsule, Take 2 capsules (120 mg total) by mouth daily., Disp: 60 capsule, Rfl: 5 .  Linaclotide (LINZESS) 290 MCG CAPS capsule, Take 1 capsule (290 mcg total) by mouth daily. (Patient taking differently: Take 290 mcg by mouth daily as needed (For constipation.). ), Disp: 90 capsule, Rfl: 3 .  mirtazapine (REMERON) 30 MG tablet, Take 1 tablet (30 mg total) by mouth at bedtime., Disp: 30 tablet, Rfl: 2 .  ondansetron (ZOFRAN) 4 MG tablet, take 1  Tablet by mouth 3 times daily BEFORE meals, Disp: 90 tablet, Rfl: 1 .  pantoprazole (PROTONIX) 40 MG tablet, Take 1 tablet (40 mg total) by mouth daily. Take 30 minutes before breakfast daily., Disp: 90 tablet, Rfl: 3 .  QUEtiapine (SEROQUEL) 400 MG tablet, Take 1 tablet by mouth at bedtime - Stop taking quetiapine 300 mg, Disp: 30 tablet, Rfl: 5 .  buPROPion [facility] SR) 150  MG 12 hr tablet, Take 1 tablet (150 mg total) by mouth daily., Disp: 30 tablet, Rfl: 2 Continue all other maintenance medications as listed above.  Follow up plan: Return in about 4 weeks (around 01/13/2017) for recheck.  Educational handout given for [place] PA-C [place] [address]  [place], [place] [address] [phone]   12/18/2016, 9:33 PM

## 2016-12-18 DIAGNOSIS — F339 Major depressive disorder, recurrent, unspecified: Secondary | ICD-10-CM | POA: Insufficient documentation

## 2016-12-19 NOTE — Telephone Encounter (Signed)
Called in.

## 2017-01-13 ENCOUNTER — Encounter: Payer: Self-pay | Admitting: Physician Assistant

## 2017-01-13 ENCOUNTER — Other Ambulatory Visit: Payer: Self-pay

## 2017-01-13 ENCOUNTER — Ambulatory Visit (INDEPENDENT_AMBULATORY_CARE_PROVIDER_SITE_OTHER): Payer: Medicaid Other | Admitting: Physician Assistant

## 2017-01-13 ENCOUNTER — Other Ambulatory Visit: Payer: Self-pay | Admitting: Physician Assistant

## 2017-01-13 DIAGNOSIS — F411 Generalized anxiety disorder: Secondary | ICD-10-CM

## 2017-01-13 DIAGNOSIS — F39 Unspecified mood [affective] disorder: Secondary | ICD-10-CM | POA: Diagnosis not present

## 2017-01-13 DIAGNOSIS — F339 Major depressive disorder, recurrent, unspecified: Secondary | ICD-10-CM | POA: Diagnosis not present

## 2017-01-13 MED ORDER — DULOXETINE HCL 60 MG PO CPEP: 60.0000 mg | ORAL_CAPSULE | Freq: Every day | ORAL | 5 refills | Status: DC

## 2017-01-13 MED ORDER — BUPROPION HCL ER (XL) 300 MG PO TB24: 300.0000 mg | ORAL_TABLET | Freq: Every day | ORAL | 2 refills | Status: DC

## 2017-01-13 MED ORDER — ALPRAZOLAM 0.5 MG PO TABS: ORAL_TABLET | ORAL | 2 refills | Status: DC

## 2017-01-13 MED ORDER — MIRTAZAPINE 30 MG PO TABS
30.0000 mg | ORAL_TABLET | Freq: Every day | ORAL | 5 refills | Status: DC
Start: 2017-01-13 — End: 2017-02-13

## 2017-01-13 NOTE — Patient Instructions (Signed)
In a few days you may receive a survey in the mail or online from [employer] regarding your visit with us today. Please take a moment to fill this out. Your feedback is very important to our whole office. It can help us better understand your needs as well as improve your experience and satisfaction. Thank you for taking your time to complete it. We care about you.  Fortunata Betty, PA-C  

## 2017-01-13 NOTE — Progress Notes (Signed)
BP (!) 134/98   Pulse 99   Temp 98.6 F (37 C) (Oral)   Ht '5\' 2"'$  (1.575 m)   Wt 160 lb 6.4 oz (72.8 kg)   BMI 29.34 kg/m    Subjective:    Patient ID: [name], female    DOB: November 09, 1955, 61 y.o.   MRN: [id number]  HPI: [name] is a 61 y.o. female presenting on 01/13/2017 for depression recheck  This patient comes in for periodic recheck on medications and conditions including depression an anxiety. Reports feeling some better and toleraing meds well. Depression screen [facility] 2/9 01/13/2017 12/16/2016 11/17/2016 09/26/2016 05/17/2016  Decreased Interest - '1 1 1 3  '$ Down, Depressed, Hopeless - 0 '1 1 3  '$ PHQ - 2 Score - '1 2 2 6  '$ Altered sleeping 0 - 1 1 0  Tired, decreased energy 1 - '1 1 3  '$ Change in appetite 1 - 0 1 2  Feeling bad or failure about yourself  0 - 0 0 0  Trouble concentrating 0 - 0 1 2  Moving slowly or fidgety/restless 0 - 0 0 0  Suicidal thoughts 0 - 0 0 0  PHQ-9 Score - - '4 6 13  '$ Difficult doing work/chores - - Not difficult at all - Somewhat difficult   .   All medications are reviewed today. There are no reports of any problems with the medications. All of the medical conditions are reviewed and updated.  Lab work is reviewed and will be ordered as medically necessary. There are no new problems reported with today's visit.   Relevant past medical, surgical, family and social history reviewed and updated as indicated. Allergies and medications reviewed and updated.  Past Medical History:  Diagnosis Date  . Anxiety   . Arthritis   . Back pain, chronic   . Bipolar 1 disorder ([place])   . Chest pain    "STRESS"; had normal nuclear stress test 02/2011; denied recent history 02/10/14  . Depression   . Difficulty sleeping    mind racing  . GERD (gastroesophageal reflux disease)   . Heart murmur    childhood  . Hiatal hernia   . History of stomach ulcers   . Hypercholesterolemia   . Hypertension   . Palpitations     Past Surgical History:    Procedure Laterality Date  . APPENDECTOMY    . BACK SURGERY    . CARPAL TUNNEL RELEASE     BILATERAL  . COLONOSCOPY, ESOPHAGOGASTRODUODENOSCOPY (EGD) AND ESOPHAGEAL DILATION N/A 02/06/2013   RMR. Benign colon polyp, diverticulosis, hemorrhoids. EGD with Schatzki's ring s/p 60 F dilation, 30m elliptical prepyloric antral ulcer with adjacent scarring, negative path  . ESOPHAGEAL MANOMETRY N/A 09/04/2015   Procedure: ESOPHAGEAL MANOMETRY (EM);  Surgeon: VWilford Corner MD;  [facility];  Service: Endoscopy;  Laterality: N/A;  . ESOPHAGOGASTRODUODENOSCOPY N/A 09/23/2013   Dr. RGala Romney non-critical Schatzki's ring, not manipulated, healed ulcer, gastric bx negative  . ESOPHAGOGASTRODUODENOSCOPY (EGD) WITH PROPOFOL N/A 08/10/2015   Procedure: ESOPHAGOGASTRODUODENOSCOPY (EGD) WITH PROPOFOL;  Surgeon: RDaneil Dolin MD;  [facility];  Service: Endoscopy;  Laterality: N/A;  0830  . INSERTION OF MESH N/A 10/02/2015   Procedure: INSERTION OF MESH;  Surgeon: ARalene Ok MD;  [facility];  Service: General;  Laterality: N/A;  . LUMBAR FUSION  02/12/2014   LEVEL 1 L4 L5    DR BROOKS  . MALONEY DILATION N/A 08/10/2015   Procedure: MALONEY DILATION;  Surgeon:  Daneil Dolin, MD;  [facility];  Service: Endoscopy;  Laterality: N/A;  . PARTIAL KNEE ARTHROPLASTY Left 07/15/2013   Procedure: UNICOMPARTMENTAL LEFT KNEE MEDIAL ;  Surgeon: [name], MD;  [facility];  Service: Orthopedics;  Laterality: Left;, Right uni knee done also.    Review of Systems  Constitutional: Negative.  Negative for activity change, fatigue and fever.  HENT: Negative.   Eyes: Negative.   Respiratory: Negative.  Negative for cough.   Cardiovascular: Negative.  Negative for chest pain.  Gastrointestinal: Negative.  Negative for abdominal pain.  Endocrine: Negative.   Genitourinary: Negative.  Negative for dysuria.  Musculoskeletal: Negative.   Skin: Negative.   Neurological:  Negative.     Allergies as of 01/13/2017      Reactions   Morphine And Related Nausea Only, Other (See Comments)   Dizziness       Medication List       Accurate as of 01/13/17 11:59 PM. Always use your most recent med list.          ALPRAZolam 0.5 MG tablet Commonly known as:  XANAX TAKE ONE OR TWO TABLETS BY MOUTH TWICE DAILY AS NEEDED FOR ANXIETY   amphetamine-dextroamphetamine 20 MG tablet Commonly known as:  ADDERALL Take 1 Tablet by mouth every morning AND TAKE ONE TABLET BY MOUTH AT NOON   atorvastatin 20 MG tablet Commonly known as:  LIPITOR Take 1 Tablet by mouth once daily   buPROPion 300 MG 24 hr tablet Commonly known as:  WELLBUTRIN XL Take 1 tablet (300 mg total) by mouth daily.   DULoxetine 60 MG capsule Commonly known as:  CYMBALTA Take 1-2 capsules (60-120 mg total) by mouth daily.   linaclotide 290 MCG Caps capsule Commonly known as:  LINZESS Take 1 capsule (290 mcg total) by mouth daily.   mirtazapine 30 MG tablet Commonly known as:  REMERON Take 1 tablet (30 mg total) by mouth at bedtime.   ondansetron 4 MG tablet Commonly known as:  ZOFRAN take 1  Tablet by mouth 3 times daily BEFORE meals   pantoprazole 40 MG tablet Commonly known as:  PROTONIX Take 1 tablet (40 mg total) by mouth daily. Take 30 minutes before breakfast daily.   QUEtiapine 400 MG tablet Commonly known as:  SEROQUEL Take 1 tablet by mouth at bedtime - Stop taking quetiapine 300 mg   TYLENOL ARTHRITIS PAIN 650 MG CR tablet Generic drug:  acetaminophen Take 650 mg by mouth every 8 (eight) hours as needed for pain.            Discharge Care Instructions        Start     Ordered   01/13/17 0000  DULoxetine (CYMBALTA) 60 MG capsule  Daily    Question:  Supervising Provider  Answer:  Timmothy Euler   01/13/17 1458   01/13/17 0000  buPROPion (WELLBUTRIN XL) 300 MG 24 hr tablet  Daily    Question:  Supervising Provider  Answer:  Timmothy Euler   01/13/17 1458    01/13/17 0000  ALPRAZolam (XANAX) 0.5 MG tablet    Question:  Supervising Provider  Answer:  Timmothy Euler   01/13/17 1459         Objective:    BP (!) 134/98   Pulse 99   Temp 98.6 F (37 C) (Oral)   Ht '5\' 2"'$  (1.575 m)   Wt 160 lb 6.4 oz (72.8 kg)   BMI 29.34 kg/m  Allergies  Allergen Reactions  . Morphine And Related Nausea Only and Other (See Comments)    Dizziness     Physical Exam  Constitutional: She is oriented to person, place, and time. She appears well-developed and well-nourished.  HENT:  Head: Normocephalic and atraumatic.  Eyes: Pupils are equal, round, and reactive to light. Conjunctivae and EOM are normal.  Cardiovascular: Normal rate, regular rhythm, normal heart sounds and intact distal pulses.   Pulmonary/Chest: Effort normal and breath sounds normal.  Abdominal: Soft. Bowel sounds are normal.  Neurological: She is alert and oriented to person, place, and time. She has normal reflexes.  Skin: Skin is warm and dry. No rash noted.  Psychiatric: She has a normal mood and affect. Her behavior is normal. Judgment and thought content normal.    Results for orders placed or performed in visit on 09/26/16  CMP14+EGFR  Result Value Ref Range   Glucose 81 65 - 99 mg/dL   BUN 13 8 - 27 mg/dL   Creatinine, Ser 0.95 0.57 - 1.00 mg/dL   GFR calc non Af Amer 65 >59 mL/min/1.73   GFR calc Af Amer 75 >59 mL/min/1.73   BUN/Creatinine Ratio 14 12 - 28   Sodium 142 134 - 144 mmol/L   Potassium 4.1 3.5 - 5.2 mmol/L   Chloride 102 96 - 106 mmol/L   CO2 28 18 - 29 mmol/L   Calcium 9.7 8.7 - 10.3 mg/dL   Total Protein 6.9 6.0 - 8.5 g/dL   Albumin 4.2 3.6 - 4.8 g/dL   Globulin, Total 2.7 1.5 - 4.5 g/dL   Albumin/Globulin Ratio 1.6 1.2 - 2.2   Bilirubin Total 0.3 0.0 - 1.2 mg/dL   Alkaline Phosphatase 112 39 - 117 IU/L   AST 17 0 - 40 IU/L   ALT 10 0 - 32 IU/L  Lipid panel  Result Value Ref Range   Cholesterol, Total 233 (H) 100 - 199 mg/dL    Triglycerides 96 0 - 149 mg/dL   HDL 70 >39 mg/dL   VLDL Cholesterol Cal 19 5 - 40 mg/dL   LDL Calculated 144 (H) 0 - 99 mg/dL   Chol/HDL Ratio 3.3 0.0 - 4.4 ratio      Assessment & Plan:   1. Episodic mood disorder (HCC) - DULoxetine (CYMBALTA) 60 MG capsule; Take 1-2 capsules (60-120 mg total) by mouth daily.  Dispense: 60 capsule; Refill: 5  2. Depression, recurrent ([place])  3. GAD (generalized anxiety disorder) - ALPRAZolam (XANAX) 0.5 MG tablet; TAKE ONE OR TWO TABLETS BY MOUTH TWICE DAILY AS NEEDED FOR ANXIETY  Dispense: 90 tablet; Refill: 2    Current Outpatient Prescriptions:  .  acetaminophen (TYLENOL ARTHRITIS PAIN) 650 MG CR tablet, Take 650 mg by mouth every 8 (eight) hours as needed for pain., Disp: , Rfl:  .  ALPRAZolam (XANAX) 0.5 MG tablet, TAKE ONE OR TWO TABLETS BY MOUTH TWICE DAILY AS NEEDED FOR ANXIETY, Disp: 90 tablet, Rfl: 2 .  amphetamine-dextroamphetamine (ADDERALL) 20 MG tablet, Take 1 Tablet by mouth every morning AND TAKE ONE TABLET BY MOUTH AT NOON, Disp: , Rfl: 0 .  atorvastatin (LIPITOR) 20 MG tablet, Take 1 Tablet by mouth once daily, Disp: 90 tablet, Rfl: 0 .  DULoxetine (CYMBALTA) 60 MG capsule, Take 1-2 capsules (60-120 mg total) by mouth daily., Disp: 60 capsule, Rfl: 5 .  Linaclotide (LINZESS) 290 MCG CAPS capsule, Take 1 capsule (290 mcg total) by mouth daily. (Patient taking differently: Take 290 mcg by mouth daily  as needed (For constipation.). ), Disp: 90 capsule, Rfl: 3 .  pantoprazole (PROTONIX) 40 MG tablet, Take 1 tablet (40 mg total) by mouth daily. Take 30 minutes before breakfast daily., Disp: 90 tablet, Rfl: 3 .  QUEtiapine (SEROQUEL) 400 MG tablet, Take 1 tablet by mouth at bedtime - Stop taking quetiapine 300 mg, Disp: 30 tablet, Rfl: 5 .  buPROPion (WELLBUTRIN XL) 300 MG 24 hr tablet, Take 1 tablet (300 mg total) by mouth daily., Disp: 30 tablet, Rfl: 2 .  mirtazapine (REMERON) 30 MG tablet, Take 1 tablet (30 mg total) by mouth at  bedtime., Disp: 30 tablet, Rfl: 5 .  ondansetron (ZOFRAN) 4 MG tablet, take 1  Tablet by mouth 3 times daily BEFORE meals (Patient not taking: Reported on 01/13/2017), Disp: 90 tablet, Rfl: 1 Continue all other maintenance medications as listed above.  Follow up plan: Return in about 4 weeks (around 02/10/2017) for recheck.  Educational handout given for [place] PA-C [place] [address]  [place], [place] [address] [phone]   01/16/2017, 8:51 PM

## 2017-01-24 ENCOUNTER — Other Ambulatory Visit: Payer: Self-pay | Admitting: Physician Assistant

## 2017-02-13 ENCOUNTER — Ambulatory Visit: Payer: Medicaid Other | Admitting: Physician Assistant

## 2017-02-13 ENCOUNTER — Encounter: Payer: Self-pay | Admitting: Physician Assistant

## 2017-02-13 VITALS — BP 103/80 | HR 91 | Temp 99.1°F | Ht 62.0 in | Wt 164.4 lb

## 2017-02-13 DIAGNOSIS — F39 Unspecified mood [affective] disorder: Secondary | ICD-10-CM | POA: Diagnosis not present

## 2017-02-13 DIAGNOSIS — F5101 Primary insomnia: Secondary | ICD-10-CM | POA: Diagnosis not present

## 2017-02-13 DIAGNOSIS — F339 Major depressive disorder, recurrent, unspecified: Secondary | ICD-10-CM

## 2017-02-13 MED ORDER — QUETIAPINE FUMARATE 400 MG PO TABS: 800.0000 mg | ORAL_TABLET | Freq: Every day | ORAL | 1 refills | Status: DC

## 2017-02-13 MED ORDER — ZOLPIDEM TARTRATE 5 MG PO TABS: 5.0000 mg | ORAL_TABLET | Freq: Every evening | ORAL | 2 refills | Status: DC | PRN

## 2017-02-13 NOTE — Progress Notes (Signed)
BP 103/80   Pulse 91   Temp 99.1 F (37.3 C) (Oral)   Ht 5\' 2"  (1.575 m)   Wt 164 lb 6.4 oz (74.6 kg)   BMI 30.07 kg/m    Subjective:    Patient ID: [name], female    DOB: 1955-11-20, 61 y.o.   MRN: [id number]  HPI: [name] is a 61 y.o. female presenting on 02/13/2017 for Follow-up (1 month )  The patient reports that her depression is improved. However she is still having a severe time with sleep. Remeron is not making her sleepy at all. We had hoped that this would help but it has not improved her sleep whatsoever. She says over the past 3 days she has slept about 6 hours total. She has never taken Ambien in the past before she has tried multiple other medications without improvement.  Depression screen [facility] 2/9 02/13/2017 01/13/2017 12/16/2016 11/17/2016 09/26/2016  Decreased Interest 2 - 1 1 1   Down, Depressed, Hopeless 1 - 0 1 1  PHQ - 2 Score 3 - 1 2 2   Altered sleeping 1 0 - 1 1  Tired, decreased energy 1 1 - 1 1  Change in appetite 1 1 - 0 1  Feeling bad or failure about yourself  0 0 - 0 0  Trouble concentrating 0 0 - 0 1  Moving slowly or fidgety/restless 0 0 - 0 0  Suicidal thoughts 0 0 - 0 0  PHQ-9 Score 6 - - 4 6  Difficult doing work/chores - - - Not difficult at all -    Relevant past medical, surgical, family and social history reviewed and updated as indicated. Allergies and medications reviewed and updated.  Past Medical History:  Diagnosis Date  . Anxiety   . Arthritis   . Back pain, chronic   . Bipolar 1 disorder ([place])   . Chest pain    "STRESS"; had normal nuclear stress test 02/2011; denied recent history 02/10/14  . Depression   . Difficulty sleeping    mind racing  . GERD (gastroesophageal reflux disease)   . Heart murmur    childhood  . Hiatal hernia   . History of stomach ulcers   . Hypercholesterolemia   . Hypertension   . Palpitations     Past Surgical History:  Procedure Laterality Date  . APPENDECTOMY    . BACK SURGERY     . CARPAL TUNNEL RELEASE     BILATERAL  . COLONOSCOPY, ESOPHAGOGASTRODUODENOSCOPY (EGD) AND ESOPHAGEAL DILATION N/A 02/06/2013   RMR. Benign colon polyp, diverticulosis, hemorrhoids. EGD with Schatzki's ring s/p 78 F dilation, 55mm elliptical prepyloric antral ulcer with adjacent scarring, negative path  . ESOPHAGEAL MANOMETRY N/A 09/04/2015   Procedure: ESOPHAGEAL MANOMETRY (EM);  Surgeon: Wilford Corner, MD;  Location: [facility];  Service: Endoscopy;  Laterality: N/A;  . ESOPHAGOGASTRODUODENOSCOPY N/A 09/23/2013   Dr. Gala Romney: non-critical Schatzki's ring, not manipulated, healed ulcer, gastric bx negative  . ESOPHAGOGASTRODUODENOSCOPY (EGD) WITH PROPOFOL N/A 08/10/2015   Procedure: ESOPHAGOGASTRODUODENOSCOPY (EGD) WITH PROPOFOL;  Surgeon: Daneil Dolin, MD;  [facility];  Service: Endoscopy;  Laterality: N/A;  0830  . INSERTION OF MESH N/A 10/02/2015   Procedure: INSERTION OF MESH;  Surgeon: [name], MD;  Location: [facility];  Service: General;  Laterality: N/A;  . LUMBAR FUSION  02/12/2014   LEVEL 1 L4 L5    DR BROOKS  . MALONEY DILATION N/A 08/10/2015   Procedure: Venia Minks DILATION;  Surgeon: Herbie Baltimore  Hilton Cork, MD;  [facility];  Service: Endoscopy;  Laterality: N/A;  . PARTIAL KNEE ARTHROPLASTY Left 07/15/2013   Procedure: UNICOMPARTMENTAL LEFT KNEE MEDIAL ;  Surgeon: [name], MD;  Location: [facility];  Service: Orthopedics;  Laterality: Left;, Right uni knee done also.    Review of Systems  Constitutional: Negative.   HENT: Negative.   Eyes: Negative.   Respiratory: Negative.   Gastrointestinal: Negative.   Genitourinary: Negative.   Psychiatric/Behavioral: Positive for decreased concentration, dysphoric mood and sleep disturbance.    Allergies as of 02/13/2017      Reactions   Morphine And Related Nausea Only, Other (See Comments)   Dizziness       Medication List       Accurate as of 02/13/17 11:59 PM. Always use your most recent med list.           ALPRAZolam 0.5 MG tablet Commonly known as:  XANAX TAKE ONE OR TWO TABLETS BY MOUTH TWICE DAILY AS NEEDED FOR ANXIETY   amphetamine-dextroamphetamine 20 MG tablet Commonly known as:  ADDERALL Take 1 Tablet by mouth every morning AND TAKE ONE TABLET BY MOUTH AT NOON   atorvastatin 20 MG tablet Commonly known as:  LIPITOR Take 1 Tablet by mouth once daily   buPROPion 300 MG 24 hr tablet Commonly known as:  WELLBUTRIN XL Take 1 tablet (300 mg total) by mouth daily.   DULoxetine 60 MG capsule Commonly known as:  CYMBALTA Take 1-2 capsules (60-120 mg total) by mouth daily.   linaclotide 290 MCG Caps capsule Commonly known as:  LINZESS Take 1 capsule (290 mcg total) by mouth daily.   lisinopril 20 MG tablet Commonly known as:  PRINIVIL,ZESTRIL Take 1 Tablet by mouth once daily   ondansetron 4 MG tablet Commonly known as:  ZOFRAN take 1  Tablet by mouth 3 times daily BEFORE meals   pantoprazole 40 MG tablet Commonly known as:  PROTONIX Take 1 tablet (40 mg total) by mouth daily. Take 30 minutes before breakfast daily.   QUEtiapine 400 MG tablet Commonly known as:  SEROQUEL Take 2 tablets (800 mg total) by mouth at bedtime.   TYLENOL ARTHRITIS PAIN 650 MG CR tablet Generic drug:  acetaminophen Take 650 mg by mouth every 8 (eight) hours as needed for pain.   zolpidem 5 MG tablet Commonly known as:  AMBIEN Take 1 tablet (5 mg total) by mouth at bedtime as needed for sleep.          Objective:    BP 103/80   Pulse 91   Temp 99.1 F (37.3 C) (Oral)   Ht 5\' 2"  (1.575 m)   Wt 164 lb 6.4 oz (74.6 kg)   BMI 30.07 kg/m   Allergies  Allergen Reactions  . Morphine And Related Nausea Only and Other (See Comments)    Dizziness     Physical Exam  Constitutional: She is oriented to person, place, and time. She appears well-developed and well-nourished.  HENT:  Head: Normocephalic and atraumatic.  Eyes: Pupils are equal, round, and reactive to light. Conjunctivae  and EOM are normal.  Cardiovascular: Normal rate, regular rhythm, normal heart sounds and intact distal pulses.   Pulmonary/Chest: Effort normal and breath sounds normal.  Abdominal: Soft. Bowel sounds are normal.  Neurological: She is alert and oriented to person, place, and time. She has normal reflexes.  Skin: Skin is warm and dry. No rash noted.  Psychiatric: She has a normal mood and affect. Her  behavior is normal. Judgment and thought content normal.  Nursing note and vitals reviewed.       Assessment & Plan:   1. Episodic mood disorder (HCC) - QUEtiapine (SEROQUEL) 400 MG tablet; Take 2 tablets (800 mg total) by mouth at bedtime.  Dispense: 60 tablet; Refill: 1  2. Depression, recurrent ([place])  3. Primary insomnia - zolpidem (AMBIEN) 5 MG tablet; Take 1 tablet (5 mg total) by mouth at bedtime as needed for sleep.  Dispense: 15 tablet; Refill: 2    Current Outpatient Prescriptions:  .  acetaminophen (TYLENOL ARTHRITIS PAIN) 650 MG CR tablet, Take 650 mg by mouth every 8 (eight) hours as needed for pain., Disp: , Rfl:  .  ALPRAZolam (XANAX) 0.5 MG tablet, TAKE ONE OR TWO TABLETS BY MOUTH TWICE DAILY AS NEEDED FOR ANXIETY, Disp: 90 tablet, Rfl: 2 .  atorvastatin (LIPITOR) 20 MG tablet, Take 1 Tablet by mouth once daily, Disp: 90 tablet, Rfl: 0 .  buPROPion (WELLBUTRIN XL) 300 MG 24 hr tablet, Take 1 tablet (300 mg total) by mouth daily., Disp: 30 tablet, Rfl: 2 .  DULoxetine (CYMBALTA) 60 MG capsule, Take 1-2 capsules (60-120 mg total) by mouth daily., Disp: 60 capsule, Rfl: 5 .  Linaclotide (LINZESS) 290 MCG CAPS capsule, Take 1 capsule (290 mcg total) by mouth daily. (Patient taking differently: Take 290 mcg by mouth daily as needed (For constipation.). ), Disp: 90 capsule, Rfl: 3 .  ondansetron (ZOFRAN) 4 MG tablet, take 1  Tablet by mouth 3 times daily BEFORE meals, Disp: 90 tablet, Rfl: 1 .  pantoprazole (PROTONIX) 40 MG tablet, Take 1 tablet (40 mg total) by mouth daily. Take  30 minutes before breakfast daily., Disp: 90 tablet, Rfl: 3 .  amphetamine-dextroamphetamine (ADDERALL) 20 MG tablet, Take 1 Tablet by mouth every morning AND TAKE ONE TABLET BY MOUTH AT NOON, Disp: , Rfl: 0 .  lisinopril (PRINIVIL,ZESTRIL) 20 MG tablet, Take 1 Tablet by mouth once daily (Patient not taking: Reported on 02/13/2017), Disp: 30 tablet, Rfl: 5 .  QUEtiapine (SEROQUEL) 400 MG tablet, Take 2 tablets (800 mg total) by mouth at bedtime., Disp: 60 tablet, Rfl: 1 .  zolpidem (AMBIEN) 5 MG tablet, Take 1 tablet (5 mg total) by mouth at bedtime as needed for sleep., Disp: 15 tablet, Rfl: 2 Continue all other maintenance medications as listed above.  Follow up plan: Return in about 6 weeks (around 03/27/2017) for recheck.  Educational handout given for [place] PA-C [facility] [address]  [place], [place] [address] [phone]   02/14/2017, 8:34 AM

## 2017-02-13 NOTE — Patient Instructions (Signed)
Reduce remeron to 1/2 tab for few days, then off  Increase Seroquel to 800 mg  After few days, try Ambien

## 2017-03-28 ENCOUNTER — Encounter: Payer: Self-pay | Admitting: Physician Assistant

## 2017-03-28 ENCOUNTER — Ambulatory Visit: Payer: Medicaid Other | Admitting: Physician Assistant

## 2017-03-28 DIAGNOSIS — F39 Unspecified mood [affective] disorder: Secondary | ICD-10-CM | POA: Diagnosis not present

## 2017-03-28 DIAGNOSIS — F411 Generalized anxiety disorder: Secondary | ICD-10-CM | POA: Diagnosis not present

## 2017-03-28 MED ORDER — QUETIAPINE FUMARATE 400 MG PO TABS: 800.0000 mg | ORAL_TABLET | Freq: Every day | ORAL | 11 refills | Status: DC

## 2017-03-28 MED ORDER — ALPRAZOLAM 1 MG PO TABS: ORAL_TABLET | ORAL | 1 refills | Status: DC

## 2017-03-28 NOTE — Progress Notes (Signed)
BP (!) 149/99   Pulse (!) 105   Temp 98.6 F (37 C) (Oral)   Ht 5\' 2"  (1.575 m)   Wt 170 lb 3.2 oz (77.2 kg)   BMI 31.13 kg/m    Subjective:    Patient ID: [name], female    DOB: 08/06/1955, 61 y.o.   MRN: [id number]  HPI: [name] is a 61 y.o. female presenting on 03/28/2017 for Follow-up (6 week rck )  Patient comes in for recheck on her depression and anxiety.  She states overall she is feeling somewhat better.  The medications seem to be settling very well and she does need some refills.  She is taking 1 mg of Xanax in the morning half a milligram during the day and 1 mg in the evening.  She states that this overall controls things fairly well.  I have changed her strength so that she can have 1 mg rather than 0.5 mg otherwise she is doing okay no other refills are needed. Depression screen [facility] 2/9 03/28/2017 02/13/2017 01/13/2017 12/16/2016 11/17/2016  Decreased Interest 2 2 - 1 1  Down, Depressed, Hopeless 1 1 - 0 1  PHQ - 2 Score 3 3 - 1 2  Altered sleeping 1 1 0 - 1  Tired, decreased energy 1 1 1  - 1  Change in appetite 1 1 1  - 0  Feeling bad or failure about yourself  0 0 0 - 0  Trouble concentrating 1 0 0 - 0  Moving slowly or fidgety/restless 0 0 0 - 0  Suicidal thoughts 0 0 0 - 0  PHQ-9 Score 7 6 - - 4  Difficult doing work/chores - - - - Not difficult at all     Relevant past medical, surgical, family and social history reviewed and updated as indicated. Allergies and medications reviewed and updated.  Past Medical History:  Diagnosis Date  . Anxiety   . Arthritis   . Back pain, chronic   . Bipolar 1 disorder ([place])   . Chest pain    "STRESS"; had normal nuclear stress test 02/2011; denied recent history 02/10/14  . Depression   . Difficulty sleeping    mind racing  . GERD (gastroesophageal reflux disease)   . Heart murmur    childhood  . Hiatal hernia   . History of stomach ulcers   . Hypercholesterolemia   . Hypertension   . Palpitations       Past Surgical History:  Procedure Laterality Date  . APPENDECTOMY    . BACK SURGERY    . CARPAL TUNNEL RELEASE     BILATERAL  . COLONOSCOPY, ESOPHAGOGASTRODUODENOSCOPY (EGD) AND ESOPHAGEAL DILATION N/A 02/06/2013   RMR. Benign colon polyp, diverticulosis, hemorrhoids. EGD with Schatzki's ring s/p 22 F dilation, 50mm elliptical prepyloric antral ulcer with adjacent scarring, negative path  . ESOPHAGEAL MANOMETRY N/A 09/04/2015   Procedure: ESOPHAGEAL MANOMETRY (EM);  Surgeon: Wilford Corner, MD;  [facility];  Service: Endoscopy;  Laterality: N/A;  . ESOPHAGOGASTRODUODENOSCOPY N/A 09/23/2013   Dr. Gala Romney: non-critical Schatzki's ring, not manipulated, healed ulcer, gastric bx negative  . ESOPHAGOGASTRODUODENOSCOPY (EGD) WITH PROPOFOL N/A 08/10/2015   Procedure: ESOPHAGOGASTRODUODENOSCOPY (EGD) WITH PROPOFOL;  Surgeon: Daneil Dolin, MD;  [facility];  Service: Endoscopy;  Laterality: N/A;  0830  . INSERTION OF MESH N/A 10/02/2015   Procedure: INSERTION OF MESH;  Surgeon: [name], MD;  [facility];  Service: General;  Laterality: N/A;  . LUMBAR FUSION  02/12/2014   LEVEL 1 L4 L5    DR BROOKS  . MALONEY DILATION N/A 08/10/2015   Procedure: Venia Minks DILATION;  Surgeon: Daneil Dolin, MD;  [facility];  Service: Endoscopy;  Laterality: N/A;  . PARTIAL KNEE ARTHROPLASTY Left 07/15/2013   Procedure: UNICOMPARTMENTAL LEFT KNEE MEDIAL ;  Surgeon: [name], MD;  Location[facility];  Service: Orthopedics;  Laterality: Left;, Right uni knee done also.    Review of Systems  Constitutional: Negative.   HENT: Negative.   Eyes: Negative.   Respiratory: Negative.   Gastrointestinal: Negative.   Genitourinary: Negative.   Psychiatric/Behavioral: Positive for dysphoric mood. Negative for self-injury, sleep disturbance and suicidal ideas. The patient is nervous/anxious. The patient is not hyperactive.     Allergies as of 03/28/2017      Reactions    Morphine And Related Nausea Only, Other (See Comments)   Dizziness       Medication List        Accurate as of 03/28/17  4:01 PM. Always use your most recent med list.          ALPRAZolam 1 MG tablet Commonly known as:  XANAX Take one tablet up to TID for anxiety.   atorvastatin 20 MG tablet Commonly known as:  LIPITOR Take 1 Tablet by mouth once daily   buPROPion 300 MG 24 hr tablet Commonly known as:  WELLBUTRIN XL Take 1 tablet (300 mg total) by mouth daily.   DULoxetine 60 MG capsule Commonly known as:  CYMBALTA Take 1-2 capsules (60-120 mg total) by mouth daily.   linaclotide 290 MCG Caps capsule Commonly known as:  LINZESS Take 1 capsule (290 mcg total) by mouth daily.   ondansetron 4 MG tablet Commonly known as:  ZOFRAN take 1  Tablet by mouth 3 times daily BEFORE meals   pantoprazole 40 MG tablet Commonly known as:  PROTONIX Take 1 tablet (40 mg total) by mouth daily. Take 30 minutes before breakfast daily.   QUEtiapine 400 MG tablet Commonly known as:  SEROQUEL Take 2 tablets (800 mg total) by mouth at bedtime.   TYLENOL ARTHRITIS PAIN 650 MG CR tablet Generic drug:  acetaminophen Take 650 mg by mouth every 8 (eight) hours as needed for pain.   zolpidem 5 MG tablet Commonly known as:  AMBIEN Take 1 tablet (5 mg total) by mouth at bedtime as needed for sleep.          Objective:    BP (!) 149/99   Pulse (!) 105   Temp 98.6 F (37 C) (Oral)   Ht 5\' 2"  (1.575 m)   Wt 170 lb 3.2 oz (77.2 kg)   BMI 31.13 kg/m   Allergies  Allergen Reactions  . Morphine And Related Nausea Only and Other (See Comments)    Dizziness     Physical Exam  Constitutional: She is oriented to person, place, and time. She appears well-developed and well-nourished.  HENT:  Head: Normocephalic and atraumatic.  Eyes: Conjunctivae and EOM are normal. Pupils are equal, round, and reactive to light.  Cardiovascular: Normal rate, regular rhythm, normal heart sounds and  intact distal pulses.  Pulmonary/Chest: Effort normal and breath sounds normal.  Abdominal: Soft. Bowel sounds are normal.  Neurological: She is alert and oriented to person, place, and time. She has normal reflexes.  Skin: Skin is warm and dry. No rash noted.  Psychiatric: She has a normal mood and affect. Her behavior is normal. Judgment and thought content  normal.  Nursing note and vitals reviewed.       Assessment & Plan:   1. GAD (generalized anxiety disorder) - ALPRAZolam (XANAX) 1 MG tablet; Take one tablet up to TID for anxiety.  Dispense: 90 tablet; Refill: 1  2. Episodic mood disorder (HCC) - QUEtiapine (SEROQUEL) 400 MG tablet; Take 2 tablets (800 mg total) by mouth at bedtime.  Dispense: 60 tablet; Refill: 11    Current Outpatient Medications:  .  acetaminophen (TYLENOL ARTHRITIS PAIN) 650 MG CR tablet, Take 650 mg by mouth every 8 (eight) hours as needed for pain., Disp: , Rfl:  .  ALPRAZolam (XANAX) 1 MG tablet, Take one tablet up to TID for anxiety., Disp: 90 tablet, Rfl: 1 .  atorvastatin (LIPITOR) 20 MG tablet, Take 1 Tablet by mouth once daily, Disp: 90 tablet, Rfl: 0 .  buPROPion (WELLBUTRIN XL) 300 MG 24 hr tablet, Take 1 tablet (300 mg total) by mouth daily., Disp: 30 tablet, Rfl: 2 .  DULoxetine (CYMBALTA) 60 MG capsule, Take 1-2 capsules (60-120 mg total) by mouth daily., Disp: 60 capsule, Rfl: 5 .  Linaclotide (LINZESS) 290 MCG CAPS capsule, Take 1 capsule (290 mcg total) by mouth daily. (Patient taking differently: Take 290 mcg by mouth daily as needed (For constipation.). ), Disp: 90 capsule, Rfl: 3 .  ondansetron (ZOFRAN) 4 MG tablet, take 1  Tablet by mouth 3 times daily BEFORE meals, Disp: 90 tablet, Rfl: 1 .  pantoprazole (PROTONIX) 40 MG tablet, Take 1 tablet (40 mg total) by mouth daily. Take 30 minutes before breakfast daily., Disp: 90 tablet, Rfl: 3 .  QUEtiapine (SEROQUEL) 400 MG tablet, Take 2 tablets (800 mg total) by mouth at bedtime., Disp: 60  tablet, Rfl: 11 .  zolpidem (AMBIEN) 5 MG tablet, Take 1 tablet (5 mg total) by mouth at bedtime as needed for sleep., Disp: 15 tablet, Rfl: 2 Continue all other maintenance medications as listed above.  Follow up plan: Return in about 2 months (around 05/28/2017) for recheck.  Educational handout given for [place] PA-C [facility] [address]  [place], [place] [address] [phone]   03/28/2017, 4:01 PM

## 2017-03-28 NOTE — Patient Instructions (Signed)
In a few days you may receive a survey in the mail or online from [employer] regarding your visit with us today. Please take a moment to fill this out. Your feedback is very important to our whole office. It can help us better understand your needs as well as improve your experience and satisfaction. Thank you for taking your time to complete it. We care about you.  Salima Rumer, PA-C  

## 2017-05-30 ENCOUNTER — Ambulatory Visit: Payer: Medicaid Other | Admitting: Physician Assistant

## 2017-05-30 ENCOUNTER — Encounter: Payer: Self-pay | Admitting: Physician Assistant

## 2017-05-30 VITALS — BP 138/86 | HR 91 | Temp 98.1°F | Ht 62.0 in | Wt 172.2 lb

## 2017-05-30 DIAGNOSIS — R635 Abnormal weight gain: Secondary | ICD-10-CM | POA: Diagnosis not present

## 2017-05-30 DIAGNOSIS — F39 Unspecified mood [affective] disorder: Secondary | ICD-10-CM

## 2017-05-30 DIAGNOSIS — Z0000 Encounter for general adult medical examination without abnormal findings: Secondary | ICD-10-CM

## 2017-05-30 DIAGNOSIS — F5101 Primary insomnia: Secondary | ICD-10-CM

## 2017-05-30 DIAGNOSIS — F411 Generalized anxiety disorder: Secondary | ICD-10-CM

## 2017-05-30 MED ORDER — BUPROPION HCL ER (XL) 300 MG PO TB24: 300.0000 mg | ORAL_TABLET | Freq: Every day | ORAL | 3 refills | Status: DC

## 2017-05-30 MED ORDER — ALPRAZOLAM 1 MG PO TABS: ORAL_TABLET | ORAL | 2 refills | Status: DC

## 2017-05-30 MED ORDER — LINACLOTIDE 290 MCG PO CAPS: 290.0000 ug | ORAL_CAPSULE | Freq: Every day | ORAL | 11 refills | Status: DC | PRN

## 2017-05-30 MED ORDER — ATORVASTATIN CALCIUM 20 MG PO TABS: 20.0000 mg | ORAL_TABLET | Freq: Every day | ORAL | 3 refills | Status: DC

## 2017-05-30 MED ORDER — PANTOPRAZOLE SODIUM 40 MG PO TBEC: 40.0000 mg | DELAYED_RELEASE_TABLET | Freq: Every day | ORAL | 3 refills | Status: DC

## 2017-05-30 MED ORDER — DULOXETINE HCL 60 MG PO CPEP: 60.0000 mg | ORAL_CAPSULE | Freq: Every day | ORAL | 3 refills | Status: DC

## 2017-05-30 MED ORDER — ZOLPIDEM TARTRATE 5 MG PO TABS
5.0000 mg | ORAL_TABLET | Freq: Every evening | ORAL | 5 refills | Status: DC | PRN
Start: 2017-05-30 — End: 2017-09-05

## 2017-05-30 NOTE — Patient Instructions (Signed)
In a few days you may receive a survey in the mail or online from [employer] regarding your visit with us today. Please take a moment to fill this out. Your feedback is very important to our whole office. It can help us better understand your needs as well as improve your experience and satisfaction. Thank you for taking your time to complete it. We care about you.  Aydia Maj, PA-C  

## 2017-05-30 NOTE — Progress Notes (Signed)
BP 138/86   Pulse 91   Temp 98.1 F (36.7 C) (Oral)   Ht '5\' 2"'$  (1.575 m)   Wt 172 lb 3.2 oz (78.1 kg)   BMI 31.50 kg/m    Subjective:    Patient ID: [name], female    DOB: 02/07/1956, 62 y.o.   MRN: [id number]  HPI: [name] is a 62 y.o. female presenting on 05/30/2017 for Follow-up (2 month )  This patient comes in for periodic recheck on medications and conditions including generalized anxiety, mood disorder, insomnia.  She has had some weight gain.  She states overall she is feeling better.  She does get more down during the winter months.  She describes no significant stressors at this time.  Her PHQ is stable and less than 9. Depression screen [facility] 2/9 05/30/2017 03/28/2017 02/13/2017 01/13/2017 12/16/2016  Decreased Interest '3 2 2 '$ - 1  Down, Depressed, Hopeless '1 1 1 '$ - 0  PHQ - 2 Score '4 3 3 '$ - 1  Altered sleeping 0 1 1 0 -  Tired, decreased energy '1 1 1 1 '$ -  Change in appetite '1 1 1 1 '$ -  Feeling bad or failure about yourself  0 0 0 0 -  Trouble concentrating 0 1 0 0 -  Moving slowly or fidgety/restless 0 0 0 0 -  Suicidal thoughts 0 0 0 0 -  PHQ-9 Score '6 7 6 '$ - -  Difficult doing work/chores - - - - -  Some recent data might be hidden   .   All medications are reviewed today. There are no reports of any problems with the medications. All of the medical conditions are reviewed and updated.  Lab work is reviewed and will be ordered as medically necessary. There are no new problems reported with today's visit.   Relevant past medical, surgical, family and social history reviewed and updated as indicated. Allergies and medications reviewed and updated.  Past Medical History:  Diagnosis Date  . Anxiety   . Arthritis   . Back pain, chronic   . Bipolar 1 disorder ([place])   . Chest pain    "STRESS"; had normal nuclear stress test 02/2011; denied recent history 02/10/14  . Depression   . Difficulty sleeping    mind racing  . GERD (gastroesophageal reflux disease)    . Heart murmur    childhood  . Hiatal hernia   . History of stomach ulcers   . Hypercholesterolemia   . Hypertension   . Palpitations     Past Surgical History:  Procedure Laterality Date  . APPENDECTOMY    . BACK SURGERY    . CARPAL TUNNEL RELEASE     BILATERAL  . COLONOSCOPY, ESOPHAGOGASTRODUODENOSCOPY (EGD) AND ESOPHAGEAL DILATION N/A 02/06/2013   RMR. Benign colon polyp, diverticulosis, hemorrhoids. EGD with Schatzki's ring s/p 67 F dilation, 19m elliptical prepyloric antral ulcer with adjacent scarring, negative path  . ESOPHAGEAL MANOMETRY N/A 09/04/2015   Procedure: ESOPHAGEAL MANOMETRY (EM);  Surgeon: VWilford Corner MD;  Location: [facility];  Service: Endoscopy;  Laterality: N/A;  . ESOPHAGOGASTRODUODENOSCOPY N/A 09/23/2013   Dr. RGala Romney non-critical Schatzki's ring, not manipulated, healed ulcer, gastric bx negative  . ESOPHAGOGASTRODUODENOSCOPY (EGD) WITH PROPOFOL N/A 08/10/2015   Procedure: ESOPHAGOGASTRODUODENOSCOPY (EGD) WITH PROPOFOL;  Surgeon: RDaneil Dolin MD;  Location: [facility];  Service: Endoscopy;  Laterality: N/A;  0830  . INSERTION OF MESH N/A 10/02/2015   Procedure: INSERTION OF MESH;  Surgeon: ARalene Ok MD;  Location: [facility] ORS;  Service: General;  Laterality: N/A;  . LUMBAR FUSION  02/12/2014   LEVEL 1 L4 L5    DR BROOKS  . MALONEY DILATION N/A 08/10/2015   Procedure: Venia Minks DILATION;  Surgeon: Daneil Dolin, MD;  Location: [facility];  Service: Endoscopy;  Laterality: N/A;  . PARTIAL KNEE ARTHROPLASTY Left 07/15/2013   Procedure: UNICOMPARTMENTAL LEFT KNEE MEDIAL ;  Surgeon: [name], MD;  Location: [facility];  Service: Orthopedics;  Laterality: Left;, Right uni knee done also.    Review of Systems  Constitutional: Negative.  Negative for activity change, fatigue and fever.  HENT: Negative.   Eyes: Negative.   Respiratory: Negative.  Negative for cough.   Cardiovascular: Negative.  Negative for chest pain.  Gastrointestinal: Negative.   Negative for abdominal pain.  Endocrine: Negative.   Genitourinary: Negative.  Negative for dysuria.  Musculoskeletal: Negative.   Skin: Negative.   Neurological: Negative.   Psychiatric/Behavioral: Positive for decreased concentration and sleep disturbance. The patient is nervous/anxious.     Allergies as of 05/30/2017      Reactions   Morphine And Related Nausea Only, Other (See Comments)   Dizziness       Medication List        Accurate as of 05/30/17 12:40 PM. Always use your most recent med list.          ALPRAZolam 1 MG tablet Commonly known as:  XANAX Take one tablet up to TID for anxiety.   atorvastatin 20 MG tablet Commonly known as:  LIPITOR Take 1 tablet (20 mg total) by mouth daily.   buPROPion 300 MG 24 hr tablet Commonly known as:  WELLBUTRIN XL Take 1 tablet (300 mg total) by mouth daily.   DULoxetine 60 MG capsule Commonly known as:  CYMBALTA Take 1-2 capsules (60-120 mg total) by mouth daily.   linaclotide 290 MCG Caps capsule Commonly known as:  LINZESS Take 1 capsule (290 mcg total) by mouth daily as needed (For constipation.).   ondansetron 4 MG tablet Commonly known as:  ZOFRAN take 1  Tablet by mouth 3 times daily BEFORE meals   pantoprazole 40 MG tablet Commonly known as:  PROTONIX Take 1 tablet (40 mg total) by mouth daily. Take 30 minutes before breakfast daily.   QUEtiapine 400 MG tablet Commonly known as:  SEROQUEL Take 2 tablets (800 mg total) by mouth at bedtime.   TYLENOL ARTHRITIS PAIN 650 MG CR tablet Generic drug:  acetaminophen Take 650 mg by mouth every 8 (eight) hours as needed for pain.   zolpidem 5 MG tablet Commonly known as:  AMBIEN Take 1 tablet (5 mg total) by mouth at bedtime as needed for sleep.          Objective:    BP 138/86   Pulse 91   Temp 98.1 F (36.7 C) (Oral)   Ht '5\' 2"'$  (1.575 m)   Wt 172 lb 3.2 oz (78.1 kg)   BMI 31.50 kg/m   Allergies  Allergen Reactions  . Morphine And Related Nausea  Only and Other (See Comments)    Dizziness     Physical Exam  Constitutional: She is oriented to person, place, and time. She appears well-developed and well-nourished.  HENT:  Head: Normocephalic and atraumatic.  Eyes: Conjunctivae and EOM are normal. Pupils are equal, round, and reactive to light.  Cardiovascular: Normal rate, regular rhythm, normal heart sounds and intact distal pulses.  Pulmonary/Chest: Effort normal and breath sounds normal.  Abdominal: Soft. Bowel sounds are normal.  Neurological: She is alert and oriented to person, place, and time. She has normal reflexes.  Skin: Skin is warm and dry. No rash noted.  Psychiatric: She has a normal mood and affect. Her behavior is normal. Judgment and thought content normal.  Nursing note and vitals reviewed.   Results for orders placed or performed in visit on 09/26/16  CMP14+EGFR  Result Value Ref Range   Glucose 81 65 - 99 mg/dL   BUN 13 8 - 27 mg/dL   Creatinine, Ser 0.95 0.57 - 1.00 mg/dL   GFR calc non Af Amer 65 >59 mL/min/1.73   GFR calc Af Amer 75 >59 mL/min/1.73   BUN/Creatinine Ratio 14 12 - 28   Sodium 142 134 - 144 mmol/L   Potassium 4.1 3.5 - 5.2 mmol/L   Chloride 102 96 - 106 mmol/L   CO2 28 18 - 29 mmol/L   Calcium 9.7 8.7 - 10.3 mg/dL   Total Protein 6.9 6.0 - 8.5 g/dL   Albumin 4.2 3.6 - 4.8 g/dL   Globulin, Total 2.7 1.5 - 4.5 g/dL   Albumin/Globulin Ratio 1.6 1.2 - 2.2   Bilirubin Total 0.3 0.0 - 1.2 mg/dL   Alkaline Phosphatase 112 39 - 117 IU/L   AST 17 0 - 40 IU/L   ALT 10 0 - 32 IU/L  Lipid panel  Result Value Ref Range   Cholesterol, Total 233 (H) 100 - 199 mg/dL   Triglycerides 96 0 - 149 mg/dL   HDL 70 >39 mg/dL   VLDL Cholesterol Cal 19 5 - 40 mg/dL   LDL Calculated 144 (H) 0 - 99 mg/dL   Chol/HDL Ratio 3.3 0.0 - 4.4 ratio      Assessment & Plan:   1. GAD (generalized anxiety disorder) - ALPRAZolam (XANAX) 1 MG tablet; Take one tablet up to TID for anxiety.  Dispense: 90 tablet;  Refill: 2  2. Episodic mood disorder (HCC) - DULoxetine (CYMBALTA) 60 MG capsule; Take 1-2 capsules (60-120 mg total) by mouth daily.  Dispense: 180 capsule; Refill: 3 - buPROPion (WELLBUTRIN XL) 300 MG 24 hr tablet; Take 1 tablet (300 mg total) by mouth daily.  Dispense: 90 tablet; Refill: 3  3. Primary insomnia - zolpidem (AMBIEN) 5 MG tablet; Take 1 tablet (5 mg total) by mouth at bedtime as needed for sleep.  Dispense: 15 tablet; Refill: 5  4. Well adult exam - CBC with Differential/Platelet - CMP14+EGFR - Lipid panel - TSH  5. Weight gain - CBC with Differential/Platelet - TSH    Current Outpatient Medications:  .  acetaminophen (TYLENOL ARTHRITIS PAIN) 650 MG CR tablet, Take 650 mg by mouth every 8 (eight) hours as needed for pain., Disp: , Rfl:  .  ALPRAZolam (XANAX) 1 MG tablet, Take one tablet up to TID for anxiety., Disp: 90 tablet, Rfl: 2 .  atorvastatin (LIPITOR) 20 MG tablet, Take 1 tablet (20 mg total) by mouth daily., Disp: 90 tablet, Rfl: 3 .  buPROPion (WELLBUTRIN XL) 300 MG 24 hr tablet, Take 1 tablet (300 mg total) by mouth daily., Disp: 90 tablet, Rfl: 3 .  DULoxetine (CYMBALTA) 60 MG capsule, Take 1-2 capsules (60-120 mg total) by mouth daily., Disp: 180 capsule, Rfl: 3 .  linaclotide (LINZESS) 290 MCG CAPS capsule, Take 1 capsule (290 mcg total) by mouth daily as needed (For constipation.)., Disp: 30 capsule, Rfl: 11 .  ondansetron (ZOFRAN) 4 MG tablet, take 1  Tablet by mouth 3 times  daily BEFORE meals, Disp: 90 tablet, Rfl: 1 .  pantoprazole (PROTONIX) 40 MG tablet, Take 1 tablet (40 mg total) by mouth daily. Take 30 minutes before breakfast daily., Disp: 90 tablet, Rfl: 3 .  QUEtiapine (SEROQUEL) 400 MG tablet, Take 2 tablets (800 mg total) by mouth at bedtime., Disp: 60 tablet, Rfl: 11 .  zolpidem (AMBIEN) 5 MG tablet, Take 1 tablet (5 mg total) by mouth at bedtime as needed for sleep., Disp: 15 tablet, Rfl: 5 Continue all other maintenance medications as  listed above.  Follow up plan: Return in about 3 months (around 08/28/2017).  Educational handout given for [place] PA-C [place] [address]  [place], [place] [address] [phone]   05/30/2017, 12:40 PM

## 2017-05-31 LAB — CBC WITH DIFFERENTIAL/PLATELET
BASOS ABS: 0 10*3/uL (ref 0.0–0.2)
Basos: 1 %
EOS (ABSOLUTE): 0.2 10*3/uL (ref 0.0–0.4)
Eos: 3 %
HEMOGLOBIN: 11.6 g/dL (ref 11.1–15.9)
Hematocrit: 36.2 % (ref 34.0–46.6)
IMMATURE GRANS (ABS): 0 10*3/uL (ref 0.0–0.1)
Immature Granulocytes: 0 %
LYMPHS ABS: 1.6 10*3/uL (ref 0.7–3.1)
LYMPHS: 26 %
MCH: 29.1 pg (ref 26.6–33.0)
MCHC: 32 g/dL (ref 31.5–35.7)
MCV: 91 fL (ref 79–97)
Monocytes Absolute: 0.4 10*3/uL (ref 0.1–0.9)
Monocytes: 7 %
Neutrophils Absolute: 3.8 10*3/uL (ref 1.4–7.0)
Neutrophils: 63 %
PLATELETS: 221 10*3/uL (ref 150–379)
RBC: 3.99 x10E6/uL (ref 3.77–5.28)
RDW: 14.8 % (ref 12.3–15.4)
WBC: 6 10*3/uL (ref 3.4–10.8)

## 2017-05-31 LAB — CMP14+EGFR
ALBUMIN: 4.4 g/dL (ref 3.6–4.8)
ALK PHOS: 116 IU/L (ref 39–117)
ALT: 11 IU/L (ref 0–32)
AST: 20 IU/L (ref 0–40)
Albumin/Globulin Ratio: 1.6 (ref 1.2–2.2)
BUN / CREAT RATIO: 14 (ref 12–28)
BUN: 14 mg/dL (ref 8–27)
Bilirubin Total: <0.2 mg/dL (ref 0.0–1.2)
CHLORIDE: 103 mmol/L (ref 96–106)
CO2: 21 mmol/L (ref 20–29)
CREATININE: 0.99 mg/dL (ref 0.57–1.00)
Calcium: 9.5 mg/dL (ref 8.7–10.3)
GFR calc Af Amer: 71 mL/min/{1.73_m2} (ref >59)
GFR calc non Af Amer: 62 mL/min/{1.73_m2} (ref >59)
GLUCOSE: 81 mg/dL (ref 65–99)
Globulin, Total: 2.8 g/dL (ref 1.5–4.5)
Potassium: 3.9 mmol/L (ref 3.5–5.2)
Sodium: 142 mmol/L (ref 134–144)
Total Protein: 7.2 g/dL (ref 6.0–8.5)

## 2017-05-31 LAB — LIPID PANEL
CHOLESTEROL TOTAL: 242 mg/dL — AB (ref 100–199)
Chol/HDL Ratio: 3.7 ratio (ref 0.0–4.4)
HDL: 66 mg/dL (ref >39)
LDL CALC: 145 mg/dL — AB (ref 0–99)
TRIGLYCERIDES: 157 mg/dL — AB (ref 0–149)
VLDL CHOLESTEROL CAL: 31 mg/dL (ref 5–40)

## 2017-05-31 LAB — TSH: TSH: 4.06 u[IU]/mL (ref 0.450–4.500)

## 2017-08-26 ENCOUNTER — Other Ambulatory Visit: Payer: Self-pay | Admitting: Physician Assistant

## 2017-08-26 DIAGNOSIS — F411 Generalized anxiety disorder: Secondary | ICD-10-CM

## 2017-08-28 NOTE — Telephone Encounter (Signed)
Last seen 05/30/17  [name] 

## 2017-09-05 ENCOUNTER — Ambulatory Visit: Payer: Medicaid Other

## 2017-09-05 ENCOUNTER — Ambulatory Visit: Payer: Medicaid Other | Admitting: Physician Assistant

## 2017-09-05 ENCOUNTER — Encounter: Payer: Self-pay | Admitting: Physician Assistant

## 2017-09-05 VITALS — BP 132/88 | HR 95 | Temp 98.6°F | Ht 62.0 in | Wt 170.6 lb

## 2017-09-05 DIAGNOSIS — F5101 Primary insomnia: Secondary | ICD-10-CM

## 2017-09-05 DIAGNOSIS — Z1231 Encounter for screening mammogram for malignant neoplasm of breast: Secondary | ICD-10-CM | POA: Diagnosis not present

## 2017-09-05 DIAGNOSIS — F411 Generalized anxiety disorder: Secondary | ICD-10-CM

## 2017-09-05 DIAGNOSIS — Z0000 Encounter for general adult medical examination without abnormal findings: Secondary | ICD-10-CM

## 2017-09-05 LAB — HM MAMMOGRAPHY

## 2017-09-05 MED ORDER — ZOLPIDEM TARTRATE 5 MG PO TABS: 5.0000 mg | ORAL_TABLET | Freq: Every evening | ORAL | 5 refills | Status: DC | PRN

## 2017-09-05 MED ORDER — ALPRAZOLAM 1 MG PO TABS: ORAL_TABLET | ORAL | 5 refills | Status: DC

## 2017-09-05 NOTE — Progress Notes (Signed)
BP 132/88   Pulse 95   Temp 98.6 F (37 C) (Oral)   Ht '5\' 2"'$  (1.575 m)   Wt 170 lb 9.6 oz (77.4 kg)   BMI 31.20 kg/m    Subjective:    Patient ID: [name], female    DOB: Jan 06, 1956, 62 y.o.   MRN: [id number]  HPI: [name] is a 62 y.o. female presenting on 09/05/2017 for Follow-up (3 month )  This patient comes in for a 61-monthrecheck on her conditions.  She states that her insomnia and anxiety have been much better.  She is well controlled on the medication she is taking.  She still does not feel like doing a lot of activity.  But she is trying to get better doing this.  Past Medical History:  Diagnosis Date  . Anxiety   . Arthritis   . Back pain, chronic   . Bipolar 1 disorder ([place]   . Chest pain    "STRESS"; had normal nuclear stress test 02/2011; denied recent history 02/10/14  . Depression   . Difficulty sleeping    mind racing  . GERD (gastroesophageal reflux disease)   . Heart murmur    childhood  . Hiatal hernia   . History of stomach ulcers   . Hypercholesterolemia   . Hypertension   . Palpitations    Relevant past medical, surgical, family and social history reviewed and updated as indicated. Interim medical history since our last visit reviewed. Allergies and medications reviewed and updated. DATA REVIEWED: CHART IN EPIC  Family History reviewed for pertinent findings.  Review of Systems  Constitutional: Negative.   HENT: Negative.   Eyes: Negative.   Respiratory: Negative.   Gastrointestinal: Negative.   Genitourinary: Negative.     Allergies as of 09/05/2017      Reactions   Morphine And Related Nausea Only, Other (See Comments)   Dizziness       Medication List        Accurate as of 09/05/17 11:03 PM. Always use your most recent med list.          ALPRAZolam 1 MG tablet Commonly known as:  XANAX TAKE 1 TABLET BY MOUTH THREE TIMES A DAY FOR ANXIETY   atorvastatin 20 MG tablet Commonly known as:  LIPITOR Take 1  tablet (20 mg total) by mouth daily.   buPROPion 300 MG 24 hr tablet Commonly known as:  WELLBUTRIN XL Take 1 tablet (300 mg total) by mouth daily.   DULoxetine 60 MG capsule Commonly known as:  CYMBALTA Take 1-2 capsules (60-120 mg total) by mouth daily.   linaclotide 290 MCG Caps capsule Commonly known as:  LINZESS Take 1 capsule (290 mcg total) by mouth daily as needed (For constipation.).   ondansetron 4 MG tablet Commonly known as:  ZOFRAN take 1  Tablet by mouth 3 times daily BEFORE meals   pantoprazole 40 MG tablet Commonly known as:  PROTONIX Take 1 tablet (40 mg total) by mouth daily. Take 30 minutes before breakfast daily.   QUEtiapine 400 MG tablet Commonly known as:  SEROQUEL Take 2 tablets (800 mg total) by mouth at bedtime.   TYLENOL ARTHRITIS PAIN 650 MG CR tablet Generic drug:  acetaminophen Take 650 mg by mouth every 8 (eight) hours as needed for pain.   zolpidem 5 MG tablet Commonly known as:  AMBIEN Take 1 tablet (5 mg total) by mouth at bedtime as needed for sleep.  Objective:    BP 132/88   Pulse 95   Temp 98.6 F (37 C) (Oral)   Ht '5\' 2"'$  (1.575 m)   Wt 170 lb 9.6 oz (77.4 kg)   BMI 31.20 kg/m   Allergies  Allergen Reactions  . Morphine And Related Nausea Only and Other (See Comments)    Dizziness     Wt Readings from Last 3 Encounters:  09/05/17 170 lb 9.6 oz (77.4 kg)  05/30/17 172 lb 3.2 oz (78.1 kg)  03/28/17 170 lb 3.2 oz (77.2 kg)    Physical Exam  Constitutional: She is oriented to person, place, and time. She appears well-developed and well-nourished.  HENT:  Head: Normocephalic and atraumatic.  Eyes: Pupils are equal, round, and reactive to light. Conjunctivae and EOM are normal.  Cardiovascular: Normal rate, regular rhythm, normal heart sounds and intact distal pulses.  Pulmonary/Chest: Effort normal and breath sounds normal.  Abdominal: Soft. Bowel sounds are normal.  Neurological: She is alert and oriented to  person, place, and time. She has normal reflexes.  Skin: Skin is warm and dry. No rash noted.  Psychiatric: She has a normal mood and affect. Her behavior is normal. Judgment and thought content normal.    Results for orders placed or performed in visit on 05/30/17  CBC with Differential/Platelet  Result Value Ref Range   WBC 6.0 3.4 - 10.8 x10E3/uL   RBC 3.99 3.77 - 5.28 x10E6/uL   Hemoglobin 11.6 11.1 - 15.9 g/dL   Hematocrit 36.2 34.0 - 46.6 %   MCV 91 79 - 97 fL   MCH 29.1 26.6 - 33.0 pg   MCHC 32.0 31.5 - 35.7 g/dL   RDW 14.8 12.3 - 15.4 %   Platelets 221 150 - 379 x10E3/uL   Neutrophils 63 Not Estab. %   Lymphs 26 Not Estab. %   Monocytes 7 Not Estab. %   Eos 3 Not Estab. %   Basos 1 Not Estab. %   Neutrophils Absolute 3.8 1.4 - 7.0 x10E3/uL   Lymphocytes Absolute 1.6 0.7 - 3.1 x10E3/uL   Monocytes Absolute 0.4 0.1 - 0.9 x10E3/uL   EOS (ABSOLUTE) 0.2 0.0 - 0.4 x10E3/uL   Basophils Absolute 0.0 0.0 - 0.2 x10E3/uL   Immature Granulocytes 0 Not Estab. %   Immature Grans (Abs) 0.0 0.0 - 0.1 x10E3/uL  CMP14+EGFR  Result Value Ref Range   Glucose 81 65 - 99 mg/dL   BUN 14 8 - 27 mg/dL   Creatinine, Ser 0.99 0.57 - 1.00 mg/dL   GFR calc non Af Amer 62 >59 mL/min/1.73   GFR calc Af Amer 71 >59 mL/min/1.73   BUN/Creatinine Ratio 14 12 - 28   Sodium 142 134 - 144 mmol/L   Potassium 3.9 3.5 - 5.2 mmol/L   Chloride 103 96 - 106 mmol/L   CO2 21 20 - 29 mmol/L   Calcium 9.5 8.7 - 10.3 mg/dL   Total Protein 7.2 6.0 - 8.5 g/dL   Albumin 4.4 3.6 - 4.8 g/dL   Globulin, Total 2.8 1.5 - 4.5 g/dL   Albumin/Globulin Ratio 1.6 1.2 - 2.2   Bilirubin Total <0.2 0.0 - 1.2 mg/dL   Alkaline Phosphatase 116 39 - 117 IU/L   AST 20 0 - 40 IU/L   ALT 11 0 - 32 IU/L  Lipid panel  Result Value Ref Range   Cholesterol, Total 242 (H) 100 - 199 mg/dL   Triglycerides 157 (H) 0 - 149 mg/dL   HDL 66 >39  mg/dL   VLDL Cholesterol Cal 31 5 - 40 mg/dL   LDL Calculated 145 (H) 0 - 99 mg/dL    Chol/HDL Ratio 3.7 0.0 - 4.4 ratio  TSH  Result Value Ref Range   TSH 4.060 0.450 - 4.500 uIU/mL      Assessment & Plan:   1. Primary insomnia - zolpidem (AMBIEN) 5 MG tablet; Take 1 tablet (5 mg total) by mouth at bedtime as needed for sleep.  Dispense: 15 tablet; Refill: 5  2. GAD (generalized anxiety disorder) - ALPRAZolam (XANAX) 1 MG tablet; TAKE 1 TABLET BY MOUTH THREE TIMES A DAY FOR ANXIETY  Dispense: 90 tablet; Refill: 5  3. Well adult exam   Continue all other maintenance medications as listed above.  Follow up plan: Return in about 6 months (around 03/07/2018) for recheck.  Educational handout given for [place] PA-C [facility] [address]  [place], [place] [address] [phone]   09/05/2017, 11:03 PM

## 2017-09-05 NOTE — Patient Instructions (Signed)
Premature Ventricular Contraction A premature ventricular contraction (PVC) is a common irregularity in the normal heart rhythm. These contractions are extra heartbeats that start in the heart ventricles and occur too early in the normal sequence. During the PVC, the heart's normal electrical pathway is not used, so the beat is shorter and less effective. In most cases, these contractions come and go and do not require treatment. What are the causes? In many cases, the cause may not be known. Common causes of the condition include:  Smoking.  Drinking alcohol.  Caffeine.  Certain medicines.  Some illegal drugs.  Stress.  Certain medical conditions can also cause PVCs:  Changes in minerals in the blood (electrolytes).  Heart failure.  Heart valve problems.  Low blood oxygen levels or high carbon dioxide levels.  Heart attack, or coronary artery disease.  What are the signs or symptoms? The main symptom of this condition is a fast or skipped heartbeat (palpitations). Other symptoms include:  Chest pain.  Shortness of breath.  Feeling tired.  Dizziness.  In some cases, there are no symptoms. How is this diagnosed? This condition may be diagnosed based on:  Your medical history.  A physical exam. During the exam, the health care provider will check for irregular heartbeats.  Tests, such as: ? An ECG (electrocardiogram) to monitor the electrical activity of your heart. ? Holter monitor testing. This involves wearing a device that clips to your clothing and monitors the electrical activity of your heart over longer periods of time. ? Stress tests to see how exercise affects your heart rhythm and blood supply. ? Echocardiogram. This test uses sound waves (ultrasound) to produce an image of your heart. ? Electrophysiology study. This test checks the electric pathways in your heart.  How is this treated? Treatment depends on any underlying conditions, the type of PVCs  that you are having, and how much the symptoms are interfering with your daily life. Possible treatments include:  Avoiding things that can trigger the premature contractions, such as caffeine or alcohol.  Medicines. These may be given if symptoms are severe or if the extra heartbeats are frequent.  Treatment for any underlying condition that is found to be the cause of the contractions.  Catheter ablation. This procedure destroys the heart tissues that send abnormal signals.  In some cases, no treatment is required. Follow these instructions at home: Lifestyle Follow these instructions as told by your health care provider:  Do not use any products that contain nicotine or tobacco, such as cigarettes and e-cigarettes. If you need help quitting, ask your health care provider.  If caffeine triggers episodes of PVC, do not eat, drink, or use anything with caffeine in it.  If caffeine does not seem to trigger episodes, consume caffeine in moderation.  If alcohol triggers episodes of PVC, do not drink alcohol.  If alcohol does not seem to trigger episodes, limit alcohol intake to no more than 1 drink a day for nonpregnant women and 2 drinks a day for men. One drink equals 12 oz of beer, 5 oz of wine, or 1 oz of hard liquor.  Exercise regularly. Ask your health care provider what type of exercise is safe for you.  Find healthy ways to manage stress. Avoid stressful situations when possible.  Try to get at least 7-9 hours of sleep each night, or as much as recommended by your health care provider.  Do not use illegal drugs.  General instructions  Take over-the-counter and prescription medicines only   as told by your health care provider.  Keep all follow-up visits as told by your health care provider. This is important. Get help right away if:  You feel palpitations that are frequent or continual.  You have chest pain.  You have shortness of breath.  You have sweating for no  reason.  You have nausea and vomiting.  You become light-headed or you faint. This information is not intended to replace advice given to you by your health care provider. Make sure you discuss any questions you have with your health care provider. Document Released: 12/11/2003 Document Revised: 12/18/2015 Document Reviewed: 09/30/2015 Elsevier Interactive Patient Education  2018 [employer]  

## 2017-09-28 ENCOUNTER — Other Ambulatory Visit: Payer: Self-pay | Admitting: Family Medicine

## 2017-09-28 DIAGNOSIS — F411 Generalized anxiety disorder: Secondary | ICD-10-CM

## 2018-03-05 ENCOUNTER — Other Ambulatory Visit: Payer: Self-pay | Admitting: Physician Assistant

## 2018-03-05 DIAGNOSIS — F411 Generalized anxiety disorder: Secondary | ICD-10-CM

## 2018-03-05 NOTE — Telephone Encounter (Signed)
Last seen 09/05/17  [facility]

## 2018-03-07 ENCOUNTER — Encounter: Payer: Self-pay | Admitting: Physician Assistant

## 2018-03-07 ENCOUNTER — Ambulatory Visit: Payer: Medicaid Other | Admitting: Physician Assistant

## 2018-03-07 VITALS — BP 134/99 | HR 102 | Temp 98.8°F | Ht 62.0 in | Wt 176.2 lb

## 2018-03-07 DIAGNOSIS — F39 Unspecified mood [affective] disorder: Secondary | ICD-10-CM | POA: Diagnosis not present

## 2018-03-07 DIAGNOSIS — F411 Generalized anxiety disorder: Secondary | ICD-10-CM

## 2018-03-07 DIAGNOSIS — F1190 Opioid use, unspecified, uncomplicated: Secondary | ICD-10-CM

## 2018-03-07 DIAGNOSIS — F5101 Primary insomnia: Secondary | ICD-10-CM

## 2018-03-07 DIAGNOSIS — G43809 Other migraine, not intractable, without status migrainosus: Secondary | ICD-10-CM

## 2018-03-07 MED ORDER — SUMATRIPTAN SUCCINATE 50 MG PO TABS: 50.0000 mg | ORAL_TABLET | ORAL | 0 refills | Status: DC | PRN

## 2018-03-07 MED ORDER — ALPRAZOLAM 1 MG PO TABS: ORAL_TABLET | ORAL | 5 refills | Status: DC

## 2018-03-07 MED ORDER — DULOXETINE HCL 60 MG PO CPEP: 60.0000 mg | ORAL_CAPSULE | Freq: Every day | ORAL | 3 refills | Status: DC

## 2018-03-07 MED ORDER — QUETIAPINE FUMARATE 400 MG PO TABS: 800.0000 mg | ORAL_TABLET | Freq: Every day | ORAL | 11 refills | Status: DC

## 2018-03-07 MED ORDER — LINACLOTIDE 290 MCG PO CAPS: 290.0000 ug | ORAL_CAPSULE | Freq: Every day | ORAL | 11 refills | Status: DC | PRN

## 2018-03-07 MED ORDER — ZOLPIDEM TARTRATE 5 MG PO TABS: 5.0000 mg | ORAL_TABLET | Freq: Every evening | ORAL | 5 refills | Status: DC | PRN

## 2018-03-07 MED ORDER — ATORVASTATIN CALCIUM 20 MG PO TABS: 20.0000 mg | ORAL_TABLET | Freq: Every day | ORAL | 3 refills | Status: DC

## 2018-03-07 MED ORDER — BUPROPION HCL ER (XL) 300 MG PO TB24: 300.0000 mg | ORAL_TABLET | Freq: Every day | ORAL | 3 refills | Status: DC

## 2018-03-07 MED ORDER — PANTOPRAZOLE SODIUM 40 MG PO TBEC: 40.0000 mg | DELAYED_RELEASE_TABLET | Freq: Every day | ORAL | 3 refills | Status: DC

## 2018-03-07 MED ORDER — TOPIRAMATE 25 MG PO TABS: 25.0000 mg | ORAL_TABLET | Freq: Two times a day (BID) | ORAL | 1 refills | Status: DC

## 2018-03-11 DIAGNOSIS — G43909 Migraine, unspecified, not intractable, without status migrainosus: Secondary | ICD-10-CM | POA: Insufficient documentation

## 2018-03-11 DIAGNOSIS — F1190 Opioid use, unspecified, uncomplicated: Secondary | ICD-10-CM | POA: Insufficient documentation

## 2018-03-11 LAB — TOXASSURE SELECT 13 (MW), URINE

## 2018-03-11 NOTE — Progress Notes (Signed)
BP (!) 134/99   Pulse (!) 102   Temp 98.8 F (37.1 C) (Oral)   Ht 5\' 2"  (1.575 m)   Wt 176 lb 3.2 oz (79.9 kg)   BMI 32.23 kg/m    Subjective:    Patient ID: [name], female    DOB: 04-01-1956, 62 y.o.   MRN: [id number]  HPI: [name] is a 62 y.o. female presenting on 03/07/2018 for Medical Management of Chronic Issues (six month recheck)  This patient comes in for periodic recheck on medications and conditions including GAD, mood disorder, insomnia, migraine. She has been stable with her mediations.  She is updated on her narcotic contract and drug screen.   All medications are reviewed today. There are no reports of any problems with the medications. All of the medical conditions are reviewed and updated.  Lab work is reviewed and will be ordered as medically necessary. There are no new problems reported with today's visit.   Past Medical History:  Diagnosis Date  . Anxiety   . Arthritis   . Back pain, chronic   . Bipolar 1 disorder ([place])   . Chest pain    "STRESS"; had normal nuclear stress test 02/2011; denied recent history 02/10/14  . Depression   . Difficulty sleeping    mind racing  . GERD (gastroesophageal reflux disease)   . Heart murmur    childhood  . Hiatal hernia   . History of stomach ulcers   . Hypercholesterolemia   . Hypertension   . Palpitations    Relevant past medical, surgical, family and social history reviewed and updated as indicated. Interim medical history since our last visit reviewed. Allergies and medications reviewed and updated. DATA REVIEWED: CHART IN EPIC  Family History reviewed for pertinent findings.  Review of Systems  Constitutional: Negative.  Negative for activity change, fatigue and fever.  HENT: Negative.   Eyes: Negative.   Respiratory: Negative.  Negative for cough.   Cardiovascular: Negative.  Negative for chest pain.  Gastrointestinal: Negative.  Negative for abdominal pain.  Endocrine: Negative.     Genitourinary: Negative.  Negative for dysuria.  Musculoskeletal: Negative.   Skin: Negative.   Neurological: Negative.     Allergies as of 03/07/2018      Reactions   Morphine And Related Nausea Only, Other (See Comments)   Dizziness       Medication List        Accurate as of 03/07/18 11:59 PM. Always use your most recent med list.          ALPRAZolam 1 MG tablet Commonly known as:  XANAX TAKE 1 TABLET BY MOUTH THREE TIMES A DAY FOR ANXIETY   atorvastatin 20 MG tablet Commonly known as:  LIPITOR Take 1 tablet (20 mg total) by mouth daily.   buPROPion 300 MG 24 hr tablet Commonly known as:  WELLBUTRIN XL Take 1 tablet (300 mg total) by mouth daily.   DULoxetine 60 MG capsule Commonly known as:  CYMBALTA Take 1-2 capsules (60-120 mg total) by mouth daily.   linaclotide 290 MCG Caps capsule Commonly known as:  LINZESS Take 1 capsule (290 mcg total) by mouth daily as needed (For constipation.).   ondansetron 4 MG tablet Commonly known as:  ZOFRAN take 1  Tablet by mouth 3 times daily BEFORE meals   pantoprazole 40 MG tablet Commonly known as:  PROTONIX Take 1 tablet (40 mg total) by mouth daily. Take 30 minutes before breakfast daily.  QUEtiapine 400 MG tablet Commonly known as:  SEROQUEL Take 2 tablets (800 mg total) by mouth at bedtime.   SUMAtriptan 50 MG tablet Commonly known as:  IMITREX Take 1 tablet (50 mg total) by mouth every 2 (two) hours as needed for migraine. May repeat in 2 hours if headache persists or recurs. Headache Treatment   topiramate 25 MG tablet Commonly known as:  TOPAMAX Take 1-4 tablets (25-100 mg total) by mouth 2 (two) times daily. Headache prevention   TYLENOL ARTHRITIS PAIN 650 MG CR tablet Generic drug:  acetaminophen Take 650 mg by mouth every 8 (eight) hours as needed for pain.   zolpidem 5 MG tablet Commonly known as:  AMBIEN Take 1 tablet (5 mg total) by mouth at bedtime as needed for sleep.           Objective:    BP (!) 134/99   Pulse (!) 102   Temp 98.8 F (37.1 C) (Oral)   Ht 5\' 2"  (1.575 m)   Wt 176 lb 3.2 oz (79.9 kg)   BMI 32.23 kg/m   Allergies  Allergen Reactions  . Morphine And Related Nausea Only and Other (See Comments)    Dizziness     Wt Readings from Last 3 Encounters:  03/07/18 176 lb 3.2 oz (79.9 kg)  09/05/17 170 lb 9.6 oz (77.4 kg)  05/30/17 172 lb 3.2 oz (78.1 kg)    Physical Exam  Constitutional: She is oriented to person, place, and time. She appears well-developed and well-nourished.  HENT:  Head: Normocephalic and atraumatic.  Eyes: Pupils are equal, round, and reactive to light. Conjunctivae and EOM are normal.  Cardiovascular: Normal rate, regular rhythm, normal heart sounds and intact distal pulses.  Pulmonary/Chest: Effort normal and breath sounds normal.  Abdominal: Soft. Bowel sounds are normal.  Neurological: She is alert and oriented to person, place, and time. She has normal reflexes.  Skin: Skin is warm and dry. No rash noted.  Psychiatric: She has a normal mood and affect. Her behavior is normal. Judgment and thought content normal.    Results for orders placed or performed in visit on 03/07/18  ToxASSURE Select 13 (MW), Urine  Result Value Ref Range   Summary FINAL       Assessment & Plan:   1. GAD (generalized anxiety disorder) - ALPRAZolam (XANAX) 1 MG tablet; TAKE 1 TABLET BY MOUTH THREE TIMES A DAY FOR ANXIETY  Dispense: 90 tablet; Refill: 5  2. Episodic mood disorder (HCC) - buPROPion (WELLBUTRIN XL) 300 MG 24 hr tablet; Take 1 tablet (300 mg total) by mouth daily.  Dispense: 90 tablet; Refill: 3 - DULoxetine (CYMBALTA) 60 MG capsule; Take 1-2 capsules (60-120 mg total) by mouth daily.  Dispense: 180 capsule; Refill: 3 - QUEtiapine (SEROQUEL) 400 MG tablet; Take 2 tablets (800 mg total) by mouth at bedtime.  Dispense: 60 tablet; Refill: 11  3. Primary insomnia - zolpidem (AMBIEN) 5 MG tablet; Take 1 tablet (5 mg total)  by mouth at bedtime as needed for sleep.  Dispense: 15 tablet; Refill: 5  4. Other migraine without status migrainosus, not intractable - topiramate (TOPAMAX) 25 MG tablet; Take 1-4 tablets (25-100 mg total) by mouth 2 (two) times daily. Headache prevention  Dispense: 120 tablet; Refill: 1 - SUMAtriptan (IMITREX) 50 MG tablet; Take 1 tablet (50 mg total) by mouth every 2 (two) hours as needed for migraine. May repeat in 2 hours if headache persists or recurs. Headache Treatment  Dispense: 10 tablet; Refill: 0  5. Narcotic drug use - ToxASSURE Select 13 (MW), Urine   Continue all other maintenance medications as listed above.  Follow up plan: Return in about 4 weeks (around 04/04/2018) for recheck.  Educational handout given for [place] PA-C [facility] [address]  [place], [place] [address] [phone]   03/11/2018, 10:32 PM

## 2018-05-16 ENCOUNTER — Other Ambulatory Visit: Payer: Self-pay | Admitting: Physician Assistant

## 2018-05-16 DIAGNOSIS — G43809 Other migraine, not intractable, without status migrainosus: Secondary | ICD-10-CM

## 2018-05-17 NOTE — Telephone Encounter (Signed)
Next OV 09/07/18 (55m rck)

## 2018-09-03 ENCOUNTER — Other Ambulatory Visit: Payer: Self-pay | Admitting: Physician Assistant

## 2018-09-03 DIAGNOSIS — F411 Generalized anxiety disorder: Secondary | ICD-10-CM

## 2018-09-07 ENCOUNTER — Ambulatory Visit (INDEPENDENT_AMBULATORY_CARE_PROVIDER_SITE_OTHER): Payer: Medicaid Other | Admitting: Physician Assistant

## 2018-09-07 ENCOUNTER — Other Ambulatory Visit: Payer: Self-pay

## 2018-09-07 DIAGNOSIS — G43809 Other migraine, not intractable, without status migrainosus: Secondary | ICD-10-CM

## 2018-09-07 DIAGNOSIS — F411 Generalized anxiety disorder: Secondary | ICD-10-CM

## 2018-09-07 DIAGNOSIS — H66001 Acute suppurative otitis media without spontaneous rupture of ear drum, right ear: Secondary | ICD-10-CM | POA: Diagnosis not present

## 2018-09-07 DIAGNOSIS — F5101 Primary insomnia: Secondary | ICD-10-CM

## 2018-09-07 DIAGNOSIS — Z0000 Encounter for general adult medical examination without abnormal findings: Secondary | ICD-10-CM

## 2018-09-07 MED ORDER — SUMATRIPTAN SUCCINATE 50 MG PO TABS: 50.0000 mg | ORAL_TABLET | ORAL | 5 refills | Status: DC | PRN

## 2018-09-07 MED ORDER — ZOLPIDEM TARTRATE 5 MG PO TABS: 5.0000 mg | ORAL_TABLET | Freq: Every evening | ORAL | 5 refills | Status: DC | PRN

## 2018-09-07 MED ORDER — AMOXICILLIN 500 MG PO CAPS: 500.0000 mg | ORAL_CAPSULE | Freq: Three times a day (TID) | ORAL | 0 refills | Status: DC

## 2018-09-07 MED ORDER — TOPIRAMATE 100 MG PO TABS: 100.0000 mg | ORAL_TABLET | Freq: Two times a day (BID) | ORAL | 11 refills | Status: DC

## 2018-09-07 MED ORDER — ALPRAZOLAM 1 MG PO TABS: ORAL_TABLET | ORAL | 5 refills | Status: DC

## 2018-09-09 ENCOUNTER — Encounter: Payer: Self-pay | Admitting: Physician Assistant

## 2018-09-09 NOTE — Progress Notes (Signed)
Telephone visit  Subjective: CC: Chronic medical conditions PCP: Terald Sleeper, PA-C ATF:[id number] [name] is a 63 y.o. female calls for telephone consult today. Patient provides verbal consent for consult held via phone.  Patient is identified with 2 separate identifiers.  At this time the entire area is on COVID-19 social distancing and stay home orders are in place.  Patient is of higher risk and therefore we are performing this by a virtual method.  Location of patient: Home Location of provider: [facility] Others present for call: No  This is a periodic recheck for the patient's chronic medical conditions.  She recently had to have her father placed in a nursing home.  It is been quite stressful but she is almost relieved that he will not be wandering.  He had developed fairly advanced dementia.  She states she has been having pain in her right ear and she does occasionally get ear infections.  She has had quite severe spring allergies.  Her medications are reviewed.  She does have continued generalized anxiety and does need her medications refilled.  ANXIETY ASSESSMENT Cause of anxiety: GAD This patient returns for a  month recheck on narcotic use for the above named condition(s)  Current medications-720 mg daily Wellbutrin Seroquel 400 mg 2 at bedtime Alprazolam 1 mg TID Other medications tried: many antidepressants Medication side effects- no Any concerns- no Any change in general medical condition- no Effectiveness of current meds- good PMP AWARE website reviewed: Yes Any suspicious activity on PMP Aware: No LME daily dose: 6  Contract on file 03/09/18 Last UDS  Obtain at next vist  History of overdose or risk of abuse no   ROS: Per HPI  Allergies  Allergen Reactions  . Morphine And Related Nausea Only and Other (See Comments)    Dizziness    Past Medical History:  Diagnosis Date  . Anxiety   . Arthritis   . Back pain, chronic   . Bipolar 1 disorder  ([place])   . Chest pain    "STRESS"; had normal nuclear stress test 02/2011; denied recent history 02/10/14  . Depression   . Difficulty sleeping    mind racing  . GERD (gastroesophageal reflux disease)   . Heart murmur    childhood  . Hiatal hernia   . History of stomach ulcers   . Hypercholesterolemia   . Hypertension   . Palpitations     Current Outpatient Medications:  .  acetaminophen (TYLENOL ARTHRITIS PAIN) 650 MG CR tablet, Take 650 mg by mouth every 8 (eight) hours as needed for pain., Disp: , Rfl:  .  ALPRAZolam (XANAX) 1 MG tablet, TAKE 1 TABLET BY MOUTH 3 TIMES A DAY FOR ANXIETY.  Make appointment to be seen in office, Disp: 90 tablet, Rfl: 5 .  amoxicillin (AMOXIL) 500 MG capsule, Take 1 capsule (500 mg total) by mouth 3 (three) times daily., Disp: 30 capsule, Rfl: 0 .  atorvastatin (LIPITOR) 20 MG tablet, Take 1 tablet (20 mg total) by mouth daily., Disp: 90 tablet, Rfl: 3 .  buPROPion (WELLBUTRIN XL) 300 MG 24 hr tablet, Take 1 tablet (300 mg total) by mouth daily., Disp: 90 tablet, Rfl: 3 .  DULoxetine (CYMBALTA) 60 MG capsule, Take 1-2 capsules (60-120 mg total) by mouth daily., Disp: 180 capsule, Rfl: 3 .  linaclotide (LINZESS) 290 MCG CAPS capsule, Take 1 capsule (290 mcg total) by mouth daily as needed (For constipation.)., Disp: 30 capsule, Rfl: 11 .  ondansetron (ZOFRAN) 4 MG tablet, take 1  Tablet by mouth 3 times daily BEFORE meals, Disp: 90 tablet, Rfl: 1 .  pantoprazole (PROTONIX) 40 MG tablet, Take 1 tablet (40 mg total) by mouth daily. Take 30 minutes before breakfast daily., Disp: 90 tablet, Rfl: 3 .  QUEtiapine (SEROQUEL) 400 MG tablet, Take 2 tablets (800 mg total) by mouth at bedtime., Disp: 60 tablet, Rfl: 11 .  SUMAtriptan (IMITREX) 50 MG tablet, Take 1 tablet (50 mg total) by mouth every 2 (two) hours as needed for migraine. May repeat in 2 hours if headache persists or recurs. Headache Treatment, Disp: 10 tablet, Rfl: 5 .  topiramate (TOPAMAX) 100 MG  tablet, Take 1 tablet (100 mg total) by mouth 2 (two) times daily. Headache prevention, Disp: 60 tablet, Rfl: 11 .  zolpidem (AMBIEN) 5 MG tablet, Take 1 tablet (5 mg total) by mouth at bedtime as needed for sleep., Disp: 15 tablet, Rfl: 5  Assessment/ Plan: 63 y.o. female   1. Other migraine without status migrainosus, not intractable - SUMAtriptan (IMITREX) 50 MG tablet; Take 1 tablet (50 mg total) by mouth every 2 (two) hours as needed for migraine. May repeat in 2 hours if headache persists or recurs. Headache Treatment  Dispense: 10 tablet; Refill: 5 - topiramate (TOPAMAX) 100 MG tablet; Take 1 tablet (100 mg total) by mouth 2 (two) times daily. Headache prevention  Dispense: 60 tablet; Refill: 11  2. Primary insomnia - zolpidem (AMBIEN) 5 MG tablet; Take 1 tablet (5 mg total) by mouth at bedtime as needed for sleep.  Dispense: 15 tablet; Refill: 5  3. GAD (generalized anxiety disorder) - ALPRAZolam (XANAX) 1 MG tablet; TAKE 1 TABLET BY MOUTH 3 TIMES A DAY FOR ANXIETY.  Make appointment to be seen in office  Dispense: 90 tablet; Refill: 5  4. Non-recurrent acute suppurative otitis media of right ear without spontaneous rupture of tympanic membrane - amoxicillin (AMOXIL) 500 MG capsule; Take 1 capsule (500 mg total) by mouth 3 (three) times daily.  Dispense: 30 capsule; Refill: 0   Start time: 1:45 PM End time: 1:52 PM  Meds ordered this encounter  Medications  . SUMAtriptan (IMITREX) 50 MG tablet    Sig: Take 1 tablet (50 mg total) by mouth every 2 (two) hours as needed for migraine. May repeat in 2 hours if headache persists or recurs. Headache Treatment    Dispense:  10 tablet    Refill:  5    Order Specific Question:   Supervising Provider    Answer:   Janora Norlander [[id number]]  . topiramate (TOPAMAX) 100 MG tablet    Sig: Take 1 tablet (100 mg total) by mouth 2 (two) times daily. Headache prevention    Dispense:  60 tablet    Refill:  11    Order Specific Question:    Supervising Provider    Answer:   Janora Norlander [[id number]]  . zolpidem (AMBIEN) 5 MG tablet    Sig: Take 1 tablet (5 mg total) by mouth at bedtime as needed for sleep.    Dispense:  15 tablet    Refill:  5    Order Specific Question:   Supervising Provider    Answer:   Janora Norlander [[id number]]  . ALPRAZolam (XANAX) 1 MG tablet    Sig: TAKE 1 TABLET BY MOUTH 3 TIMES A DAY FOR ANXIETY.  Make appointment to be seen in office    Dispense:  90 tablet    Refill:  5    This request is for a new prescription for a controlled substance as required by Federal/State law. DX Code Needed  .    Order Specific Question:   Supervising Provider    Answer:   Janora Norlander [[id number]]  . amoxicillin (AMOXIL) 500 MG capsule    Sig: Take 1 capsule (500 mg total) by mouth 3 (three) times daily.    Dispense:  30 capsule    Refill:  0    Order Specific Question:   Supervising Provider    Answer:   Janora Norlander [[id number]]    Particia Nearing PA-C [place] [phone]

## 2019-03-08 ENCOUNTER — Other Ambulatory Visit: Payer: Self-pay | Admitting: Physician Assistant

## 2019-03-08 DIAGNOSIS — F411 Generalized anxiety disorder: Secondary | ICD-10-CM

## 2019-03-11 ENCOUNTER — Other Ambulatory Visit: Payer: Self-pay | Admitting: Physician Assistant

## 2019-03-11 DIAGNOSIS — F411 Generalized anxiety disorder: Secondary | ICD-10-CM

## 2019-03-22 ENCOUNTER — Other Ambulatory Visit: Payer: Self-pay

## 2019-03-25 ENCOUNTER — Other Ambulatory Visit: Payer: Self-pay

## 2019-03-25 ENCOUNTER — Ambulatory Visit (INDEPENDENT_AMBULATORY_CARE_PROVIDER_SITE_OTHER): Payer: Medicaid Other | Admitting: Physician Assistant

## 2019-03-25 ENCOUNTER — Encounter: Payer: Self-pay | Admitting: Physician Assistant

## 2019-03-25 DIAGNOSIS — F5101 Primary insomnia: Secondary | ICD-10-CM | POA: Diagnosis not present

## 2019-03-25 DIAGNOSIS — G43809 Other migraine, not intractable, without status migrainosus: Secondary | ICD-10-CM

## 2019-03-25 DIAGNOSIS — F39 Unspecified mood [affective] disorder: Secondary | ICD-10-CM | POA: Diagnosis not present

## 2019-03-25 DIAGNOSIS — Z0000 Encounter for general adult medical examination without abnormal findings: Secondary | ICD-10-CM

## 2019-03-25 DIAGNOSIS — F411 Generalized anxiety disorder: Secondary | ICD-10-CM | POA: Diagnosis not present

## 2019-03-25 MED ORDER — ATORVASTATIN CALCIUM 20 MG PO TABS: 20.0000 mg | ORAL_TABLET | Freq: Every day | ORAL | 3 refills | Status: DC

## 2019-03-25 MED ORDER — BUPROPION HCL ER (XL) 300 MG PO TB24: 300.0000 mg | ORAL_TABLET | Freq: Every day | ORAL | 3 refills | Status: DC

## 2019-03-25 MED ORDER — DULOXETINE HCL 60 MG PO CPEP: 60.0000 mg | ORAL_CAPSULE | Freq: Every day | ORAL | 3 refills | Status: DC

## 2019-03-25 MED ORDER — ZOLPIDEM TARTRATE 5 MG PO TABS: 5.0000 mg | ORAL_TABLET | Freq: Every evening | ORAL | 5 refills | Status: DC | PRN

## 2019-03-25 MED ORDER — PANTOPRAZOLE SODIUM 40 MG PO TBEC: 40.0000 mg | DELAYED_RELEASE_TABLET | Freq: Every day | ORAL | 3 refills | Status: DC

## 2019-03-25 MED ORDER — ALPRAZOLAM 1 MG PO TABS: ORAL_TABLET | ORAL | 5 refills | Status: DC

## 2019-03-25 MED ORDER — SUMATRIPTAN SUCCINATE 50 MG PO TABS: 50.0000 mg | ORAL_TABLET | ORAL | 5 refills | Status: DC | PRN

## 2019-03-25 MED ORDER — LINACLOTIDE 290 MCG PO CAPS: 290.0000 ug | ORAL_CAPSULE | Freq: Every day | ORAL | 11 refills | Status: DC | PRN

## 2019-03-25 MED ORDER — QUETIAPINE FUMARATE 400 MG PO TABS: 800.0000 mg | ORAL_TABLET | Freq: Every day | ORAL | 11 refills | Status: DC

## 2019-03-26 LAB — CBC WITH DIFFERENTIAL/PLATELET
Basophils Absolute: 0.1 10*3/uL (ref 0.0–0.2)
Basos: 1 %
EOS (ABSOLUTE): 0.1 10*3/uL (ref 0.0–0.4)
Eos: 2 %
Hematocrit: 39.4 % (ref 34.0–46.6)
Hemoglobin: 13.5 g/dL (ref 11.1–15.9)
Immature Grans (Abs): 0 10*3/uL (ref 0.0–0.1)
Immature Granulocytes: 0 %
Lymphocytes Absolute: 2.2 10*3/uL (ref 0.7–3.1)
Lymphs: 32 %
MCH: 30.4 pg (ref 26.6–33.0)
MCHC: 34.3 g/dL (ref 31.5–35.7)
MCV: 89 fL (ref 79–97)
Monocytes Absolute: 0.6 10*3/uL (ref 0.1–0.9)
Monocytes: 9 %
Neutrophils Absolute: 3.8 10*3/uL (ref 1.4–7.0)
Neutrophils: 56 %
Platelets: 247 10*3/uL (ref 150–450)
RBC: 4.44 x10E6/uL (ref 3.77–5.28)
RDW: 14.1 % (ref 11.7–15.4)
WBC: 6.9 10*3/uL (ref 3.4–10.8)

## 2019-03-26 LAB — CMP14+EGFR
ALT: 15 IU/L (ref 0–32)
AST: 20 IU/L (ref 0–40)
Albumin/Globulin Ratio: 2 (ref 1.2–2.2)
Albumin: 4.9 g/dL — ABNORMAL HIGH (ref 3.8–4.8)
Alkaline Phosphatase: 140 IU/L — ABNORMAL HIGH (ref 39–117)
BUN/Creatinine Ratio: 19 (ref 12–28)
BUN: 17 mg/dL (ref 8–27)
Bilirubin Total: 0.3 mg/dL (ref 0.0–1.2)
CO2: 23 mmol/L (ref 20–29)
Calcium: 10.1 mg/dL (ref 8.7–10.3)
Chloride: 104 mmol/L (ref 96–106)
Creatinine, Ser: 0.9 mg/dL (ref 0.57–1.00)
GFR calc Af Amer: 79 mL/min/{1.73_m2} (ref >59)
GFR calc non Af Amer: 68 mL/min/{1.73_m2} (ref >59)
Globulin, Total: 2.5 g/dL (ref 1.5–4.5)
Glucose: 99 mg/dL (ref 65–99)
Potassium: 3.7 mmol/L (ref 3.5–5.2)
Sodium: 143 mmol/L (ref 134–144)
Total Protein: 7.4 g/dL (ref 6.0–8.5)

## 2019-03-26 LAB — LIPID PANEL
Chol/HDL Ratio: 2.9 ratio (ref 0.0–4.4)
Cholesterol, Total: 205 mg/dL — ABNORMAL HIGH (ref 100–199)
HDL: 70 mg/dL (ref >39)
LDL Chol Calc (NIH): 113 mg/dL — ABNORMAL HIGH (ref 0–99)
Triglycerides: 125 mg/dL (ref 0–149)
VLDL Cholesterol Cal: 22 mg/dL (ref 5–40)

## 2019-03-26 LAB — TSH: TSH: 1.49 u[IU]/mL (ref 0.450–4.500)

## 2019-03-31 NOTE — Progress Notes (Signed)
BP (!) 150/91   Pulse 88   Temp 98.1 F (36.7 C) (Temporal)   Ht _0  (1.575 m)   Wt 170 lb (77.1 kg)   SpO2 98%   BMI 31.09 kg/m    Subjective:    Patient ID: [name], female    DOB: 1955-11-27, 63 y.o.   MRN: [id number]  HPI: [name] is a 63 y.o. female presenting on 03/25/2019 for Medication Refill and Medical Management of Chronic Issues (6 month )  This patient comes in for periodic recheck on her chronic medical conditions.  They do include hyperlipidemia, chronic constipation, depression and anxiety, migraine, GERD.  She states that overall she is fairly stable.  She is not having any new issues.  She will have labs performed today to check for her annual labs.  She does need refills.  She has not had any other complaints at this time.  ANXIETY ASSESSMENT Cause of anxiety: GAD This patient returns for a  month recheck on narcotic use for the above named condition(s)  Current medications- Wellbutrin 300 mg  QD Seroquel 400 mg 2 at bedtime Alprazolam 1 mg TID, starting to titrate down by 1/2 tablet a day she can retry the do that over the next month and then increase it more if possible  Other medications tried: many antidepressants Medication side effects- no Any concerns- no Any change in general medical condition- no Effectiveness of current meds- good PMP AWARE website reviewed: Yes Any suspicious activity on PMP Aware: No LME daily dose: 6  Contract on file 03/25/19 Last UDS    History of overdose or risk of abuse no   Past Medical History:  Diagnosis Date  . Anxiety   . Arthritis   . Back pain, chronic   . Bipolar 1 disorder ([place])   . Chest pain    "STRESS"; had normal nuclear stress test 02/2011; denied recent history 02/10/14  . Depression   . Difficulty sleeping    mind racing  . GERD (gastroesophageal reflux disease)   . Heart murmur    childhood  . Hiatal hernia   . History of stomach ulcers   . Hypercholesterolemia   .  Hypertension   . Palpitations    Relevant past medical, surgical, family and social history reviewed and updated as indicated. Interim medical history since our last visit reviewed. Allergies and medications reviewed and updated. DATA REVIEWED: CHART IN EPIC  Family History reviewed for pertinent findings.  Review of Systems  Constitutional: Negative.  Negative for activity change, fatigue and fever.  HENT: Negative.   Eyes: Negative.   Respiratory: Negative.  Negative for cough.   Cardiovascular: Negative.  Negative for chest pain.  Gastrointestinal: Negative.  Negative for abdominal pain.  Endocrine: Negative.   Genitourinary: Negative.  Negative for dysuria.  Musculoskeletal: Negative.   Skin: Negative.   Neurological: Negative.     Allergies as of 03/25/2019      Reactions   Morphine And Related Nausea Only, Other (See Comments)   Dizziness       Medication List       Accurate as of March 25, 2019 11:59 PM. If you have any questions, ask your nurse or doctor.        STOP taking these medications   amoxicillin 500 MG capsule Commonly known as: AMOXIL Stopped by: Terald Sleeper, PA-C     TAKE these medications   ALPRAZolam 1 MG tablet Commonly known as: XANAX Take 1  tab QAM, 1/2 tab in the day and 1 tab QPM What changed: additional instructions Changed by: Terald Sleeper, PA-C   atorvastatin 20 MG tablet Commonly known as: LIPITOR Take 1 tablet (20 mg total) by mouth daily.   buPROPion 300 MG 24 hr tablet Commonly known as: WELLBUTRIN XL Take 1 tablet (300 mg total) by mouth daily.   DULoxetine 60 MG capsule Commonly known as: Cymbalta Take 1-2 capsules (60-120 mg total) by mouth daily.   linaclotide 290 MCG Caps capsule Commonly known as: Linzess Take 1 capsule (290 mcg total) by mouth daily as needed (For constipation.).   ondansetron 4 MG tablet Commonly known as: ZOFRAN take 1  Tablet by mouth 3 times daily BEFORE meals   pantoprazole 40 MG  tablet Commonly known as: PROTONIX Take 1 tablet (40 mg total) by mouth daily. Take 30 minutes before breakfast daily.   QUEtiapine 400 MG tablet Commonly known as: SEROquel Take 2 tablets (800 mg total) by mouth at bedtime.   SUMAtriptan 50 MG tablet Commonly known as: IMITREX Take 1 tablet (50 mg total) by mouth every 2 (two) hours as needed for migraine. May repeat in 2 hours if headache persists or recurs. Headache Treatment   topiramate 100 MG tablet Commonly known as: TOPAMAX Take 1 tablet (100 mg total) by mouth 2 (two) times daily. Headache prevention   Tylenol Arthritis Pain 650 MG CR tablet Generic drug: acetaminophen Take 650 mg by mouth every 8 (eight) hours as needed for pain.   zolpidem 5 MG tablet Commonly known as: AMBIEN Take 1 tablet (5 mg total) by mouth at bedtime as needed for sleep.          Objective:    BP (!) 150/91   Pulse 88   Temp 98.1 F (36.7 C) (Temporal)   Ht _0  (1.575 m)   Wt 170 lb (77.1 kg)   SpO2 98%   BMI 31.09 kg/m   Allergies  Allergen Reactions  . Morphine And Related Nausea Only and Other (See Comments)    Dizziness     Wt Readings from Last 3 Encounters:  03/25/19 170 lb (77.1 kg)  03/07/18 176 lb 3.2 oz (79.9 kg)  09/05/17 170 lb 9.6 oz (77.4 kg)    Physical Exam Constitutional:      General: She is not in acute distress.    Appearance: Normal appearance. She is well-developed.  HENT:     Head: Normocephalic and atraumatic.  Cardiovascular:     Rate and Rhythm: Normal rate.  Pulmonary:     Effort: Pulmonary effort is normal.  Skin:    General: Skin is warm and dry.     Findings: No rash.  Neurological:     Mental Status: She is alert and oriented to person, place, and time.     Deep Tendon Reflexes: Reflexes are normal and symmetric.     Results for orders placed or performed in visit on 03/25/19  TSH  Result Value Ref Range   TSH 1.490 0.450 - 4.500 uIU/mL  Lipid panel  Result Value Ref Range    Cholesterol, Total 205 (H) 100 - 199 mg/dL   Triglycerides 125 0 - 149 mg/dL   HDL 70 >39 mg/dL   VLDL Cholesterol Cal 22 5 - 40 mg/dL   LDL Chol Calc (NIH) 113 (H) 0 - 99 mg/dL   Chol/HDL Ratio 2.9 0.0 - 4.4 ratio  CMP14+EGFR  Result Value Ref Range   Glucose 99 65 -  99 mg/dL   BUN 17 8 - 27 mg/dL   Creatinine, Ser 0.90 0.57 - 1.00 mg/dL   GFR calc non Af Amer 68 >59 mL/min/1.73   GFR calc Af Amer 79 >59 mL/min/1.73   BUN/Creatinine Ratio 19 12 - 28   Sodium 143 134 - 144 mmol/L   Potassium 3.7 3.5 - 5.2 mmol/L   Chloride 104 96 - 106 mmol/L   CO2 23 20 - 29 mmol/L   Calcium 10.1 8.7 - 10.3 mg/dL   Total Protein 7.4 6.0 - 8.5 g/dL   Albumin 4.9 (H) 3.8 - 4.8 g/dL   Globulin, Total 2.5 1.5 - 4.5 g/dL   Albumin/Globulin Ratio 2.0 1.2 - 2.2   Bilirubin Total 0.3 0.0 - 1.2 mg/dL   Alkaline Phosphatase 140 (H) 39 - 117 IU/L   AST 20 0 - 40 IU/L   ALT 15 0 - 32 IU/L  CBC with Differential/Platelet  Result Value Ref Range   WBC 6.9 3.4 - 10.8 x10E3/uL   RBC 4.44 3.77 - 5.28 x10E6/uL   Hemoglobin 13.5 11.1 - 15.9 g/dL   Hematocrit 39.4 34.0 - 46.6 %   MCV 89 79 - 97 fL   MCH 30.4 26.6 - 33.0 pg   MCHC 34.3 31.5 - 35.7 g/dL   RDW 14.1 11.7 - 15.4 %   Platelets 247 150 - 450 x10E3/uL   Neutrophils 56 Not Estab. %   Lymphs 32 Not Estab. %   Monocytes 9 Not Estab. %   Eos 2 Not Estab. %   Basos 1 Not Estab. %   Neutrophils Absolute 3.8 1.4 - 7.0 x10E3/uL   Lymphocytes Absolute 2.2 0.7 - 3.1 x10E3/uL   Monocytes Absolute 0.6 0.1 - 0.9 x10E3/uL   EOS (ABSOLUTE) 0.1 0.0 - 0.4 x10E3/uL   Basophils Absolute 0.1 0.0 - 0.2 x10E3/uL   Immature Granulocytes 0 Not Estab. %   Immature Grans (Abs) 0.0 0.0 - 0.1 x10E3/uL      Assessment & Plan:   1. GAD (generalized anxiety disorder) - ALPRAZolam (XANAX) 1 MG tablet; Take 1 tab QAM, 1/2 tab in the day and 1 tab QPM  Dispense: 75 tablet; Refill: 5  2. Primary insomnia - zolpidem (AMBIEN) 5 MG tablet; Take 1 tablet (5 mg total) by  mouth at bedtime as needed for sleep.  Dispense: 15 tablet; Refill: 5  3. Other migraine without status migrainosus, not intractable - SUMAtriptan (IMITREX) 50 MG tablet; Take 1 tablet (50 mg total) by mouth every 2 (two) hours as needed for migraine. May repeat in 2 hours if headache persists or recurs. Headache Treatment  Dispense: 10 tablet; Refill: 5  4. Episodic mood disorder (HCC) - QUEtiapine (SEROQUEL) 400 MG tablet; Take 2 tablets (800 mg total) by mouth at bedtime.  Dispense: 60 tablet; Refill: 11 - DULoxetine (CYMBALTA) 60 MG capsule; Take 1-2 capsules (60-120 mg total) by mouth daily.  Dispense: 180 capsule; Refill: 3 - buPROPion (WELLBUTRIN XL) 300 MG 24 hr tablet; Take 1 tablet (300 mg total) by mouth daily.  Dispense: 90 tablet; Refill: 3  5. Well adult health check - TSH - Lipid panel - CMP14+EGFR - CBC with Differential/Platelet   Continue all other maintenance medications as listed above.  Follow up plan: Return in about 6 months (around 09/22/2019).  Educational handout given for [place] PA-C [facility] [address]  [place], [place] [address] [phone]   03/31/2019, 3:35 PM

## 2019-08-26 ENCOUNTER — Encounter: Payer: Self-pay | Admitting: *Deleted

## 2019-09-24 ENCOUNTER — Ambulatory Visit (INDEPENDENT_AMBULATORY_CARE_PROVIDER_SITE_OTHER): Payer: Medicaid Other | Admitting: Nurse Practitioner

## 2019-09-24 ENCOUNTER — Encounter: Payer: Self-pay | Admitting: Nurse Practitioner

## 2019-09-24 ENCOUNTER — Other Ambulatory Visit: Payer: Self-pay

## 2019-09-24 ENCOUNTER — Ambulatory Visit: Payer: Medicaid Other | Admitting: Physician Assistant

## 2019-09-24 VITALS — BP 138/86 | HR 117 | Temp 97.1°F | Resp 20 | Ht 62.0 in | Wt 183.0 lb

## 2019-09-24 DIAGNOSIS — I10 Essential (primary) hypertension: Secondary | ICD-10-CM | POA: Diagnosis not present

## 2019-09-24 DIAGNOSIS — E663 Overweight: Secondary | ICD-10-CM | POA: Diagnosis not present

## 2019-09-24 DIAGNOSIS — K279 Peptic ulcer, site unspecified, unspecified as acute or chronic, without hemorrhage or perforation: Secondary | ICD-10-CM | POA: Diagnosis not present

## 2019-09-24 DIAGNOSIS — G43809 Other migraine, not intractable, without status migrainosus: Secondary | ICD-10-CM | POA: Diagnosis not present

## 2019-09-24 DIAGNOSIS — F5101 Primary insomnia: Secondary | ICD-10-CM

## 2019-09-24 DIAGNOSIS — F411 Generalized anxiety disorder: Secondary | ICD-10-CM | POA: Diagnosis not present

## 2019-09-24 DIAGNOSIS — E782 Mixed hyperlipidemia: Secondary | ICD-10-CM | POA: Diagnosis not present

## 2019-09-24 DIAGNOSIS — F39 Unspecified mood [affective] disorder: Secondary | ICD-10-CM | POA: Diagnosis not present

## 2019-09-24 DIAGNOSIS — F339 Major depressive disorder, recurrent, unspecified: Secondary | ICD-10-CM

## 2019-09-24 MED ORDER — QUETIAPINE FUMARATE 400 MG PO TABS: 800.0000 mg | ORAL_TABLET | Freq: Every day | ORAL | 1 refills | Status: DC

## 2019-09-24 MED ORDER — PANTOPRAZOLE SODIUM 40 MG PO TBEC: 40.0000 mg | DELAYED_RELEASE_TABLET | Freq: Every day | ORAL | 1 refills | Status: DC

## 2019-09-24 MED ORDER — BUPROPION HCL ER (XL) 300 MG PO TB24: 300.0000 mg | ORAL_TABLET | Freq: Every day | ORAL | 1 refills | Status: DC

## 2019-09-24 MED ORDER — ATORVASTATIN CALCIUM 20 MG PO TABS: 20.0000 mg | ORAL_TABLET | Freq: Every day | ORAL | 1 refills | Status: DC

## 2019-09-24 MED ORDER — SUMATRIPTAN SUCCINATE 50 MG PO TABS: 50.0000 mg | ORAL_TABLET | ORAL | 5 refills | Status: DC | PRN

## 2019-09-24 MED ORDER — ALPRAZOLAM 1 MG PO TABS: ORAL_TABLET | ORAL | 5 refills | Status: DC

## 2019-09-24 MED ORDER — TOPIRAMATE 100 MG PO TABS: 100.0000 mg | ORAL_TABLET | Freq: Two times a day (BID) | ORAL | 1 refills | Status: DC

## 2019-09-24 MED ORDER — DULOXETINE HCL 60 MG PO CPEP: 60.0000 mg | ORAL_CAPSULE | Freq: Every day | ORAL | 1 refills | Status: DC

## 2019-09-24 NOTE — Progress Notes (Signed)
Subjective:    Patient ID: [name], female    DOB: 1956/01/10, 64 y.o.   MRN: [id number]    Chief Complaint: Medical Management of Chronic Issues    HPI:  1. Essential hypertension No c/o chest pain, ob or headaches. Does not check blood pressure at home. BP Readings from Last 3 Encounters:  09/24/19 (!) 146/100  03/25/19 (!) 150/91  03/07/18 (!) 134/99     2. Other migraine without status migrainosus, not intractable Last migraine was about 3 weeks ago. He take an imitrex if she feel one coming on and that helps. She is on topamax for preventative. She says it hasnreally cut down the frequency of her headache.  3. Mixed hyperlipidemia Trying to avoid fried foods. Does no dedicated exercise. Lab Results  Component Value Date   CHOL 205 (H) 03/25/2019   HDL 70 03/25/2019   LDLCALC 113 (H) 03/25/2019   TRIG 125 03/25/2019   CHOLHDL 2.9 03/25/2019     4. Depression, recurrent ([place]) Is on cymbala,and wellbutrin  is doing well.  Depression screen [facility] 2/9 09/24/2019 03/25/2019 03/07/2018  Decreased Interest 0 1 2  Down, Depressed, Hopeless 0 1 2  PHQ - 2 Score 0 2 4  Altered sleeping - 3 1  Tired, decreased energy - 1 1  Change in appetite - 1 0  Feeling bad or failure about yourself  - 0 0  Trouble concentrating - 0 1  Moving slowly or fidgety/restless - 0 1  Suicidal thoughts - 0 0  PHQ-9 Score - 7 8  Difficult doing work/chores - Not difficult at all -  Some recent data might be hidden     5. Episodic mood disorder (HCC) Is on seroquel and says she has very few mood swings. She is not seeing pych.  6. GAD (generalized anxiety disorder) She Is on xanax and ha to take 3x a day. They were trying to ween her down. GAD 7 : Generalized Anxiety Score 09/24/2019 03/25/2019  Nervous, Anxious, on Edge 1 1  Control/stop worrying 3 1  Worry too much - different things 3 1  Trouble relaxing 0 1  Restless 0 0  Easily annoyed or irritable 1 0  Afraid - awful  might happen - 0  Total GAD 7 Score - 4  Anxiety Difficulty Not difficult at all Not difficult at all     7. Primary insomnia Is on ambien to sleep. She says that she takes her xanax and [place] together at bedtime  8. Overweight (BMI 25.0-29.9) Weight has gone up 13lb since last visit Wt Readings from Last 3 Encounters:  09/24/19 183 lb (83 kg)  03/25/19 170 lb (77.1 kg)  03/07/18 176 lb 3.2 oz (79.9 kg)    9. PUD (peptic ulcer disease) occaional abdominal pain depending on what she is eating.    Outpatient Encounter Medications as of 09/24/2019  Medication Sig  . acetaminophen (TYLENOL ARTHRITIS PAIN) 650 MG CR tablet Take 650 mg by mouth every 8 (eight) hours as needed for pain.  Marland Kitchen ALPRAZolam (XANAX) 1 MG tablet Take 1 tab QAM, 1/2 tab in the day and 1 tab QPM  . atorvastatin (LIPITOR) 20 MG tablet Take 1 tablet (20 mg total) by mouth daily.  Marland Kitchen buPROPion (WELLBUTRIN XL) 300 MG 24 hr tablet Take 1 tablet (300 mg total) by mouth daily.  . DULoxetine (CYMBALTA) 60 MG capsule Take 1-2 capsules (60-120 mg total) by mouth daily.  Marland Kitchen linaclotide (LINZESS) [place]  capsule Take 1 capsule (290 mcg total) by mouth daily as needed (For constipation.).  Marland Kitchen ondansetron (ZOFRAN) 4 MG tablet take 1  Tablet by mouth 3 times daily BEFORE meals  . pantoprazole (PROTONIX) 40 MG tablet Take 1 tablet (40 mg total) by mouth daily. Take 30 minutes before breakfast daily.  . QUEtiapine (SEROQUEL) 400 MG tablet Take 2 tablets (800 mg total) by mouth at bedtime.  . SUMAtriptan (IMITREX) 50 MG tablet Take 1 tablet (50 mg total) by mouth every 2 (two) hours as needed for migraine. May repeat in 2 hours if headache persists or recurs. Headache Treatment  . topiramate (TOPAMAX) 100 MG tablet Take 1 tablet (100 mg total) by mouth 2 (two) times daily. Headache prevention  . zolpidem (AMBIEN) 5 MG tablet Take 1 tablet (5 mg total) by mouth at bedtime as needed for sleep.     Past Surgical History:    Procedure Laterality Date  . APPENDECTOMY    . BACK SURGERY    . CARPAL TUNNEL RELEASE     BILATERAL  . COLONOSCOPY, ESOPHAGOGASTRODUODENOSCOPY (EGD) AND ESOPHAGEAL DILATION N/A 02/06/2013   RMR. Benign colon polyp, diverticulosis, hemorrhoids. EGD with Schatzki's ring s/p 58 F dilation, 36m elliptical prepyloric antral ulcer with adjacent scarring, negative path  . ESOPHAGEAL MANOMETRY N/A 09/04/2015   Procedure: ESOPHAGEAL MANOMETRY (EM);  Surgeon: VWilford Corner MD;  Location: [facility];  Service: Endoscopy;  Laterality: N/A;  . ESOPHAGOGASTRODUODENOSCOPY N/A 09/23/2013   Dr. RGala Romney non-critical Schatzki's ring, not manipulated, healed ulcer, gastric bx negative  . ESOPHAGOGASTRODUODENOSCOPY (EGD) WITH PROPOFOL N/A 08/10/2015   Procedure: ESOPHAGOGASTRODUODENOSCOPY (EGD) WITH PROPOFOL;  Surgeon: RDaneil Dolin MD;  [facility];  Service: Endoscopy;  Laterality: N/A;  0830  . INSERTION OF MESH N/A 10/02/2015   Procedure: INSERTION OF MESH;  Surgeon: ARalene Ok MD;  Location: [facility];  Service: General;  Laterality: N/A;  . LUMBAR FUSION  02/12/2014   LEVEL 1 L4 L5    DR BROOKS  . MALONEY DILATION N/A 08/10/2015   Procedure: MVenia MinksDILATION;  Surgeon: RDaneil Dolin MD;  [facility]: [facility];  Service: Endoscopy;  Laterality: N/A;  . PARTIAL KNEE ARTHROPLASTY Left 07/15/2013   Procedure: UNICOMPARTMENTAL LEFT KNEE MEDIAL ;  Surgeon: MMauri Pole MD;  Location: [facility];  Service: Orthopedics;  Laterality: Left;, Right uni knee done also.    Family History  Problem Relation Age of Onset  . Heart attack Mother   . Colon cancer Neg Hx     New complaints: None today  Social history: Lives by [employer] Her on lives next door to her.  Controlled substance contract: 04/03/19  Review of Systems  Constitutional: Negative for diaphoresis.  Eyes: Negative for pain.  Respiratory: Negative for shortness of breath.   Cardiovascular: Negative for chest pain,  palpitations and leg swelling.  Gastrointestinal: Negative for abdominal pain.  Endocrine: Negative for polydipsia.  Skin: Negative for rash.  Neurological: Negative for dizziness, weakness and headaches.  Hematological: Does not bruise/bleed easily.  All other systems reviewed and are negative.      Objective:   Physical Exam Vitals and nursing note reviewed.  Constitutional:      General: She is not in acute distress.    Appearance: Normal appearance. She is well-developed.  HENT:     Head: Normocephalic.     Nose: Nose normal.  Eyes:     Pupils: Pupils are equal, round, and reactive to light.  Neck:  Vascular: No carotid bruit or JVD.  Cardiovascular:     Rate and Rhythm: Normal rate and regular rhythm.     Heart sounds: Normal heart sounds.  Pulmonary:     Effort: Pulmonary effort is normal. No respiratory distress.     Breath sounds: Normal breath sounds. No wheezing or rales.  Chest:     Chest wall: No tenderness.  Abdominal:     General: Bowel sounds are normal. There is no distension or abdominal bruit.     Palpations: Abdomen is soft. There is no hepatomegaly, splenomegaly, mass or pulsatile mass.     Tenderness: There is no abdominal tenderness.  Musculoskeletal:        General: Normal range of motion.     Cervical back: Normal range of motion and neck supple.     Right lower leg: Edema (1+) present.     Left lower leg: Edema (1+) present.  Lymphadenopathy:     Cervical: No cervical adenopathy.  Skin:    General: Skin is warm and dry.  Neurological:     Mental Status: She is alert and oriented to person, place, and time.     Deep Tendon Reflexes: Reflexes are normal and symmetric.  Psychiatric:        Behavior: Behavior normal.        Thought Content: Thought content normal.        Judgment: Judgment normal.     BP 138/86 (BP Location: Left Arm, Cuff Size: Normal)   Pulse (!) 117   Temp (!) 97.1 F (36.2 C) (Temporal)   Resp 20   Ht '5\' 2"'$  (1.575  m)   Wt 183 lb (83 kg)   SpO2 96%   BMI 33.47 kg/m '        Assessment & Plan:  [name] comes in today with chief complaint of Medical Management of Chronic Issues   Diagnosis and orders addressed:  1. Essential hypertension Low odium diet - CBC with Differential/Platelet - CMP14+EGFR  2. Other migraine without status migrainosus, not intractable Avoid caffeine - SUMAtriptan (IMITREX) 50 MG tablet; Take 1 tablet (50 mg total) by mouth every 2 (two) hours as needed for migraine. May repeat in 2 hours if headache persists or recurs. Headache Treatment  Dispense: 10 tablet; Refill: 5 - topiramate (TOPAMAX) 100 MG tablet; Take 1 tablet (100 mg total) by mouth 2 (two) times daily. Headache prevention  Dispense: 180 tablet; Refill: 1  3. Mixed hyperlipidemia Low fat diet - atorvastatin (LIPITOR) 20 MG tablet; Take 1 tablet (20 mg total) by mouth daily.  Dispense: 90 tablet; Refill: 1 - Lipid panel  4. Depression, recurrent ([place]) Stress management  5. Episodic mood disorder (HCC) - QUEtiapine (SEROQUEL) 400 MG tablet; Take 2 tablets (800 mg total) by mouth at bedtime.  Dispense: 180 tablet; Refill: 1 - DULoxetine (CYMBALTA) 60 MG capsule; Take 1-2 capsules (60-120 mg total) by mouth daily.  Dispense: 180 capsule; Refill: 1 - buPROPion (WELLBUTRIN XL) 300 MG 24 hr tablet; Take 1 tablet (300 mg total) by mouth daily.  Dispense: 90 tablet; Refill: 1  6. GAD (generalized anxiety disorder) Must back 75 xanax last her a month - ALPRAZolam (XANAX) 1 MG tablet; Take 1 tab QAM, 1/2 tab in the day and 1 tab QPM  Dispense: 75 tablet; Refill: 5  7. Primary insomnia Bedtime routinestopped ambien  8. Overweight (BMI 25.0-29.9) Discussed diet and exercise for person with BMI >25 Will recheck weight in 3-6 months  9.  PUD (peptic ulcer disease) Avoid spicy foods Do not eat 2 hours prior to bedtime  - pantoprazole (PROTONIX) 40 MG tablet; Take 1 tablet (40 mg total) by mouth  daily. Take 30 minutes before breakfast daily.  Dispense: 90 tablet; Refill: 1   Labs pending Health Maintenance reviewed Diet and exercise encouraged  Follow up plan: 6 months   Mary-Margaret Hassell Done, FNP

## 2019-09-24 NOTE — Patient Instructions (Signed)

## 2019-09-25 LAB — CBC WITH DIFFERENTIAL/PLATELET
Basophils Absolute: 0.1 10*3/uL (ref 0.0–0.2)
Basos: 1 %
EOS (ABSOLUTE): 0.1 10*3/uL (ref 0.0–0.4)
Eos: 2 %
Hematocrit: 38.9 % (ref 34.0–46.6)
Hemoglobin: 12.8 g/dL (ref 11.1–15.9)
Immature Grans (Abs): 0 10*3/uL (ref 0.0–0.1)
Immature Granulocytes: 0 %
Lymphocytes Absolute: 1.5 10*3/uL (ref 0.7–3.1)
Lymphs: 26 %
MCH: 29.5 pg (ref 26.6–33.0)
MCHC: 32.9 g/dL (ref 31.5–35.7)
MCV: 90 fL (ref 79–97)
Monocytes Absolute: 0.5 10*3/uL (ref 0.1–0.9)
Monocytes: 9 %
Neutrophils Absolute: 3.6 10*3/uL (ref 1.4–7.0)
Neutrophils: 62 %
Platelets: 221 10*3/uL (ref 150–450)
RBC: 4.34 x10E6/uL (ref 3.77–5.28)
RDW: 13.8 % (ref 11.7–15.4)
WBC: 5.8 10*3/uL (ref 3.4–10.8)

## 2019-09-25 LAB — CMP14+EGFR
ALT: 17 IU/L (ref 0–32)
AST: 20 IU/L (ref 0–40)
Albumin/Globulin Ratio: 1.5 (ref 1.2–2.2)
Albumin: 4.4 g/dL (ref 3.8–4.8)
Alkaline Phosphatase: 139 IU/L — ABNORMAL HIGH (ref 48–121)
BUN/Creatinine Ratio: 14 (ref 12–28)
BUN: 17 mg/dL (ref 8–27)
Bilirubin Total: <0.2 mg/dL (ref 0.0–1.2)
CO2: 21 mmol/L (ref 20–29)
Calcium: 9.4 mg/dL (ref 8.7–10.3)
Chloride: 103 mmol/L (ref 96–106)
Creatinine, Ser: 1.21 mg/dL — ABNORMAL HIGH (ref 0.57–1.00)
GFR calc Af Amer: 55 mL/min/{1.73_m2} — ABNORMAL LOW (ref >59)
GFR calc non Af Amer: 48 mL/min/{1.73_m2} — ABNORMAL LOW (ref >59)
Globulin, Total: 2.9 g/dL (ref 1.5–4.5)
Glucose: 97 mg/dL (ref 65–99)
Potassium: 4.2 mmol/L (ref 3.5–5.2)
Sodium: 140 mmol/L (ref 134–144)
Total Protein: 7.3 g/dL (ref 6.0–8.5)

## 2019-09-25 LAB — LIPID PANEL
Chol/HDL Ratio: 4.9 ratio — ABNORMAL HIGH (ref 0.0–4.4)
Cholesterol, Total: 288 mg/dL — ABNORMAL HIGH (ref 100–199)
HDL: 59 mg/dL (ref >39)
LDL Chol Calc (NIH): 182 mg/dL — ABNORMAL HIGH (ref 0–99)
Triglycerides: 245 mg/dL — ABNORMAL HIGH (ref 0–149)
VLDL Cholesterol Cal: 47 mg/dL — ABNORMAL HIGH (ref 5–40)

## 2020-02-14 ENCOUNTER — Other Ambulatory Visit: Payer: Self-pay | Admitting: Nurse Practitioner

## 2020-02-14 DIAGNOSIS — G43809 Other migraine, not intractable, without status migrainosus: Secondary | ICD-10-CM

## 2020-02-14 DIAGNOSIS — F39 Unspecified mood [affective] disorder: Secondary | ICD-10-CM

## 2020-03-26 ENCOUNTER — Encounter: Payer: Self-pay | Admitting: Nurse Practitioner

## 2020-03-26 ENCOUNTER — Ambulatory Visit (INDEPENDENT_AMBULATORY_CARE_PROVIDER_SITE_OTHER): Payer: Medicaid Other | Admitting: Nurse Practitioner

## 2020-03-26 ENCOUNTER — Other Ambulatory Visit: Payer: Self-pay

## 2020-03-26 VITALS — BP 130/87 | HR 104 | Temp 97.1°F | Resp 20 | Ht 62.0 in | Wt 178.0 lb

## 2020-03-26 DIAGNOSIS — F411 Generalized anxiety disorder: Secondary | ICD-10-CM | POA: Diagnosis not present

## 2020-03-26 DIAGNOSIS — E663 Overweight: Secondary | ICD-10-CM

## 2020-03-26 DIAGNOSIS — F39 Unspecified mood [affective] disorder: Secondary | ICD-10-CM

## 2020-03-26 DIAGNOSIS — G43809 Other migraine, not intractable, without status migrainosus: Secondary | ICD-10-CM

## 2020-03-26 DIAGNOSIS — I10 Essential (primary) hypertension: Secondary | ICD-10-CM | POA: Diagnosis not present

## 2020-03-26 DIAGNOSIS — E782 Mixed hyperlipidemia: Secondary | ICD-10-CM

## 2020-03-26 DIAGNOSIS — K279 Peptic ulcer, site unspecified, unspecified as acute or chronic, without hemorrhage or perforation: Secondary | ICD-10-CM | POA: Diagnosis not present

## 2020-03-26 MED ORDER — SUMATRIPTAN SUCCINATE 50 MG PO TABS: 50.0000 mg | ORAL_TABLET | ORAL | 5 refills | Status: DC | PRN

## 2020-03-26 MED ORDER — ALPRAZOLAM 1 MG PO TABS: ORAL_TABLET | ORAL | 5 refills | Status: DC

## 2020-03-26 MED ORDER — BUPROPION HCL ER (XL) 300 MG PO TB24: 300.0000 mg | ORAL_TABLET | Freq: Every day | ORAL | 1 refills | Status: DC

## 2020-03-26 MED ORDER — LINACLOTIDE 290 MCG PO CAPS: 290.0000 ug | ORAL_CAPSULE | Freq: Every day | ORAL | 11 refills | Status: AC | PRN

## 2020-03-26 MED ORDER — QUETIAPINE FUMARATE 400 MG PO TABS: 800.0000 mg | ORAL_TABLET | Freq: Every day | ORAL | 1 refills | Status: DC

## 2020-03-26 MED ORDER — DULOXETINE HCL 60 MG PO CPEP: 60.0000 mg | ORAL_CAPSULE | Freq: Every day | ORAL | 1 refills | Status: DC

## 2020-03-26 MED ORDER — ATORVASTATIN CALCIUM 20 MG PO TABS: 20.0000 mg | ORAL_TABLET | Freq: Every day | ORAL | 1 refills | Status: DC

## 2020-03-26 MED ORDER — TOPIRAMATE 100 MG PO TABS: 100.0000 mg | ORAL_TABLET | Freq: Two times a day (BID) | ORAL | 1 refills | Status: DC

## 2020-03-26 MED ORDER — PANTOPRAZOLE SODIUM 40 MG PO TBEC: 40.0000 mg | DELAYED_RELEASE_TABLET | Freq: Every day | ORAL | 1 refills | Status: DC

## 2020-03-26 NOTE — Progress Notes (Signed)
Subjective:    Patient ID: [name], female    DOB: March 06, 1956, 64 y.o.   MRN: [id number]   Chief Complaint: Medical Management of Chronic Issues    HPI:  1. Essential hypertension BP Readings from Last 3 Encounters:  03/26/20 130/87  09/24/19 138/86  03/25/19 (!) 150/91   Takes medication as prescribed, does not check BP at home. Watches salt intake. Denies chest pain, SOB, or weakness.   2. PUD (peptic ulcer disease) Takes medication as prescribed. Denies any concerns at this time.   3. Overweight (BMI 25.0-29.9) Wt Readings from Last 3 Encounters:  03/26/20 178 lb (80.7 kg)  09/24/19 183 lb (83 kg)  03/25/19 170 lb (77.1 kg)   Walks and rides horses for exercise.   4. Mixed hyperlipidemia Lab Results  Component Value Date   CHOL 288 (H) 09/24/2019   HDL 59 09/24/2019   LDLCALC 182 (H) 09/24/2019   TRIG 245 (H) 09/24/2019   CHOLHDL 4.9 (H) 09/24/2019  Takes medication as prescribed. Eats a lot of fried and fast foods.    5. GAD (generalized anxiety disorder) GAD 7 : Generalized Anxiety Score 03/26/2020 09/24/2019 03/25/2019  Nervous, Anxious, on Edge 0 1 1  Control/stop worrying 3 3 1   Worry too much - different things 3 3 1   Trouble relaxing 0 0 1  Restless 0 0 0  Easily annoyed or irritable 1 1 0  Afraid - awful might happen 0 - 0  Total GAD 7 Score 7 - 4  Anxiety Difficulty Not difficult at all Not difficult at all Not difficult at all   States anxiety is under control at this time. Takes 1 Xanax in the AM, 1 in the afternoon, & 2 at night. States she sleeps well.      Outpatient Encounter Medications as of 03/26/2020  Medication Sig  . QUEtiapine (SEROQUEL) 400 MG tablet TAKE 2 TABLETS (800 MG TOTAL) BY MOUTH AT BEDTIME.  Marland Kitchen acetaminophen (TYLENOL ARTHRITIS PAIN) 650 MG CR tablet Take 650 mg by mouth every 8 (eight) hours as needed for pain.  Marland Kitchen ALPRAZolam (XANAX) 1 MG tablet Take 1 tab QAM, 1/2 tab in the day and 1 tab QPM  . atorvastatin  (LIPITOR) 20 MG tablet Take 1 tablet (20 mg total) by mouth daily.  Marland Kitchen buPROPion (WELLBUTRIN XL) 300 MG 24 hr tablet Take 1 tablet (300 mg total) by mouth daily.  . DULoxetine (CYMBALTA) 60 MG capsule Take 1-2 capsules (60-120 mg total) by mouth daily.  Marland Kitchen linaclotide (LINZESS) 290 MCG CAPS capsule Take 1 capsule (290 mcg total) by mouth daily as needed (For constipation.).  Marland Kitchen ondansetron (ZOFRAN) 4 MG tablet take 1  Tablet by mouth 3 times daily BEFORE meals  . pantoprazole (PROTONIX) 40 MG tablet Take 1 tablet (40 mg total) by mouth daily. Take 30 minutes before breakfast daily.  . SUMAtriptan (IMITREX) 50 MG tablet Take 1 tablet (50 mg total) by mouth every 2 (two) hours as needed for migraine. May repeat in 2 hours if headache persists or recurs. Headache Treatment  . topiramate (TOPAMAX) 100 MG tablet TAKE 1 TABLET (100 MG TOTAL) BY MOUTH 2 (TWO) TIMES DAILY. HEADACHE PREVENTION   No facility-administered encounter medications on file as of 03/26/2020.    Past Surgical History:  Procedure Laterality Date  . APPENDECTOMY    . BACK SURGERY    . CARPAL TUNNEL RELEASE     BILATERAL  . COLONOSCOPY, ESOPHAGOGASTRODUODENOSCOPY (EGD) AND ESOPHAGEAL DILATION N/A 02/06/2013  RMR. Benign colon polyp, diverticulosis, hemorrhoids. EGD with Schatzki's ring s/p 35 F dilation, 79mm elliptical prepyloric antral ulcer with adjacent scarring, negative path  . ESOPHAGEAL MANOMETRY N/A 09/04/2015   Procedure: ESOPHAGEAL MANOMETRY (EM);  Surgeon: Wilford Corner, MD;  Location: [facility];  Service: Endoscopy;  Laterality: N/A;  . ESOPHAGOGASTRODUODENOSCOPY N/A 09/23/2013   Dr. Gala Romney: non-critical Schatzki's ring, not manipulated, healed ulcer, gastric bx negative  . ESOPHAGOGASTRODUODENOSCOPY (EGD) WITH PROPOFOL N/A 08/10/2015   Procedure: ESOPHAGOGASTRODUODENOSCOPY (EGD) WITH PROPOFOL;  Surgeon: Daneil Dolin, MD;  [facility];  Service: Endoscopy;  Laterality: N/A;  0830  . INSERTION OF MESH N/A  10/02/2015   Procedure: INSERTION OF MESH;  Surgeon: [name], MD;  Location: [facility];  Service: General;  Laterality: N/A;  . LUMBAR FUSION  02/12/2014   LEVEL 1 L4 L5    DR BROOKS  . MALONEY DILATION N/A 08/10/2015   Procedure: Venia Minks DILATION;  Surgeon: Daneil Dolin, MD;  [facility];  Service: Endoscopy;  Laterality: N/A;  . PARTIAL KNEE ARTHROPLASTY Left 07/15/2013   Procedure: UNICOMPARTMENTAL LEFT KNEE MEDIAL ;  Surgeon: [name], MD;  Location: [facility];  Service: Orthopedics;  Laterality: Left;, Right uni knee done also.    Family History  Problem Relation Age of Onset  . Heart attack Mother   . Colon cancer Neg Hx     New complaints: No new complaints today.   Social history: Lives at home alone, lives next door to son. Rides horses for fun.   Controlled substance contract: signed 03/26/2020     Review of Systems  Constitutional: Negative.   HENT: Negative.   Eyes: Negative.   Respiratory: Negative.   Cardiovascular: Negative.   Gastrointestinal: Negative.   Endocrine: Negative.   Genitourinary: Negative.   Musculoskeletal: Negative.   Skin: Negative.   Allergic/Immunologic: Negative.   Neurological: Negative.   Hematological: Negative.   Psychiatric/Behavioral: Negative.   All other systems reviewed and are negative.      Objective:   Physical Exam Vitals and nursing note reviewed.  Constitutional:      Appearance: Normal appearance.  HENT:     Head: Normocephalic and atraumatic.     Right Ear: Tympanic membrane, ear canal and external ear normal.     Left Ear: Tympanic membrane, ear canal and external ear normal.     Nose: Nose normal.     Mouth/Throat:     Mouth: Mucous membranes are moist.     Pharynx: Oropharynx is clear.  Eyes:     Extraocular Movements: Extraocular movements intact.     Conjunctiva/sclera: Conjunctivae normal.     Pupils: Pupils are equal, round, and reactive to light.  Cardiovascular:     Rate and  Rhythm: Normal rate and regular rhythm.     Pulses: Normal pulses.     Heart sounds: Normal heart sounds.  Pulmonary:     Effort: Pulmonary effort is normal.     Breath sounds: Examination of the right-lower field reveals decreased breath sounds. Decreased breath sounds present.  Abdominal:     General: Abdomen is flat. Bowel sounds are normal.     Palpations: Abdomen is soft.  Musculoskeletal:        General: Normal range of motion.     Cervical back: Normal range of motion.  Skin:    General: Skin is warm and dry.     Capillary Refill: Capillary refill takes less than 2 seconds.  Neurological:  General: No focal deficit present.     Mental Status: She is alert and oriented to person, place, and time. Mental status is at baseline.  Psychiatric:        Mood and Affect: Mood normal.        Behavior: Behavior normal.        Thought Content: Thought content normal.        Judgment: Judgment normal.    BP 130/87   Pulse (!) 104   Temp (!) 97.1 F (36.2 C) (Temporal)   Resp 20   Ht 5\' 2"  (1.575 m)   Wt 178 lb (80.7 kg)   SpO2 95%   BMI 32.56 kg/m       Assessment & Plan:  RAYNEE MCCASLAND comes in today with chief complaint of Medical Management of Chronic Issues   Diagnosis and orders addressed:  1. Essential hypertension Take medication as prescribed and check blood pressure regularly. Eat a heart healthy diet and avoid foods that are high in salt.   2. PUD (peptic ulcer disease) Take medication as prescribed. Avoid trigger foods such as spicy or fried foods. Eat smaller meals more frequently verses large meals.   3. Overweight (BMI 25.0-29.9) Exercise regularly. Regular exercise helps lower blood pressure, maintain a healthy weight, and help improve mood.   4. Mixed hyperlipidemia Take medication as prescribed. Avoid fried and fatty foods.   5. GAD (generalized anxiety disorder) Take medication as prescribed. Practice stress management.  Discussed the  importance of taking meds as rx. Do  not take 2 xanax at bedtime. Told to take 1 in morning, 1/2 at lunch and 1 in evening. Would like to wean her down to 2 a day at next visit.  Labs pending Health Maintenance reviewed Diet and exercise encouraged  Follow up plan: Follow up in 6 months.    Mary-Margaret Hassell Done, FNP

## 2020-03-26 NOTE — Patient Instructions (Signed)
Stress, Adult Stress is a normal reaction to life events. Stress is what you feel when life demands more than you are used to, or more than you think you can handle. Some stress can be useful, such as studying for a test or meeting a deadline at work. Stress that occurs too often or for too long can cause problems. It can affect your emotional health and interfere with relationships and normal daily activities. Too much stress can weaken your body's defense system (immune system) and increase your risk for physical illness. If you already have a medical problem, stress can make it worse. What are the causes? All sorts of life events can cause stress. An event that causes stress for one person may not be stressful for another person. Major life events, whether positive or negative, commonly cause stress. Examples include:  Losing a job or starting a new job.  Losing a loved one.  Moving to a new town or home.  Getting married or divorced.  Having a baby.  Getting injured or sick. Less obvious life events can also cause stress, especially if they occur day after day or in combination with each other. Examples include:  Working long hours.  Driving in traffic.  Caring for children.  Being in debt.  Being in a difficult relationship. What are the signs or symptoms? Stress can cause emotional symptoms, including:  Anxiety. This is feeling worried, afraid, on edge, overwhelmed, or out of control.  Anger, including irritation or impatience.  Depression. This is feeling sad, down, helpless, or guilty.  Trouble focusing, remembering, or making decisions. Stress can cause physical symptoms, including:  Aches and pains. These may affect your head, neck, back, stomach, or other areas of your body.  Tight muscles or a clenched jaw.  Low energy.  Trouble sleeping. Stress can cause unhealthy behaviors, including:  Eating to feel better (overeating) or skipping meals.  Working too  much or putting off tasks.  Smoking, drinking alcohol, or using drugs to feel better. How is this diagnosed? Stress is diagnosed through an assessment by your health care provider. He or she may diagnose this condition based on:  Your symptoms and any stressful life events.  Your medical history.  Tests to rule out other causes of your symptoms. Depending on your condition, your health care provider may refer you to a specialist for further evaluation. How is this treated?  Stress management techniques are the recommended treatment for stress. Medicine is not typically recommended for the treatment of stress. Techniques to reduce your reaction to stressful life events include:  Stress identification. Monitor yourself for symptoms of stress and identify what causes stress for you. These skills may help you to avoid or prepare for stressful events.  Time management. Set your priorities, keep a calendar of events, and learn to say no. Taking these actions can help you avoid making too many commitments. Techniques for coping with stress include:  Rethinking the problem. Try to think realistically about stressful events rather than ignoring them or overreacting. Try to find the positives in a stressful situation rather than focusing on the negatives.  Exercise. Physical exercise can release both physical and emotional tension. The key is to find a form of exercise that you enjoy and do it regularly.  Relaxation techniques. These relax the body and mind. The key is to find one or more that you enjoy and use the techniques regularly. Examples include: ? Meditation, deep breathing, or progressive relaxation techniques. ? Yoga or   tai chi. ? Biofeedback, mindfulness techniques, or journaling. ? Listening to music, being out in nature, or participating in other hobbies.  Practicing a healthy lifestyle. Eat a balanced diet, drink plenty of water, limit or avoid caffeine, and get plenty of  sleep.  Having a strong support network. Spend time with family, friends, or other people you enjoy being around. Express your feelings and talk things over with someone you trust. Counseling or talk therapy with a mental health professional may be helpful if you are having trouble managing stress on your own. Follow these instructions at home: Lifestyle   Avoid drugs.  Do not use any products that contain nicotine or tobacco, such as cigarettes, e-cigarettes, and chewing tobacco. If you need help quitting, ask your health care provider.  Limit alcohol intake to no more than 1 drink a day for nonpregnant women and 2 drinks a day for men. One drink equals 12 oz of beer, 5 oz of wine, or 1 oz of hard liquor  Do not use alcohol or drugs to relax.  Eat a balanced diet that includes fresh fruits and vegetables, whole grains, lean meats, fish, eggs, and beans, and low-fat dairy. Avoid processed foods and foods high in added fat, sugar, and salt.  Exercise at least 30 minutes on 5 or more days each week.  Get 7-8 hours of sleep each night. General instructions   Practice stress management techniques as discussed with your health care provider.  Drink enough fluid to keep your urine clear or pale yellow.  Take over-the-counter and prescription medicines only as told by your health care provider.  Keep all follow-up visits as told by your health care provider. This is important. Contact a health care provider if:  Your symptoms get worse.  You have new symptoms.  You feel overwhelmed by your problems and can no longer manage them on your own. Get help right away if:  You have thoughts of hurting yourself or others. If you ever feel like you may hurt yourself or others, or have thoughts about taking your own life, get help right away. You can go to your nearest emergency department or call:  Your local emergency services (911 in the [place]  A suicide crisis helpline, such as the  [place] at (316) 250-6172. This is open 24 hours a day. Summary  Stress is a normal reaction to life events. It can cause problems if it happens too often or for too long.  Practicing stress management techniques is the best way to treat stress.  Counseling or talk therapy with a mental health professional may be helpful if you are having trouble managing stress on your own. This information is not intended to replace advice given to you by your health care provider. Make sure you discuss any questions you have with your health care provider. Document Revised: 11/23/2018 Document Reviewed: 06/15/2016 Elsevier Patient Education  [place].

## 2020-03-27 LAB — CBC WITH DIFFERENTIAL/PLATELET
Basophils Absolute: 0.1 10*3/uL (ref 0.0–0.2)
Basos: 1 %
EOS (ABSOLUTE): 0.2 10*3/uL (ref 0.0–0.4)
Eos: 3 %
Hematocrit: 37.4 % (ref 34.0–46.6)
Hemoglobin: 12.6 g/dL (ref 11.1–15.9)
Immature Grans (Abs): 0 10*3/uL (ref 0.0–0.1)
Immature Granulocytes: 0 %
Lymphocytes Absolute: 1.7 10*3/uL (ref 0.7–3.1)
Lymphs: 29 %
MCH: 30.5 pg (ref 26.6–33.0)
MCHC: 33.7 g/dL (ref 31.5–35.7)
MCV: 91 fL (ref 79–97)
Monocytes Absolute: 0.6 10*3/uL (ref 0.1–0.9)
Monocytes: 10 %
Neutrophils Absolute: 3.2 10*3/uL (ref 1.4–7.0)
Neutrophils: 57 %
Platelets: 202 10*3/uL (ref 150–450)
RBC: 4.13 x10E6/uL (ref 3.77–5.28)
RDW: 14.2 % (ref 11.7–15.4)
WBC: 5.8 10*3/uL (ref 3.4–10.8)

## 2020-03-27 LAB — CMP14+EGFR
ALT: 16 IU/L (ref 0–32)
AST: 17 IU/L (ref 0–40)
Albumin/Globulin Ratio: 1.6 (ref 1.2–2.2)
Albumin: 4.4 g/dL (ref 3.8–4.8)
Alkaline Phosphatase: 108 IU/L (ref 44–121)
BUN/Creatinine Ratio: 14 (ref 12–28)
BUN: 16 mg/dL (ref 8–27)
Bilirubin Total: <0.2 mg/dL (ref 0.0–1.2)
CO2: 26 mmol/L (ref 20–29)
Calcium: 9.8 mg/dL (ref 8.7–10.3)
Chloride: 104 mmol/L (ref 96–106)
Creatinine, Ser: 1.15 mg/dL — ABNORMAL HIGH (ref 0.57–1.00)
GFR calc Af Amer: 58 mL/min/{1.73_m2} — ABNORMAL LOW (ref >59)
GFR calc non Af Amer: 50 mL/min/{1.73_m2} — ABNORMAL LOW (ref >59)
Globulin, Total: 2.8 g/dL (ref 1.5–4.5)
Glucose: 85 mg/dL (ref 65–99)
Potassium: 4.4 mmol/L (ref 3.5–5.2)
Sodium: 144 mmol/L (ref 134–144)
Total Protein: 7.2 g/dL (ref 6.0–8.5)

## 2020-03-27 LAB — LIPID PANEL
Chol/HDL Ratio: 5.4 ratio — ABNORMAL HIGH (ref 0.0–4.4)
Cholesterol, Total: 328 mg/dL — ABNORMAL HIGH (ref 100–199)
HDL: 61 mg/dL (ref >39)
LDL Chol Calc (NIH): 208 mg/dL — ABNORMAL HIGH (ref 0–99)
Triglycerides: 296 mg/dL — ABNORMAL HIGH (ref 0–149)
VLDL Cholesterol Cal: 59 mg/dL — ABNORMAL HIGH (ref 5–40)

## 2020-03-30 ENCOUNTER — Other Ambulatory Visit: Payer: Self-pay | Admitting: Nurse Practitioner

## 2020-03-30 DIAGNOSIS — Z1231 Encounter for screening mammogram for malignant neoplasm of breast: Secondary | ICD-10-CM

## 2020-03-31 MED ORDER — ATORVASTATIN CALCIUM 80 MG PO TABS: 80.0000 mg | ORAL_TABLET | Freq: Every day | ORAL | 3 refills | Status: DC

## 2020-03-31 NOTE — Addendum Note (Signed)
Addended by: Chevis Pretty on: 03/31/2020 08:03 AM   Modules accepted: Orders

## 2020-09-09 ENCOUNTER — Other Ambulatory Visit: Payer: Self-pay | Admitting: Nurse Practitioner

## 2020-09-09 DIAGNOSIS — F39 Unspecified mood [affective] disorder: Secondary | ICD-10-CM

## 2020-09-23 ENCOUNTER — Ambulatory Visit: Payer: Self-pay | Admitting: Nurse Practitioner

## 2020-09-25 ENCOUNTER — Encounter: Payer: Self-pay | Admitting: Nurse Practitioner

## 2020-10-09 ENCOUNTER — Other Ambulatory Visit: Payer: Self-pay | Admitting: Nurse Practitioner

## 2020-10-09 DIAGNOSIS — F411 Generalized anxiety disorder: Secondary | ICD-10-CM

## 2020-10-16 ENCOUNTER — Encounter: Payer: Self-pay | Admitting: Nurse Practitioner

## 2020-10-16 ENCOUNTER — Ambulatory Visit: Payer: Medicaid Other | Admitting: Nurse Practitioner

## 2020-10-16 ENCOUNTER — Other Ambulatory Visit (HOSPITAL_COMMUNITY)
Admission: RE | Admit: 2020-10-16 | Discharge: 2020-10-16 | Disposition: A | Discharge status: Home / Self Care | Payer: Medicaid Other | Source: Ambulatory Visit | Attending: Nurse Practitioner | Admitting: Nurse Practitioner

## 2020-10-16 ENCOUNTER — Other Ambulatory Visit: Payer: Self-pay

## 2020-10-16 ENCOUNTER — Ambulatory Visit (INDEPENDENT_AMBULATORY_CARE_PROVIDER_SITE_OTHER): Payer: Medicaid Other

## 2020-10-16 VITALS — BP 105/78 | HR 111 | Temp 97.6°F | Resp 20 | Ht 62.0 in | Wt 176.0 lb

## 2020-10-16 DIAGNOSIS — K279 Peptic ulcer, site unspecified, unspecified as acute or chronic, without hemorrhage or perforation: Secondary | ICD-10-CM

## 2020-10-16 DIAGNOSIS — R0602 Shortness of breath: Secondary | ICD-10-CM | POA: Diagnosis not present

## 2020-10-16 DIAGNOSIS — E782 Mixed hyperlipidemia: Secondary | ICD-10-CM | POA: Diagnosis not present

## 2020-10-16 DIAGNOSIS — Z0000 Encounter for general adult medical examination without abnormal findings: Secondary | ICD-10-CM

## 2020-10-16 DIAGNOSIS — K449 Diaphragmatic hernia without obstruction or gangrene: Secondary | ICD-10-CM | POA: Diagnosis not present

## 2020-10-16 DIAGNOSIS — F39 Unspecified mood [affective] disorder: Secondary | ICD-10-CM

## 2020-10-16 DIAGNOSIS — F411 Generalized anxiety disorder: Secondary | ICD-10-CM | POA: Diagnosis not present

## 2020-10-16 DIAGNOSIS — F339 Major depressive disorder, recurrent, unspecified: Secondary | ICD-10-CM | POA: Diagnosis not present

## 2020-10-16 DIAGNOSIS — Z6832 Body mass index (BMI) 32.0-32.9, adult: Secondary | ICD-10-CM

## 2020-10-16 DIAGNOSIS — G43809 Other migraine, not intractable, without status migrainosus: Secondary | ICD-10-CM | POA: Diagnosis not present

## 2020-10-16 DIAGNOSIS — R0600 Dyspnea, unspecified: Secondary | ICD-10-CM | POA: Diagnosis not present

## 2020-10-16 DIAGNOSIS — I10 Essential (primary) hypertension: Secondary | ICD-10-CM | POA: Diagnosis not present

## 2020-10-16 DIAGNOSIS — Z0001 Encounter for general adult medical examination with abnormal findings: Secondary | ICD-10-CM

## 2020-10-16 DIAGNOSIS — R0609 Other forms of dyspnea: Secondary | ICD-10-CM

## 2020-10-16 MED ORDER — SUMATRIPTAN SUCCINATE 50 MG PO TABS: 50.0000 mg | ORAL_TABLET | ORAL | 5 refills | Status: AC | PRN

## 2020-10-16 MED ORDER — ATORVASTATIN CALCIUM 80 MG PO TABS: 80.0000 mg | ORAL_TABLET | Freq: Every day | ORAL | 3 refills | Status: DC

## 2020-10-16 MED ORDER — BUPROPION HCL ER (XL) 300 MG PO TB24: 300.0000 mg | ORAL_TABLET | Freq: Every day | ORAL | 1 refills | Status: DC

## 2020-10-16 MED ORDER — PANTOPRAZOLE SODIUM 40 MG PO TBEC
40.0000 mg | DELAYED_RELEASE_TABLET | Freq: Every day | ORAL | 1 refills | Status: DC
Start: 2020-10-16 — End: 2021-02-15

## 2020-10-16 MED ORDER — ALPRAZOLAM 1 MG PO TABS: ORAL_TABLET | ORAL | 5 refills | Status: DC

## 2020-10-16 MED ORDER — DULOXETINE HCL 60 MG PO CPEP: 60.0000 mg | ORAL_CAPSULE | Freq: Every day | ORAL | 1 refills | Status: DC

## 2020-10-16 MED ORDER — TOPIRAMATE 100 MG PO TABS: 100.0000 mg | ORAL_TABLET | Freq: Two times a day (BID) | ORAL | 1 refills | Status: AC

## 2020-10-16 MED ORDER — FUROSEMIDE 20 MG PO TABS: 20.0000 mg | ORAL_TABLET | Freq: Every day | ORAL | 3 refills | Status: DC

## 2020-10-16 NOTE — Progress Notes (Signed)
Subjective:    Patient ID: [name], female    DOB: August 01, 1955, 65 y.o.   MRN: [id number]   Chief Complaint: Annual Exam    HPI:  1. Essential hypertension No c/o chest pain,  or headache. Does not check blood pressure at home. Has been having SOB for the last several months. BP Readings from Last 3 Encounters:  03/26/20 130/87  09/24/19 138/86  03/25/19 (!) 150/91     2. Other migraine without status migrainosus, not intractable Is on topamax for prevention of migraines. Has 2 a month. Imitrex works well to get rid of headache.  3. Mixed hyperlipidemia Does not watch diet and does no exercise. She does take her lipitor daily Lab Results  Component Value Date   CHOL 328 (H) 03/26/2020   HDL 61 03/26/2020   LDLCALC 208 (H) 03/26/2020   TRIG 296 (H) 03/26/2020   CHOLHDL 5.4 (H) 03/26/2020     4. Depression, recurrent ([place]) Is on seroquel, wellbutrin and cymbalta. Combination is working well for her. Depression screen [facility] 2/9 10/16/2020 03/26/2020 09/24/2019  Decreased Interest 0 1 0  Down, Depressed, Hopeless 1 1 0  PHQ - 2 Score 1 2 0  Altered sleeping 2 0 -  Tired, decreased energy 2 1 -  Change in appetite 2 1 -  Feeling bad or failure about yourself  0 0 -  Trouble concentrating 0 0 -  Moving slowly or fidgety/restless 0 0 -  Suicidal thoughts 0 0 -  PHQ-9 Score 7 4 -  Difficult doing work/chores Not difficult at all Not difficult at all -  Some recent data might be hidden     5. GAD (generalized anxiety disorder) Stays anxious is on xanax total of 2 1/2 tablet daily GAD 7 : Generalized Anxiety Score 10/16/2020 03/26/2020 09/24/2019 03/25/2019  Nervous, Anxious, on Edge 3 0 1 1  Control/stop worrying 3 3 3 1   Worry too much - different things 3 3 3 1   Trouble relaxing 3 0 0 1  Restless 0 0 0 0  Easily annoyed or irritable 1 1 1  0  Afraid - awful might happen 0 0 - 0  Total GAD 7 Score 13 7 - 4  Anxiety Difficulty Not difficult at all Not difficult  at all Not difficult at all Not difficult at all      6. Overweight (BMI 25.0-29.9) No recent weight changes. Wt Readings from Last 3 Encounters:  10/16/20 176 lb (79.8 kg)  03/26/20 178 lb (80.7 kg)  09/24/19 183 lb (83 kg)   BMI Readings from Last 3 Encounters:  10/16/20 32.19 kg/m  03/26/20 32.56 kg/m  09/24/19 33.47 kg/m       Outpatient Encounter Medications as of 10/16/2020  Medication Sig   acetaminophen (TYLENOL ARTHRITIS PAIN) 650 MG CR tablet Take 650 mg by mouth every 8 (eight) hours as needed for pain.   ALPRAZolam (XANAX) 1 MG tablet Take 1 tab QAM, 1/2 tab in the day and 1 tab QPM   atorvastatin (LIPITOR) 80 MG tablet Take 1 tablet (80 mg total) by mouth daily.   buPROPion (WELLBUTRIN XL) 300 MG 24 hr tablet Take 1 tablet (300 mg total) by mouth daily.   DULoxetine (CYMBALTA) 60 MG capsule Take 1-2 capsules (60-120 mg total) by mouth daily.   linaclotide (LINZESS) 290 MCG CAPS capsule Take 1 capsule (290 mcg total) by mouth daily as needed (For constipation.).   ondansetron (ZOFRAN) 4 MG tablet take 1  Tablet  by mouth 3 times daily BEFORE meals   pantoprazole (PROTONIX) 40 MG tablet Take 1 tablet (40 mg total) by mouth daily. Take 30 minutes before breakfast daily.   QUEtiapine (SEROQUEL) 400 MG tablet TAKE 2 TABLETS (800 MG TOTAL) BY MOUTH AT BEDTIME.   SUMAtriptan (IMITREX) 50 MG tablet Take 1 tablet (50 mg total) by mouth every 2 (two) hours as needed for migraine. May repeat in 2 hours if headache persists or recurs. Headache Treatment   topiramate (TOPAMAX) 100 MG tablet Take 1 tablet (100 mg total) by mouth 2 (two) times daily. Headache prevention   No facility-administered encounter medications on file as of 10/16/2020.    Past Surgical History:  Procedure Laterality Date   APPENDECTOMY     BACK SURGERY     CARPAL TUNNEL RELEASE     BILATERAL   COLONOSCOPY, ESOPHAGOGASTRODUODENOSCOPY (EGD) AND ESOPHAGEAL DILATION N/A 02/06/2013   RMR. Benign colon  polyp, diverticulosis, hemorrhoids. EGD with Schatzki's ring s/p 54 F dilation, 55mm elliptical prepyloric antral ulcer with adjacent scarring, negative path   ESOPHAGEAL MANOMETRY N/A 09/04/2015   Procedure: ESOPHAGEAL MANOMETRY (EM);  Surgeon: Wilford Corner, MD;  Location: [facility];  Service: Endoscopy;  Laterality: N/A;   ESOPHAGOGASTRODUODENOSCOPY N/A 09/23/2013   Dr. Gala Romney: non-critical Schatzki's ring, not manipulated, healed ulcer, gastric bx negative   ESOPHAGOGASTRODUODENOSCOPY (EGD) WITH PROPOFOL N/A 08/10/2015   Procedure: ESOPHAGOGASTRODUODENOSCOPY (EGD) WITH PROPOFOL;  Surgeon: Daneil Dolin, MD;  [facility];  Service: Endoscopy;  Laterality: N/A;  0830   INSERTION OF MESH N/A 10/02/2015   Procedure: INSERTION OF MESH;  Surgeon: [name], MD;  Location: [facility];  Service: General;  Laterality: N/A;   LUMBAR FUSION  02/12/2014   LEVEL 1 L4 L5    DR Palma Holter DILATION N/A 08/10/2015   Procedure: Venia Minks DILATION;  Surgeon: Daneil Dolin, MD;  [facility];  Service: Endoscopy;  Laterality: N/A;   PARTIAL KNEE ARTHROPLASTY Left 07/15/2013   Procedure: UNICOMPARTMENTAL LEFT KNEE MEDIAL ;  Surgeon: [name], MD;  Location: [facility];  Service: Orthopedics;  Laterality: Left;, Right uni knee done also.    Family History  Problem Relation Age of Onset   Heart attack Mother    Colon cancer Neg Hx     New complaints: None other then SOB stated above  Social history: Lives by herself- family check son her daily  Controlled substance contract: 10/16/20     Review of Systems  Constitutional:  Negative for diaphoresis.  Eyes:  Negative for pain.  Respiratory:  Positive for shortness of breath.   Cardiovascular:  Negative for chest pain, palpitations and leg swelling.  Gastrointestinal:  Negative for abdominal pain.  Endocrine: Negative for polydipsia.  Skin:  Negative for rash.  Neurological:  Negative for dizziness, weakness and  headaches.  Hematological:  Does not bruise/bleed easily.  All other systems reviewed and are negative.     Objective:   Physical Exam Vitals and nursing note reviewed.  Constitutional:      General: She is not in acute distress.    Appearance: Normal appearance. She is well-developed.  HENT:     Head: Normocephalic.     Nose: Nose normal.  Eyes:     Pupils: Pupils are equal, round, and reactive to light.  Neck:     Vascular: No carotid bruit or JVD.  Cardiovascular:     Rate and Rhythm: Normal rate and regular rhythm.  Heart sounds: Normal heart sounds.  Pulmonary:     Effort: Pulmonary effort is normal. No respiratory distress.     Breath sounds: Normal breath sounds. No wheezing or rales.  Chest:     Chest wall: No tenderness.  Abdominal:     General: Bowel sounds are normal. There is no distension or abdominal bruit.     Palpations: Abdomen is soft. There is no hepatomegaly, splenomegaly, mass or pulsatile mass.     Tenderness: There is no abdominal tenderness.  Musculoskeletal:        General: Normal range of motion.     Cervical back: Normal range of motion and neck supple.  Lymphadenopathy:     Cervical: No cervical adenopathy.  Skin:    General: Skin is warm and dry.  Neurological:     Mental Status: She is alert and oriented to person, place, and time.     Deep Tendon Reflexes: Reflexes are normal and symmetric.  Psychiatric:        Behavior: Behavior normal.        Thought Content: Thought content normal.        Judgment: Judgment normal.    BP 105/78   Pulse (!) 111   Temp 97.6 F (36.4 C)   Resp 20   Ht $R'5\' 2"'Gp$  (1.575 m)   Wt 176 lb (79.8 kg)   SpO2 98%   BMI 32.19 kg/m   EKG- atrial flutter-Mary-Margaret Hassell Done, FNP  [facility], FNP     Assessment & Plan:  [name] comes in today with chief complaint of Annual Exam   Diagnosis and orders addressed:  1. Annual physical exam Labs pending - Thyroid Panel  With TSH - Cytology - PAP  2. Essential hypertension Low sodium diet - CBC with Differential/Platelet - CMP14+EGFR  3. Other migraine without status migrainosus, not intractable Avoid caffeine - SUMAtriptan (IMITREX) 50 MG tablet; Take 1 tablet (50 mg total) by mouth every 2 (two) hours as needed for migraine. May repeat in 2 hours if headache persists or recurs. Headache Treatment  Dispense: 10 tablet; Refill: 5 - topiramate (TOPAMAX) 100 MG tablet; Take 1 tablet (100 mg total) by mouth 2 (two) times daily. Headache prevention  Dispense: 180 tablet; Refill: 1  4. Mixed hyperlipidemia Low fat diet - Lipid panel - atorvastatin (LIPITOR) 80 MG tablet; Take 1 tablet (80 mg total) by mouth daily.  Dispense: 90 tablet; Refill: 3  5. Depression, recurrent ([place]) Stress management  6. GAD (generalized anxiety disorder) - ALPRAZolam (XANAX) 1 MG tablet; Take 1 tab QAM, 1/2 tab in the day and 1 tab QPM  Dispense: 75 tablet; Refill: 5  7. BMI 32.0-32.9,adult Discussed diet and exercise for person with BMI >25 Will recheck weight in 3-6 months  8. Episodic mood disorder (HCC) - DULoxetine (CYMBALTA) 60 MG capsule; Take 1-2 capsules (60-120 mg total) by mouth daily.  Dispense: 180 capsule; Refill: 1 - buPROPion (WELLBUTRIN XL) 300 MG 24 hr tablet; Take 1 tablet (300 mg total) by mouth daily.  Dispense: 90 tablet; Refill: 1  9. PUD (peptic ulcer disease) - pantoprazole (PROTONIX) 40 MG tablet; Take 1 tablet (40 mg total) by mouth daily. Take 30 minutes before breakfast daily.  Dispense: 90 tablet; Refill: 1  10. DOE (dyspnea on exertion) Referral cardiology Added lasxi $RemoveBef'20mg'XOBrkIHdVT$  daily- possible CHF - EKG 12-Lead - DG Chest 2 View - Ambulatory referral to Cardiology  3 months Labs pending Health Maintenance reviewed Diet and exercise encouraged  Follow up plan: 3 months   Mary-Margaret Hassell Done, FNP

## 2020-10-17 LAB — CBC WITH DIFFERENTIAL/PLATELET
Basophils Absolute: 0.1 10*3/uL (ref 0.0–0.2)
Basos: 1 %
EOS (ABSOLUTE): 0 10*3/uL (ref 0.0–0.4)
Eos: 1 %
Hematocrit: 42.2 % (ref 34.0–46.6)
Hemoglobin: 14.6 g/dL (ref 11.1–15.9)
Immature Grans (Abs): 0 10*3/uL (ref 0.0–0.1)
Immature Granulocytes: 1 %
Lymphocytes Absolute: 1.2 10*3/uL (ref 0.7–3.1)
Lymphs: 18 %
MCH: 31.3 pg (ref 26.6–33.0)
MCHC: 34.6 g/dL (ref 31.5–35.7)
MCV: 91 fL (ref 79–97)
Monocytes Absolute: 0.5 10*3/uL (ref 0.1–0.9)
Monocytes: 8 %
Neutrophils Absolute: 4.6 10*3/uL (ref 1.4–7.0)
Neutrophils: 71 %
Platelets: 254 10*3/uL (ref 150–450)
RBC: 4.66 x10E6/uL (ref 3.77–5.28)
RDW: 15 % (ref 11.7–15.4)
WBC: 6.4 10*3/uL (ref 3.4–10.8)

## 2020-10-17 LAB — CMP14+EGFR
ALT: 15 IU/L (ref 0–32)
AST: 21 IU/L (ref 0–40)
Albumin/Globulin Ratio: 1.6 (ref 1.2–2.2)
Albumin: 4.9 g/dL — ABNORMAL HIGH (ref 3.8–4.8)
Alkaline Phosphatase: 136 IU/L — ABNORMAL HIGH (ref 44–121)
BUN/Creatinine Ratio: 14 (ref 12–28)
BUN: 21 mg/dL (ref 8–27)
Bilirubin Total: 0.3 mg/dL (ref 0.0–1.2)
CO2: 20 mmol/L (ref 20–29)
Calcium: 9.9 mg/dL (ref 8.7–10.3)
Chloride: 101 mmol/L (ref 96–106)
Creatinine, Ser: 1.54 mg/dL — ABNORMAL HIGH (ref 0.57–1.00)
Globulin, Total: 3 g/dL (ref 1.5–4.5)
Glucose: 111 mg/dL — ABNORMAL HIGH (ref 65–99)
Potassium: 4 mmol/L (ref 3.5–5.2)
Sodium: 142 mmol/L (ref 134–144)
Total Protein: 7.9 g/dL (ref 6.0–8.5)
eGFR: 37 mL/min/{1.73_m2} — ABNORMAL LOW (ref >59)

## 2020-10-17 LAB — THYROID PANEL WITH TSH
Free Thyroxine Index: 0.9 — ABNORMAL LOW (ref 1.2–4.9)
T3 Uptake Ratio: 28 % (ref 24–39)
T4, Total: 3.3 ug/dL — ABNORMAL LOW (ref 4.5–12.0)
TSH: 2.97 u[IU]/mL (ref 0.450–4.500)

## 2020-10-17 LAB — LIPID PANEL
Chol/HDL Ratio: 2.2 ratio (ref 0.0–4.4)
Cholesterol, Total: 201 mg/dL — ABNORMAL HIGH (ref 100–199)
HDL: 90 mg/dL (ref >39)
LDL Chol Calc (NIH): 89 mg/dL (ref 0–99)
Triglycerides: 129 mg/dL (ref 0–149)
VLDL Cholesterol Cal: 22 mg/dL (ref 5–40)

## 2020-10-17 LAB — BRAIN NATRIURETIC PEPTIDE: BNP: <2.5 pg/mL (ref 0.0–100.0)

## 2020-10-20 LAB — CYTOLOGY - PAP
Adequacy: ABSENT
Diagnosis: NEGATIVE

## 2020-11-15 ENCOUNTER — Other Ambulatory Visit: Payer: Self-pay | Admitting: Nurse Practitioner

## 2020-11-15 DIAGNOSIS — F39 Unspecified mood [affective] disorder: Secondary | ICD-10-CM

## 2020-12-14 ENCOUNTER — Other Ambulatory Visit: Payer: Self-pay | Admitting: Nurse Practitioner

## 2020-12-14 DIAGNOSIS — R0600 Dyspnea, unspecified: Secondary | ICD-10-CM

## 2020-12-14 DIAGNOSIS — R0609 Other forms of dyspnea: Secondary | ICD-10-CM

## 2021-01-01 NOTE — Progress Notes (Deleted)
Cardiology Office Note   Date:  01/01/2021   ID:  Nancy, Yang 05/03/1956, MRN [id number]  PCP:  Chevis Pretty, FNP  Cardiologist:   Jermaine Tholl [place], MD   No chief complaint on file.     History of Present Illness: [name] is a 65 y.o. female who is seen at the request of [place] FNP for evaluation of dyspnea on exertion. She has a history of HTN, HLD. GERD and bipolar disorder. Prior cardiac evaluation includes a normal Myoview study in 2012. Recent evaluation includes a BNP of < 2.5 and CXR showed minimal Atx.     Past Medical History:  Diagnosis Date   Anxiety    Arthritis    Back pain, chronic    Bipolar 1 disorder ([place])    Chest pain    "STRESS"; had normal nuclear stress test 02/2011; denied recent history 02/10/14   Depression    Difficulty sleeping    mind racing   GERD (gastroesophageal reflux disease)    Heart murmur    childhood   Hiatal hernia    History of stomach ulcers    Hypercholesterolemia    Hypertension    Palpitations     Past Surgical History:  Procedure Laterality Date   APPENDECTOMY     BACK SURGERY     CARPAL TUNNEL RELEASE     BILATERAL   COLONOSCOPY, ESOPHAGOGASTRODUODENOSCOPY (EGD) AND ESOPHAGEAL DILATION N/A 02/06/2013   RMR. Benign colon polyp, diverticulosis, hemorrhoids. EGD with Schatzki's ring s/p 54 F dilation, 100m elliptical prepyloric antral ulcer with adjacent scarring, negative path   ESOPHAGEAL MANOMETRY N/A 09/04/2015   Procedure: ESOPHAGEAL MANOMETRY (EM);  Surgeon: VWilford Corner MD;  [facility];  Service: Endoscopy;  Laterality: N/A;   ESOPHAGOGASTRODUODENOSCOPY N/A 09/23/2013   Dr. RGala Romney non-critical Schatzki's ring, not manipulated, healed ulcer, gastric bx negative   ESOPHAGOGASTRODUODENOSCOPY (EGD) WITH PROPOFOL N/A 08/10/2015   Procedure: ESOPHAGOGASTRODUODENOSCOPY (EGD) WITH PROPOFOL;  Surgeon: RDaneil Dolin MD;  [facility];  Service: Endoscopy;   Laterality: N/A;  0830   INSERTION OF MESH N/A 10/02/2015   Procedure: INSERTION OF MESH;  Surgeon: ARalene Ok MD;  [facility];  Service: General;  Laterality: N/A;   LUMBAR FUSION  02/12/2014   LEVEL 1 L4 L5    DR BPalma HolterDILATION N/A 08/10/2015   Procedure: MVenia MinksDILATION;  Surgeon: RDaneil Dolin MD;  [facility];  Service: Endoscopy;  Laterality: N/A;   PARTIAL KNEE ARTHROPLASTY Left 07/15/2013   Procedure: UNICOMPARTMENTAL LEFT KNEE MEDIAL ;  Surgeon: MMauri Pole MD;  [facility];  Service: Orthopedics;  Laterality: Left;, Right uni knee done also.     Current Outpatient Medications  Medication Sig Dispense Refill   acetaminophen (TYLENOL) 650 MG CR tablet Take 650 mg by mouth every 8 (eight) hours as needed for pain.     ALPRAZolam (XANAX) 1 MG tablet Take 1 tab QAM, 1/2 tab in the day and 1 tab QPM 75 tablet 5   atorvastatin (LIPITOR) 80 MG tablet Take 1 tablet (80 mg total) by mouth daily. 90 tablet 3   buPROPion (WELLBUTRIN XL) 300 MG 24 hr tablet Take 1 tablet (300 mg total) by mouth daily. 90 tablet 1   DULoxetine (CYMBALTA) 60 MG capsule Take 1-2 capsules (60-120 mg total) by mouth daily. 180 capsule 1   furosemide (LASIX) 20 MG tablet TAKE 1 TABLET BY MOUTH EVERY DAY 90 tablet 1  linaclotide (LINZESS) 290 MCG CAPS capsule Take 1 capsule (290 mcg total) by mouth daily as needed (For constipation.). 30 capsule 11   ondansetron (ZOFRAN) 4 MG tablet take 1  Tablet by mouth 3 times daily BEFORE meals 90 tablet 1   pantoprazole (PROTONIX) 40 MG tablet Take 1 tablet (40 mg total) by mouth daily. Take 30 minutes before breakfast daily. 90 tablet 1   QUEtiapine (SEROQUEL) 400 MG tablet TAKE 2 TABLETS (800 MG TOTAL) BY MOUTH AT BEDTIME. 180 tablet 1   SUMAtriptan (IMITREX) 50 MG tablet Take 1 tablet (50 mg total) by mouth every 2 (two) hours as needed for migraine. May repeat in 2 hours if headache persists or recurs. Headache Treatment 10 tablet 5    topiramate (TOPAMAX) 100 MG tablet Take 1 tablet (100 mg total) by mouth 2 (two) times daily. Headache prevention 180 tablet 1   No current facility-administered medications for this visit.    Allergies:   Morphine and related    Social History:  The patient  reports that she has never smoked. She has never used smokeless tobacco. She reports that she does not drink alcohol and does not use drugs.   Family History:  The patient's ***family history includes Heart attack in her mother.    ROS:  Please see the history of present illness.   Otherwise, review of systems are positive for {NONE DEFAULTED:18576}.   All other systems are reviewed and negative.    PHYSICAL EXAM: VS:  There were no vitals taken for this visit. , BMI There is no height or weight on file to calculate BMI. GEN: Well nourished, well developed, in no acute distress HEENT: normal Neck: no JVD, carotid bruits, or masses Cardiac: ***RRR; no murmurs, rubs, or gallops,no edema  Respiratory:  clear to auscultation bilaterally, normal work of breathing GI: soft, nontender, nondistended, + BS MS: no deformity or atrophy Skin: warm and dry, no rash Neuro:  Strength and sensation are intact Psych: euthymic mood, full affect   EKG:  EKG {ACTION; IS/IS [id number] ordered today. The ekg ordered today demonstrates ***   Recent Labs: 10/16/2020: ALT 15; BNP <2.5; BUN 21; Creatinine, Ser 1.54; Hemoglobin 14.6; Platelets 254; Potassium 4.0; Sodium 142; TSH 2.970    Lipid Panel    Component Value Date/Time   CHOL 201 (H) 10/16/2020 1251   TRIG 129 10/16/2020 1251   HDL 90 10/16/2020 1251   CHOLHDL 2.2 10/16/2020 1251   LDLCALC 89 10/16/2020 1251      Wt Readings from Last 3 Encounters:  10/16/20 176 lb (79.8 kg)  03/26/20 178 lb (80.7 kg)  09/24/19 183 lb (83 kg)      Other studies Reviewed: Additional studies/ records that were reviewed today include: ***. Review of the above records demonstrates:  ***   ASSESSMENT AND PLAN:  1.  ***   Current medicines are reviewed at length with the patient today.  The patient {ACTIONS; HAS/DOES NOT HAVE:19233} concerns regarding medicines.  The following changes have been made:  {PLAN; NO CHANGE:13088:s}  Labs/ tests ordered today include: *** No orders of the defined types were placed in this encounter.    Disposition:   FU with *** in {gen number [id number] {Days to years:10300}  Signed, Nitika Jackowski [place], MD  01/01/2021 8:23 AM    [facility] [address], [place], [place], [address] Phone [phone], Fax [phone]

## 2021-01-04 ENCOUNTER — Ambulatory Visit: Payer: Medicaid Other | Admitting: Cardiology

## 2021-01-06 ENCOUNTER — Other Ambulatory Visit: Payer: Self-pay | Admitting: Nurse Practitioner

## 2021-01-06 DIAGNOSIS — F39 Unspecified mood [affective] disorder: Secondary | ICD-10-CM

## 2021-01-17 ENCOUNTER — Other Ambulatory Visit: Payer: Self-pay | Admitting: Nurse Practitioner

## 2021-01-17 DIAGNOSIS — F39 Unspecified mood [affective] disorder: Secondary | ICD-10-CM

## 2021-02-03 ENCOUNTER — Inpatient Hospital Stay: Admission: RE | Admit: 2021-02-03 | Payer: Medicaid Other | Source: Ambulatory Visit

## 2021-02-09 ENCOUNTER — Other Ambulatory Visit: Payer: Self-pay | Admitting: Nurse Practitioner

## 2021-02-09 DIAGNOSIS — F39 Unspecified mood [affective] disorder: Secondary | ICD-10-CM

## 2021-02-11 ENCOUNTER — Other Ambulatory Visit: Payer: Self-pay | Admitting: Nurse Practitioner

## 2021-02-11 DIAGNOSIS — F39 Unspecified mood [affective] disorder: Secondary | ICD-10-CM

## 2021-02-15 ENCOUNTER — Other Ambulatory Visit: Payer: Self-pay | Admitting: Nurse Practitioner

## 2021-02-15 DIAGNOSIS — F39 Unspecified mood [affective] disorder: Secondary | ICD-10-CM

## 2021-02-15 DIAGNOSIS — K279 Peptic ulcer, site unspecified, unspecified as acute or chronic, without hemorrhage or perforation: Secondary | ICD-10-CM

## 2021-02-26 ENCOUNTER — Encounter: Payer: Self-pay | Admitting: Nurse Practitioner

## 2021-02-26 ENCOUNTER — Other Ambulatory Visit: Payer: Self-pay

## 2021-02-26 ENCOUNTER — Ambulatory Visit: Payer: Medicare Other | Admitting: Nurse Practitioner

## 2021-02-26 VITALS — BP 139/101 | HR 107 | Temp 97.2°F | Resp 20 | Ht 62.0 in | Wt 171.0 lb

## 2021-02-26 DIAGNOSIS — R1319 Other dysphagia: Secondary | ICD-10-CM | POA: Diagnosis not present

## 2021-02-26 DIAGNOSIS — F339 Major depressive disorder, recurrent, unspecified: Secondary | ICD-10-CM

## 2021-02-26 DIAGNOSIS — E663 Overweight: Secondary | ICD-10-CM | POA: Diagnosis not present

## 2021-02-26 DIAGNOSIS — F5101 Primary insomnia: Secondary | ICD-10-CM | POA: Diagnosis not present

## 2021-02-26 DIAGNOSIS — K279 Peptic ulcer, site unspecified, unspecified as acute or chronic, without hemorrhage or perforation: Secondary | ICD-10-CM

## 2021-02-26 DIAGNOSIS — E782 Mixed hyperlipidemia: Secondary | ICD-10-CM | POA: Diagnosis not present

## 2021-02-26 DIAGNOSIS — F411 Generalized anxiety disorder: Secondary | ICD-10-CM | POA: Diagnosis not present

## 2021-02-26 DIAGNOSIS — I10 Essential (primary) hypertension: Secondary | ICD-10-CM | POA: Diagnosis not present

## 2021-02-26 DIAGNOSIS — R7989 Other specified abnormal findings of blood chemistry: Secondary | ICD-10-CM

## 2021-02-26 DIAGNOSIS — F39 Unspecified mood [affective] disorder: Secondary | ICD-10-CM | POA: Diagnosis not present

## 2021-02-26 MED ORDER — BUPROPION HCL ER (XL) 300 MG PO TB24: 300.0000 mg | ORAL_TABLET | Freq: Every day | ORAL | 1 refills | Status: AC

## 2021-02-26 MED ORDER — ALPRAZOLAM 1 MG PO TABS: ORAL_TABLET | ORAL | 5 refills | Status: AC

## 2021-02-26 MED ORDER — ATORVASTATIN CALCIUM 80 MG PO TABS
80.0000 mg | ORAL_TABLET | Freq: Every day | ORAL | 3 refills | Status: AC
Start: 2021-02-26 — End: ?

## 2021-02-26 MED ORDER — DULOXETINE HCL 60 MG PO CPEP: 60.0000 mg | ORAL_CAPSULE | Freq: Every day | ORAL | 0 refills | Status: AC

## 2021-02-26 MED ORDER — PANTOPRAZOLE SODIUM 40 MG PO TBEC
40.0000 mg | DELAYED_RELEASE_TABLET | Freq: Every day | ORAL | 0 refills | Status: AC
Start: 2021-02-26 — End: ?

## 2021-02-26 MED ORDER — QUETIAPINE FUMARATE 400 MG PO TABS: 800.0000 mg | ORAL_TABLET | Freq: Every day | ORAL | 0 refills | Status: DC

## 2021-02-26 NOTE — Patient Instructions (Signed)

## 2021-02-26 NOTE — Addendum Note (Signed)
Addended by: Rolena Infante on: 02/26/2021 12:41 PM   Modules accepted: Orders

## 2021-02-26 NOTE — Progress Notes (Signed)
Subjective:    Patient ID: [name], female    DOB: 20-Dec-1955, 65 y.o.   MRN: [id number]   Chief Complaint: Medical Management of Chronic Issues    HPI:  1. Essential hypertension No c/o chest pain, sob or headache. Does not check blood pressure at home. BP Readings from Last 3 Encounters:  02/26/21 (!) 139/101  10/16/20 105/78  03/26/20 130/87     2. PUD (peptic ulcer disease) Is on protonix an dis doing well.  3. Esophageal dysphagia No problems swallowing as of late  4. Mixed hyperlipidemia Does watch diet and stays as active as she can Lab Results  Component Value Date   CHOL 201 (H) 10/16/2020   HDL 90 10/16/2020   LDLCALC 89 10/16/2020   TRIG 129 10/16/2020   CHOLHDL 2.2 10/16/2020     5. Depression, recurrent ([place]) Is on cymbalta. Seroquel and wellbutrin. She says her depression comes and goes. Doing well right now. Depression screen [facility] 2/9 02/26/2021 10/16/2020 03/26/2020  Decreased Interest 0 0 1  Down, Depressed, Hopeless 1 1 1   PHQ - 2 Score 1 1 2   Altered sleeping 2 2 0  Tired, decreased energy 2 2 1   Change in appetite 2 2 1   Feeling bad or failure about yourself  0 0 0  Trouble concentrating 0 0 0  Moving slowly or fidgety/restless 0 0 0  Suicidal thoughts 0 0 0  PHQ-9 Score 7 7 4   Difficult doing work/chores Not difficult at all Not difficult at all Not difficult at all  Some recent data might be hidden     6. GAD (generalized anxiety disorder) Is on lasix daily. GAD 7 : Generalized Anxiety Score 02/26/2021 10/16/2020 03/26/2020 09/24/2019  Nervous, Anxious, on Edge 2 3 0 1  Control/stop worrying 2 3 3 3   Worry too much - different things 2 3 3 3   Trouble relaxing 1 3 0 0  Restless 0 0 0 0  Easily annoyed or irritable 0 1 1 1   Afraid - awful might happen 0 0 0 -  Total GAD 7 Score 7 13 7  -  Anxiety Difficulty Not difficult at all Not difficult at all Not difficult at all Not difficult at all      7. Primary insomnia Has  been sleeping well. The seroquel helps her to sleep at night  8. Overweight (BMI 25.0-29.9) Weight is down 5lbs Wt Readings from Last 3 Encounters:  02/26/21 171 lb (77.6 kg)  10/16/20 176 lb (79.8 kg)  03/26/20 178 lb (80.7 kg)   BMI Readings from Last 3 Encounters:  02/26/21 31.28 kg/m  10/16/20 32.19 kg/m  03/26/20 32.56 kg/m       Outpatient Encounter Medications as of 02/26/2021  Medication Sig   acetaminophen (TYLENOL) 650 MG CR tablet Take 650 mg by mouth every 8 (eight) hours as needed for pain.   ALPRAZolam (XANAX) 1 MG tablet Take 1 tab QAM, 1/2 tab in the day and 1 tab QPM   atorvastatin (LIPITOR) 80 MG tablet Take 1 tablet (80 mg total) by mouth daily.   buPROPion (WELLBUTRIN XL) 300 MG 24 hr tablet TAKE 1 TABLET BY MOUTH EVERY DAY   DULoxetine (CYMBALTA) 60 MG capsule TAKE 1-2 CAPSULES (60-120 MG TOTAL) BY MOUTH DAILY.   furosemide (LASIX) 20 MG tablet TAKE 1 TABLET BY MOUTH EVERY DAY   linaclotide (LINZESS) 290 MCG CAPS capsule Take 1 capsule (290 mcg total) by mouth daily as needed (For constipation.).  ondansetron (ZOFRAN) 4 MG tablet take 1  Tablet by mouth 3 times daily BEFORE meals   pantoprazole (PROTONIX) 40 MG tablet TAKE 1 TABLET (40 MG TOTAL) BY MOUTH DAILY. TAKE 30 MINUTES BEFORE BREAKFAST DAILY.   QUEtiapine (SEROQUEL) 400 MG tablet TAKE 2 TABLETS (800 MG TOTAL) BY MOUTH AT BEDTIME.   SUMAtriptan (IMITREX) 50 MG tablet Take 1 tablet (50 mg total) by mouth every 2 (two) hours as needed for migraine. May repeat in 2 hours if headache persists or recurs. Headache Treatment   topiramate (TOPAMAX) 100 MG tablet Take 1 tablet (100 mg total) by mouth 2 (two) times daily. Headache prevention   No facility-administered encounter medications on file as of 02/26/2021.    Past Surgical History:  Procedure Laterality Date   APPENDECTOMY     BACK SURGERY     CARPAL TUNNEL RELEASE     BILATERAL   COLONOSCOPY, ESOPHAGOGASTRODUODENOSCOPY (EGD) AND ESOPHAGEAL  DILATION N/A 02/06/2013   RMR. Benign colon polyp, diverticulosis, hemorrhoids. EGD with Schatzki's ring s/p 54 F dilation, 99mm elliptical prepyloric antral ulcer with adjacent scarring, negative path   ESOPHAGEAL MANOMETRY N/A 09/04/2015   Procedure: ESOPHAGEAL MANOMETRY (EM);  Surgeon: Wilford Corner, MD;  Location: [facility];  Service: Endoscopy;  Laterality: N/A;   ESOPHAGOGASTRODUODENOSCOPY N/A 09/23/2013   Dr. Gala Romney: non-critical Schatzki's ring, not manipulated, healed ulcer, gastric bx negative   ESOPHAGOGASTRODUODENOSCOPY (EGD) WITH PROPOFOL N/A 08/10/2015   Procedure: ESOPHAGOGASTRODUODENOSCOPY (EGD) WITH PROPOFOL;  Surgeon: Daneil Dolin, MD;  [facility];  Service: Endoscopy;  Laterality: N/A;  0830   INSERTION OF MESH N/A 10/02/2015   Procedure: INSERTION OF MESH;  Surgeon: [name], MD;  Location: [facility];  Service: General;  Laterality: N/A;   LUMBAR FUSION  02/12/2014   LEVEL 1 L4 L5    DR Palma Holter DILATION N/A 08/10/2015   Procedure: Venia Minks DILATION;  Surgeon: Daneil Dolin, MD;  [facility];  Service: Endoscopy;  Laterality: N/A;   PARTIAL KNEE ARTHROPLASTY Left 07/15/2013   Procedure: UNICOMPARTMENTAL LEFT KNEE MEDIAL ;  Surgeon: [name], MD;  Location: [facility];  Service: Orthopedics;  Laterality: Left;, Right uni knee done also.    Family History  Problem Relation Age of Onset   Heart attack Mother    Colon cancer Neg Hx     New complaints: None today  Social history: Lives by herself and has 2 horses that she takes care of   Controlled substance contract: 02/26/21     Review of Systems  Constitutional:  Negative for diaphoresis.  Eyes:  Negative for pain.  Respiratory:  Negative for shortness of breath.   Cardiovascular:  Negative for chest pain, palpitations and leg swelling.  Gastrointestinal:  Negative for abdominal pain.  Endocrine: Negative for polydipsia.  Skin:  Negative for rash.  Neurological:  Negative  for dizziness, weakness and headaches.  Hematological:  Does not bruise/bleed easily.  All other systems reviewed and are negative.     Objective:   Physical Exam Vitals and nursing note reviewed.  Constitutional:      General: She is not in acute distress.    Appearance: Normal appearance. She is well-developed.  HENT:     Head: Normocephalic.     Comments: Hair is all matted together    Right Ear: Tympanic membrane normal.     Left Ear: Tympanic membrane normal.     Nose: Nose normal.     Mouth/Throat:  Mouth: Mucous membranes are moist.  Eyes:     Pupils: Pupils are equal, round, and reactive to light.  Neck:     Vascular: No carotid bruit or JVD.  Cardiovascular:     Rate and Rhythm: Normal rate and regular rhythm.     Heart sounds: Normal heart sounds.  Pulmonary:     Effort: Pulmonary effort is normal. No respiratory distress.     Breath sounds: Normal breath sounds. No wheezing or rales.  Chest:     Chest wall: No tenderness.  Abdominal:     General: Bowel sounds are normal. There is no distension or abdominal bruit.     Palpations: Abdomen is soft. There is no hepatomegaly, splenomegaly, mass or pulsatile mass.     Tenderness: There is no abdominal tenderness.  Musculoskeletal:        General: Normal range of motion.     Cervical back: Normal range of motion and neck supple.  Lymphadenopathy:     Cervical: No cervical adenopathy.  Skin:    General: Skin is warm and dry.  Neurological:     Mental Status: She is alert and oriented to person, place, and time.     Deep Tendon Reflexes: Reflexes are normal and symmetric.  Psychiatric:        Behavior: Behavior normal.        Thought Content: Thought content normal.        Judgment: Judgment normal.    BP (!) 139/101   Pulse (!) 107   Temp (!) 97.2 F (36.2 C) (Temporal)   Resp 20   Ht 5\' 2"  (1.575 m)   Wt 171 lb (77.6 kg)   SpO2 98%   BMI 31.28 kg/m        Assessment & Plan:  [name]  comes in today with chief complaint of Medical Management of Chronic Issues   Diagnosis and orders addressed:  1. Essential hypertension Low sodium diet  2. PUD (peptic ulcer disease) - pantoprazole (PROTONIX) 40 MG tablet; Take 1 tablet (40 mg total) by mouth daily. Take 30 minutes before breakfast daily.  Dispense: 90 tablet; Refill: 0  3. Esophageal dysphagia  4. Mixed hyperlipidemia Low fat diet - atorvastatin (LIPITOR) 80 MG tablet; Take 1 tablet (80 mg total) by mouth daily.  Dispense: 90 tablet; Refill: 3  5. Depression, recurrent ([place]) Stress management  6. GAD (generalized anxiety disorder) - ALPRAZolam (XANAX) 1 MG tablet; Take 1 tab QAM, 1/2 tab in the day and 1 tab QPM  Dispense: 75 tablet; Refill: 5  7. Primary insomnia Bedtime routine  8. Overweight (BMI 25.0-29.9) Discussed diet and exercise for person with BMI >25 Will recheck weight in 3-6 months  9. Episodic mood disorder (HCC) - DULoxetine (CYMBALTA) 60 MG capsule; Take 1-2 capsules (60-120 mg total) by mouth daily.  Dispense: 180 capsule; Refill: 0 - buPROPion (WELLBUTRIN XL) 300 MG 24 hr tablet; Take 1 tablet (300 mg total) by mouth daily.  Dispense: 90 tablet; Refill: 1 - QUEtiapine (SEROQUEL) 400 MG tablet; Take 2 tablets (800 mg total) by mouth at bedtime.  Dispense: 180 tablet; Refill: 0 Discussed hygiene  Labs pending Health Maintenance reviewed Diet and exercise encouraged  Follow up plan: 6 months   Mary-Margaret Hassell Done, FNP

## 2021-02-27 LAB — CBC WITH DIFFERENTIAL/PLATELET
Basophils Absolute: 0.1 10*3/uL (ref 0.0–0.2)
Basos: 1 %
EOS (ABSOLUTE): 0.1 10*3/uL (ref 0.0–0.4)
Eos: 1 %
Hematocrit: 42.5 % (ref 34.0–46.6)
Hemoglobin: 13.9 g/dL (ref 11.1–15.9)
Immature Grans (Abs): 0 10*3/uL (ref 0.0–0.1)
Immature Granulocytes: 0 %
Lymphocytes Absolute: 1.4 10*3/uL (ref 0.7–3.1)
Lymphs: 33 %
MCH: 29.3 pg (ref 26.6–33.0)
MCHC: 32.7 g/dL (ref 31.5–35.7)
MCV: 90 fL (ref 79–97)
Monocytes Absolute: 0.4 10*3/uL (ref 0.1–0.9)
Monocytes: 8 %
Neutrophils Absolute: 2.4 10*3/uL (ref 1.4–7.0)
Neutrophils: 57 %
Platelets: 211 10*3/uL (ref 150–450)
RBC: 4.74 x10E6/uL (ref 3.77–5.28)
RDW: 14.1 % (ref 11.7–15.4)
WBC: 4.3 10*3/uL (ref 3.4–10.8)

## 2021-02-27 LAB — LIPID PANEL
Chol/HDL Ratio: 3.2 ratio (ref 0.0–4.4)
Cholesterol, Total: 212 mg/dL — ABNORMAL HIGH (ref 100–199)
HDL: 67 mg/dL (ref >39)
LDL Chol Calc (NIH): 129 mg/dL — ABNORMAL HIGH (ref 0–99)
Triglycerides: 91 mg/dL (ref 0–149)
VLDL Cholesterol Cal: 16 mg/dL (ref 5–40)

## 2021-02-27 LAB — THYROID PANEL WITH TSH
Free Thyroxine Index: 2.4 (ref 1.2–4.9)
T3 Uptake Ratio: 29 % (ref 24–39)
T4, Total: 8.2 ug/dL (ref 4.5–12.0)
TSH: 1.37 u[IU]/mL (ref 0.450–4.500)

## 2021-02-27 LAB — CMP14+EGFR
ALT: 18 IU/L (ref 0–32)
AST: 25 IU/L (ref 0–40)
Albumin/Globulin Ratio: 1.9 (ref 1.2–2.2)
Albumin: 4.6 g/dL (ref 3.8–4.8)
Alkaline Phosphatase: 135 IU/L — ABNORMAL HIGH (ref 44–121)
BUN/Creatinine Ratio: 15 (ref 12–28)
BUN: 15 mg/dL (ref 8–27)
Bilirubin Total: 0.3 mg/dL (ref 0.0–1.2)
CO2: 21 mmol/L (ref 20–29)
Calcium: 9.7 mg/dL (ref 8.7–10.3)
Chloride: 106 mmol/L (ref 96–106)
Creatinine, Ser: 1.03 mg/dL — ABNORMAL HIGH (ref 0.57–1.00)
Globulin, Total: 2.4 g/dL (ref 1.5–4.5)
Glucose: 107 mg/dL — ABNORMAL HIGH (ref 70–99)
Potassium: 3.4 mmol/L — ABNORMAL LOW (ref 3.5–5.2)
Sodium: 142 mmol/L (ref 134–144)
Total Protein: 7 g/dL (ref 6.0–8.5)
eGFR: 60 mL/min/{1.73_m2} (ref >59)

## 2021-03-05 ENCOUNTER — Telehealth: Payer: Medicare Other | Admitting: Nurse Practitioner

## 2021-03-05 ENCOUNTER — Ambulatory Visit (INDEPENDENT_AMBULATORY_CARE_PROVIDER_SITE_OTHER): Payer: Medicare Other | Admitting: Nurse Practitioner

## 2021-03-05 ENCOUNTER — Encounter: Payer: Self-pay | Admitting: Nurse Practitioner

## 2021-03-05 DIAGNOSIS — A084 Viral intestinal infection, unspecified: Secondary | ICD-10-CM | POA: Diagnosis not present

## 2021-03-05 MED ORDER — ONDANSETRON HCL 4 MG PO TABS: 4.0000 mg | ORAL_TABLET | Freq: Three times a day (TID) | ORAL | 0 refills | Status: DC | PRN

## 2021-03-05 NOTE — Progress Notes (Signed)
Virtual Visit  Note Due to COVID-19 pandemic this visit was conducted virtually. This visit type was conducted due to national recommendations for restrictions regarding the COVID-19 Pandemic (e.g. social distancing, sheltering in place) in an effort to limit this patient's exposure and mitigate transmission in our community. All issues noted in this document were discussed and addressed.  A physical exam was not performed with this format.  I connected with [name] on 03/05/21 at 8:43 by telephone and verified that I am speaking with the correct person using two identifiers. [name] is currently located at home and no ne is currently with her during visit. The provider, Mary-Margaret Hassell Done, FNP is located in their office at time of visit.  I discussed the limitations, risks, security and privacy concerns of performing an evaluation and management service by telephone and the availability of in person appointments. I also discussed with the patient that there may be a patient responsible charge related to this service. The patient expressed understanding and agreed to proceed.   History and Present Illness:  Patiuent says she started vomiting and diarrhea yesterday. Has chills. Unable to sleep because she feels so bad. If she etas anything she says it comes right back up.     Review of Systems  Constitutional:  Positive for chills and malaise/fatigue. Negative for fever.  HENT: Negative.    Respiratory: Negative.    Cardiovascular: Negative.   Gastrointestinal:  Positive for diarrhea, nausea and vomiting. Negative for abdominal pain.  Neurological: Negative.   Psychiatric/Behavioral: Negative.      Observations/Objective: Alert and oriented- answers all questions appropriately No distress   Assessment and Plan: [name] in today with chief complaint of No chief complaint on file.   1. Viral gastroenteritis First 24 Hours-Clear  liquids  popsicles  Jello  gatorade  Sprite Second 24 hours-Add Full liquids ( Liquids you cant see through) Third 24 hours- Bland diet ( foods that are baked or broiled)  *avoiding fried foods and highly spiced foods* During these 3 days  Avoid milk, cheese, ice cream or any other dairy products  Avoid caffeine- REMEMBER Mt. Dew and Mello Yellow contain lots of caffeine You should eat and drink in  Frequent small volumes If no improvement in symptoms or worsen in 2-3 days should RETRUN TO OFFICE or go to ER!   Imodium AD OTC for diarrhea Meds ordered this encounter  Medications   ondansetron (ZOFRAN) 4 MG tablet    Sig: Take 1 tablet (4 mg total) by mouth every 8 (eight) hours as needed for nausea or vomiting.    Dispense:  20 tablet    Refill:  0    Order Specific Question:   Supervising Provider    Answer:   Caryl Pina A [[id number]]        Follow Up Instructions: prn    I discussed the assessment and treatment plan with the patient. The patient was provided an opportunity to ask questions and all were answered. The patient agreed with the plan and demonstrated an understanding of the instructions.   The patient was advised to call back or seek an in-person evaluation if the symptoms worsen or if the condition fails to improve as anticipated.  The above assessment and management plan was discussed with the patient. The patient verbalized understanding of and has agreed to the management plan. Patient is aware to call the clinic if symptoms persist or worsen. Patient is aware when to return to  the clinic for a follow-up visit. Patient educated on when it is appropriate to go to the emergency department.   Time call ended:  8:55  I provided 12 minutes of  non face-to-face time during this encounter.    Mary-Margaret Hassell Done, FNP

## 2021-03-05 NOTE — Progress Notes (Deleted)
Cardiology Office Note   Date:  03/05/2021   ID:  [name], [name] 05-Jan-1956, MRN [id number]  PCP:  Chevis Pretty, FNP  Cardiologist:   Luevenia Mcavoy [place], MD   No chief complaint on file.     History of Present Illness: [name] is a 65 y.o. female who is seen at the request of [place] FNP for evaluation of dyspnea. Has history of GERD, HTN, HLD. She had a normal nuclear stress test in October 2012.     Past Medical History:  Diagnosis Date   Anxiety    Arthritis    Back pain, chronic    Bipolar 1 disorder ([place])    Chest pain    "STRESS"; had normal nuclear stress test 02/2011; denied recent history 02/10/14   Depression    Difficulty sleeping    mind racing   GERD (gastroesophageal reflux disease)    Heart murmur    childhood   Hiatal hernia    History of stomach ulcers    Hypercholesterolemia    Hypertension    Palpitations     Past Surgical History:  Procedure Laterality Date   APPENDECTOMY     BACK SURGERY     CARPAL TUNNEL RELEASE     BILATERAL   COLONOSCOPY, ESOPHAGOGASTRODUODENOSCOPY (EGD) AND ESOPHAGEAL DILATION N/A 02/06/2013   RMR. Benign colon polyp, diverticulosis, hemorrhoids. EGD with Schatzki's ring s/p 54 F dilation, 45mm elliptical prepyloric antral ulcer with adjacent scarring, negative path   ESOPHAGEAL MANOMETRY N/A 09/04/2015   Procedure: ESOPHAGEAL MANOMETRY (EM);  Surgeon: Wilford Corner, MD;  [facility];  Service: Endoscopy;  Laterality: N/A;   ESOPHAGOGASTRODUODENOSCOPY N/A 09/23/2013   Dr. Gala Romney: non-critical Schatzki's ring, not manipulated, healed ulcer, gastric bx negative   ESOPHAGOGASTRODUODENOSCOPY (EGD) WITH PROPOFOL N/A 08/10/2015   Procedure: ESOPHAGOGASTRODUODENOSCOPY (EGD) WITH PROPOFOL;  Surgeon: Daneil Dolin, MD;  [facility];  Service: Endoscopy;  Laterality: N/A;  0830   INSERTION OF MESH N/A 10/02/2015   Procedure: INSERTION OF MESH;  Surgeon: [name], MD;   [facility];  Service: General;  Laterality: N/A;   LUMBAR FUSION  02/12/2014   LEVEL 1 L4 L5    DR Palma Holter DILATION N/A 08/10/2015   Procedure: Venia Minks DILATION;  Surgeon: Daneil Dolin, MD;  [facility];  Service: Endoscopy;  Laterality: N/A;   PARTIAL KNEE ARTHROPLASTY Left 07/15/2013   Procedure: UNICOMPARTMENTAL LEFT KNEE MEDIAL ;  Surgeon: [name], MD;  [facility];  Service: Orthopedics;  Laterality: Left;, Right uni knee done also.     Current Outpatient Medications  Medication Sig Dispense Refill   acetaminophen (TYLENOL) 650 MG CR tablet Take 650 mg by mouth every 8 (eight) hours as needed for pain.     ALPRAZolam (XANAX) 1 MG tablet Take 1 tab QAM, 1/2 tab in the day and 1 tab QPM 75 tablet 5   atorvastatin (LIPITOR) 80 MG tablet Take 1 tablet (80 mg total) by mouth daily. 90 tablet 3   buPROPion (WELLBUTRIN XL) 300 MG 24 hr tablet Take 1 tablet (300 mg total) by mouth daily. 90 tablet 1   DULoxetine (CYMBALTA) 60 MG capsule Take 1-2 capsules (60-120 mg total) by mouth daily. 180 capsule 0   furosemide (LASIX) 20 MG tablet TAKE 1 TABLET BY MOUTH EVERY DAY 90 tablet 1   linaclotide (LINZESS) 290 MCG CAPS capsule Take 1 capsule (290 mcg total) by mouth daily as needed (For  constipation.). 30 capsule 11   ondansetron (ZOFRAN) 4 MG tablet Take 1 tablet (4 mg total) by mouth every 8 (eight) hours as needed for nausea or vomiting. 20 tablet 0   pantoprazole (PROTONIX) 40 MG tablet Take 1 tablet (40 mg total) by mouth daily. Take 30 minutes before breakfast daily. 90 tablet 0   QUEtiapine (SEROQUEL) 400 MG tablet Take 2 tablets (800 mg total) by mouth at bedtime. 180 tablet 0   SUMAtriptan (IMITREX) 50 MG tablet Take 1 tablet (50 mg total) by mouth every 2 (two) hours as needed for migraine. May repeat in 2 hours if headache persists or recurs. Headache Treatment 10 tablet 5   topiramate (TOPAMAX) 100 MG tablet Take 1 tablet (100 mg total) by mouth 2  (two) times daily. Headache prevention 180 tablet 1   No current facility-administered medications for this visit.    Allergies:   Morphine and related    Social History:  The patient  reports that she has never smoked. She has never used smokeless tobacco. She reports that she does not drink alcohol and does not use drugs.   Family History:  The patient's ***family history includes Heart attack in her mother.    ROS:  Please see the history of present illness.   Otherwise, review of systems are positive for {NONE DEFAULTED:18576}.   All other systems are reviewed and negative.    PHYSICAL EXAM: VS:  There were no vitals taken for this visit. , BMI There is no height or weight on file to calculate BMI. GEN: Well nourished, well developed, in no acute distress HEENT: normal Neck: no JVD, carotid bruits, or masses Cardiac: ***RRR; no murmurs, rubs, or gallops,no edema  Respiratory:  clear to auscultation bilaterally, normal work of breathing GI: soft, nontender, nondistended, + BS MS: no deformity or atrophy Skin: warm and dry, no rash Neuro:  Strength and sensation are intact Psych: euthymic mood, full affect   EKG:  EKG {ACTION; IS/IS [id number]} ordered today. The ekg ordered today demonstrates ***   Recent Labs: 10/16/2020: BNP <2.5 02/26/2021: ALT 18; BUN 15; Creatinine, Ser 1.03; Hemoglobin 13.9; Platelets 211; Potassium 3.4; Sodium 142; TSH 1.370    Lipid Panel    Component Value Date/Time   CHOL 212 (H) 02/26/2021 1243   TRIG 91 02/26/2021 1243   HDL 67 02/26/2021 1243   CHOLHDL 3.2 02/26/2021 1243   LDLCALC 129 (H) 02/26/2021 1243      Wt Readings from Last 3 Encounters:  02/26/21 171 lb (77.6 kg)  10/16/20 176 lb (79.8 kg)  03/26/20 178 lb (80.7 kg)      Other studies Reviewed: Additional studies/ records that were reviewed today include: ***. Review of the above records demonstrates: ***   ASSESSMENT AND PLAN:  1.  ***   Current medicines  are reviewed at length with the patient today.  The patient {ACTIONS; HAS/DOES NOT HAVE:19233} concerns regarding medicines.  The following changes have been made:  {PLAN; NO CHANGE:13088:s}  Labs/ tests ordered today include: *** No orders of the defined types were placed in this encounter.    Disposition:   FU with *** in {gen number 6-22:297989} {Days to years:10300}  Signed, Donis Kotowski [place], MD  03/05/2021 3:08 PM    [facility] [address] [place], [place], [address] Phone [phone], Fax [phone]

## 2021-03-15 ENCOUNTER — Inpatient Hospital Stay: Admission: RE | Admit: 2021-03-15 | Payer: Medicare Other | Source: Ambulatory Visit

## 2021-03-19 ENCOUNTER — Ambulatory Visit: Payer: Medicare Other | Admitting: Cardiology

## 2021-04-10 ENCOUNTER — Other Ambulatory Visit: Payer: Self-pay | Admitting: Nurse Practitioner

## 2021-04-10 DIAGNOSIS — F39 Unspecified mood [affective] disorder: Secondary | ICD-10-CM

## 2021-04-20 ENCOUNTER — Ambulatory Visit: Payer: Medicare Other | Admitting: Nurse Practitioner

## 2021-04-22 ENCOUNTER — Encounter: Payer: Self-pay | Admitting: Nurse Practitioner

## 2021-04-22 ENCOUNTER — Ambulatory Visit: Payer: Medicare Other | Admitting: Nurse Practitioner

## 2021-04-26 ENCOUNTER — Ambulatory Visit: Payer: Medicare Other | Admitting: Nurse Practitioner

## 2021-04-27 ENCOUNTER — Encounter: Payer: Medicaid Other | Admitting: Nurse Practitioner

## 2021-04-30 ENCOUNTER — Telehealth: Payer: Self-pay | Admitting: Nurse Practitioner

## 2021-04-30 ENCOUNTER — Encounter: Payer: Medicaid Other | Admitting: Nurse Practitioner

## 2021-04-30 ENCOUNTER — Encounter: Payer: Self-pay | Admitting: Nurse Practitioner

## 2021-04-30 ENCOUNTER — Ambulatory Visit (INDEPENDENT_AMBULATORY_CARE_PROVIDER_SITE_OTHER): Payer: Medicaid Other | Admitting: Nurse Practitioner

## 2021-04-30 DIAGNOSIS — R112 Nausea with vomiting, unspecified: Secondary | ICD-10-CM | POA: Diagnosis not present

## 2021-04-30 DIAGNOSIS — F39 Unspecified mood [affective] disorder: Secondary | ICD-10-CM

## 2021-04-30 DIAGNOSIS — K591 Functional diarrhea: Secondary | ICD-10-CM | POA: Diagnosis not present

## 2021-04-30 MED ORDER — QUETIAPINE FUMARATE 400 MG PO TABS: 800.0000 mg | ORAL_TABLET | Freq: Every day | ORAL | 0 refills | Status: AC

## 2021-04-30 MED ORDER — ONDANSETRON HCL 4 MG PO TABS: 4.0000 mg | ORAL_TABLET | Freq: Three times a day (TID) | ORAL | 0 refills | Status: AC | PRN

## 2021-04-30 NOTE — Progress Notes (Signed)
° °  Virtual Visit  Note Due to COVID-19 pandemic this visit was conducted virtually. This visit type was conducted due to national recommendations for restrictions regarding the COVID-19 Pandemic (e.g. social distancing, sheltering in place) in an effort to limit this patient's exposure and mitigate transmission in our community. All issues noted in this document were discussed and addressed.  A physical exam was not performed with this format.  I connected with [name] on 04/30/21 at 10:49 by telephone and verified that I am speaking with the correct person using two identifiers. [name] is currently located at home and no one is currently with her during visit. The provider, Mary-Margaret Hassell Done, FNP is located in their office at time of visit.  I discussed the limitations, risks, security and privacy concerns of performing an evaluation and management service by telephone and the availability of in person appointments. I also discussed with the patient that there may be a patient responsible charge related to this service. The patient expressed understanding and agreed to proceed.   History and Present Illness:  Patient states she has diarrhea and vomiting that started about 2 weeks ago and is intermittent. She says diarrhea is at least 3x a day. Smelling food upsets her stomach. Sheis able to keep liquids down.   * would like seroquel filled      Review of Systems  Constitutional:  Negative for chills, fever and malaise/fatigue.  HENT: Negative.    Respiratory: Negative.    Cardiovascular: Negative.   Gastrointestinal:  Positive for diarrhea, nausea and vomiting. Negative for abdominal pain.  Genitourinary: Negative.     Observations/Objective: Alert and oriented- answers all questions appropriately No distress   Assessment and Plan: [name] in today with chief complaint of No chief complaint on file.   1. Episodic mood disorder (HCC) Refill of meds -  QUEtiapine (SEROQUEL) 400 MG tablet; Take 2 tablets (800 mg total) by mouth at bedtime.  Dispense: 180 tablet; Refill: 0  2. Nausea and vomiting, unspecified vomiting type Bland diet Avoid spicy and fatty foods - ondansetron (ZOFRAN) 4 MG tablet; Take 1 tablet (4 mg total) by mouth every 8 (eight) hours as needed for nausea or vomiting.  Dispense: 20 tablet; Refill: 0  3. Functional diarrhea Imodium AD OTC If doe snot resolve by tuesday of next week will need to get stool specimen If worsen in the next 48 hours need to go to the ED    Follow Up Instructions: prn    I discussed the assessment and treatment plan with the patient. The patient was provided an opportunity to ask questions and all were answered. The patient agreed with the plan and demonstrated an understanding of the instructions.   The patient was advised to call back or seek an in-person evaluation if the symptoms worsen or if the condition fails to improve as anticipated.  The above assessment and management plan was discussed with the patient. The patient verbalized understanding of and has agreed to the management plan. Patient is aware to call the clinic if symptoms persist or worsen. Patient is aware when to return to the clinic for a follow-up visit. Patient educated on when it is appropriate to go to the emergency department.   Time call ended:  11:00  I provided 11 minutes of  non face-to-face time during this encounter.    Mary-Margaret Hassell Done, FNP

## 2021-04-30 NOTE — Patient Instructions (Signed)
Diarrhea, Adult °Diarrhea is when you pass loose and watery poop (stool) often. Diarrhea can make you feel weak and cause you to lose water in your body (get dehydrated). Losing water in your body can cause you to: °Feel tired and thirsty. °Have a dry mouth. °Go pee (urinate) less often. °Diarrhea often lasts 2-3 days. However, it can last longer if it is a sign of something more serious. It is important to treat your diarrhea as told by your doctor. °Follow these instructions at home: °Eating and drinking °  °Follow these instructions as told by your doctor: °Take an ORS (oral rehydration solution). This is a drink that helps you replace fluids and minerals your body lost. It is sold at pharmacies and stores. °Drink plenty of fluids, such as: °Water. °Ice chips. °Diluted fruit juice. °Low-calorie sports drinks. °Milk, if you want. °Avoid drinking fluids that have a lot of sugar or caffeine in them. °Eat bland, easy-to-digest foods in small amounts as you are able. These foods include: °Bananas. °Applesauce. °Rice. °Low-fat (lean) meats. °Toast. °Crackers. °Avoid alcohol. °Avoid spicy or fatty foods. ° °Medicines °Take over-the-counter and prescription medicines only as told by your doctor. °If you were prescribed an antibiotic medicine, take it as told by your doctor. Do not stop using the antibiotic even if you start to feel better. °General instructions ° °Wash your hands often using soap and water. If soap and water are not available, use a hand sanitizer. Others in your home should wash their hands as well. Hands should be washed: °After using the toilet or changing a diaper. °Before preparing, cooking, or serving food. °While caring for a sick person. °While visiting someone in a hospital. °Drink enough fluid to keep your pee (urine) pale yellow. °Rest at home while you get better. °Take a warm bath to help with any burning or pain from having diarrhea. °Watch your condition for any changes. °Keep all  follow-up visits as told by your doctor. This is important. °Contact a doctor if: °You have a fever. °Your diarrhea gets worse. °You have new symptoms. °You cannot keep fluids down. °You feel light-headed or dizzy. °You have a headache. °You have muscle cramps. °Get help right away if: °You have chest pain. °You feel very weak or you pass out (faint). °You have bloody or black poop or poop that looks like tar. °You have very bad pain, cramping, or bloating in your belly (abdomen). °You have trouble breathing or you are breathing very quickly. °Your heart is beating very quickly. °Your skin feels cold and clammy. °You feel confused. °You have signs of losing too much water in your body, such as: °Dark pee, very little pee, or no pee. °Cracked lips. °Dry mouth. °Sunken eyes. °Sleepiness. °Weakness. °Summary °Diarrhea is when you pass loose and watery poop (stool) often. °Diarrhea can make you feel weak and cause you to lose water in your body (get dehydrated). °Take an ORS (oral rehydration solution). This is a drink that is sold at pharmacies and stores. °Eat bland, easy-to-digest foods in small amounts as you are able. °Contact a doctor if your condition gets worse. Get help right away if you have signs that you have lost too much water in your body. °This information is not intended to replace advice given to you by your health care provider. Make sure you discuss any questions you have with your health care provider. °Document Revised: 11/04/2020 Document Reviewed: 11/04/2020 °Elsevier Patient Education © 2022 [employer] ° °

## 2021-05-11 ENCOUNTER — Encounter: Payer: Medicaid Other | Admitting: Nurse Practitioner

## 2021-05-12 ENCOUNTER — Encounter: Payer: Self-pay | Admitting: Nurse Practitioner

## 2021-05-28 ENCOUNTER — Encounter (HOSPITAL_COMMUNITY): Payer: Self-pay

## 2021-05-28 ENCOUNTER — Emergency Department (HOSPITAL_COMMUNITY)
Admission: EM | Admit: 2021-05-28 | Discharge: 2021-05-29 | Disposition: A | Discharge status: Other Acute Inpatient Hospital | Payer: Medicare Other | Attending: Emergency Medicine | Admitting: Emergency Medicine

## 2021-05-28 DIAGNOSIS — E119 Type 2 diabetes mellitus without complications: Secondary | ICD-10-CM | POA: Diagnosis not present

## 2021-05-28 DIAGNOSIS — I509 Heart failure, unspecified: Secondary | ICD-10-CM | POA: Insufficient documentation

## 2021-05-28 DIAGNOSIS — F339 Major depressive disorder, recurrent, unspecified: Secondary | ICD-10-CM | POA: Diagnosis not present

## 2021-05-28 DIAGNOSIS — F32A Depression, unspecified: Secondary | ICD-10-CM | POA: Diagnosis not present

## 2021-05-28 DIAGNOSIS — Z20822 Contact with and (suspected) exposure to covid-19: Secondary | ICD-10-CM | POA: Diagnosis not present

## 2021-05-28 DIAGNOSIS — F341 Dysthymic disorder: Secondary | ICD-10-CM | POA: Insufficient documentation

## 2021-05-28 DIAGNOSIS — E876 Hypokalemia: Secondary | ICD-10-CM | POA: Insufficient documentation

## 2021-05-28 DIAGNOSIS — R531 Weakness: Secondary | ICD-10-CM | POA: Diagnosis not present

## 2021-05-28 DIAGNOSIS — I110 Hypertensive heart disease with heart failure: Secondary | ICD-10-CM | POA: Insufficient documentation

## 2021-05-28 DIAGNOSIS — Z79899 Other long term (current) drug therapy: Secondary | ICD-10-CM | POA: Insufficient documentation

## 2021-05-28 DIAGNOSIS — Z743 Need for continuous supervision: Secondary | ICD-10-CM | POA: Diagnosis not present

## 2021-05-28 DIAGNOSIS — R9431 Abnormal electrocardiogram [ECG] [EKG]: Secondary | ICD-10-CM | POA: Diagnosis not present

## 2021-05-28 LAB — CBC WITH DIFFERENTIAL/PLATELET
Abs Immature Granulocytes: 0.04 10*3/uL (ref 0.00–0.07)
Basophils Absolute: 0.1 10*3/uL (ref 0.0–0.1)
Basophils Relative: 1 %
Eosinophils Absolute: 0.1 10*3/uL (ref 0.0–0.5)
Eosinophils Relative: 1 %
HCT: 43.2 % (ref 36.0–46.0)
Hemoglobin: 15 g/dL (ref 12.0–15.0)
Immature Granulocytes: 0 %
Lymphocytes Relative: 19 %
Lymphs Abs: 1.7 10*3/uL (ref 0.7–4.0)
MCH: 32.3 pg (ref 26.0–34.0)
MCHC: 34.7 g/dL (ref 30.0–36.0)
MCV: 93.1 fL (ref 80.0–100.0)
Monocytes Absolute: 0.9 10*3/uL (ref 0.1–1.0)
Monocytes Relative: 10 %
Neutro Abs: 6.3 10*3/uL (ref 1.7–7.7)
Neutrophils Relative %: 69 %
Platelets: 249 10*3/uL (ref 150–400)
RBC: 4.64 MIL/uL (ref 3.87–5.11)
RDW: 15.2 % (ref 11.5–15.5)
WBC: 9.1 10*3/uL (ref 4.0–10.5)
nRBC: 0 % (ref 0.0–0.2)

## 2021-05-28 LAB — COMPREHENSIVE METABOLIC PANEL
ALT: 15 U/L (ref 0–44)
AST: 26 U/L (ref 15–41)
Albumin: 4.1 g/dL (ref 3.5–5.0)
Alkaline Phosphatase: 132 U/L — ABNORMAL HIGH (ref 38–126)
Anion gap: 14 (ref 5–15)
BUN: 38 mg/dL — ABNORMAL HIGH (ref 8–23)
CO2: 26 mmol/L (ref 22–32)
Calcium: 9.1 mg/dL (ref 8.9–10.3)
Chloride: 96 mmol/L — ABNORMAL LOW (ref 98–111)
Creatinine, Ser: 1.29 mg/dL — ABNORMAL HIGH (ref 0.44–1.00)
GFR, Estimated: 46 mL/min — ABNORMAL LOW (ref >60)
Glucose, Bld: 107 mg/dL — ABNORMAL HIGH (ref 70–99)
Potassium: 2.6 mmol/L — CL (ref 3.5–5.1)
Sodium: 136 mmol/L (ref 135–145)
Total Bilirubin: 0.8 mg/dL (ref 0.3–1.2)
Total Protein: 8.1 g/dL (ref 6.5–8.1)

## 2021-05-28 LAB — BASIC METABOLIC PANEL
Anion gap: 11 (ref 5–15)
BUN: 33 mg/dL — ABNORMAL HIGH (ref 8–23)
CO2: 22 mmol/L (ref 22–32)
Calcium: 8.4 mg/dL — ABNORMAL LOW (ref 8.9–10.3)
Chloride: 101 mmol/L (ref 98–111)
Creatinine, Ser: 1.11 mg/dL — ABNORMAL HIGH (ref 0.44–1.00)
GFR, Estimated: 55 mL/min — ABNORMAL LOW (ref >60)
Glucose, Bld: 103 mg/dL — ABNORMAL HIGH (ref 70–99)
Potassium: 3 mmol/L — ABNORMAL LOW (ref 3.5–5.1)
Sodium: 134 mmol/L — ABNORMAL LOW (ref 135–145)

## 2021-05-28 LAB — ACETAMINOPHEN LEVEL: Acetaminophen (Tylenol), Serum: <10 ug/mL — ABNORMAL LOW (ref 10–30)

## 2021-05-28 LAB — RESP PANEL BY RT-PCR (FLU A&B, COVID) ARPGX2
Influenza A by PCR: NEGATIVE
Influenza B by PCR: NEGATIVE
SARS Coronavirus 2 by RT PCR: NEGATIVE

## 2021-05-28 LAB — ETHANOL: Alcohol, Ethyl (B): <10 mg/dL (ref <10)

## 2021-05-28 LAB — SALICYLATE LEVEL: Salicylate Lvl: <7 mg/dL — ABNORMAL LOW (ref 7.0–30.0)

## 2021-05-28 LAB — TSH: TSH: 2.804 u[IU]/mL (ref 0.350–4.500)

## 2021-05-28 MED ORDER — POTASSIUM CHLORIDE CRYS ER 20 MEQ PO TBCR
40.0000 meq | EXTENDED_RELEASE_TABLET | Freq: Once | ORAL | Status: AC
  Administered 2021-05-29: 40 meq via ORAL
  Filled 2021-05-28: qty 2

## 2021-05-28 MED ORDER — POTASSIUM CHLORIDE CRYS ER 20 MEQ PO TBCR
40.0000 meq | EXTENDED_RELEASE_TABLET | Freq: Once | ORAL | Status: AC
  Administered 2021-05-28: 40 meq via ORAL
  Filled 2021-05-28: qty 2

## 2021-05-28 MED ORDER — ALUM & MAG HYDROXIDE-SIMETH 200-200-20 MG/5ML PO SUSP: 30.0000 mL | Freq: Four times a day (QID) | ORAL | Status: DC | PRN

## 2021-05-28 MED ORDER — ONDANSETRON HCL 4 MG PO TABS: 4.0000 mg | ORAL_TABLET | Freq: Three times a day (TID) | ORAL | Status: DC | PRN

## 2021-05-28 MED ORDER — SODIUM CHLORIDE 0.9 % IV BOLUS
1000.0000 mL | Freq: Once | INTRAVENOUS | Status: AC
  Administered 2021-05-28: 1000 mL via INTRAVENOUS

## 2021-05-28 MED ORDER — ACETAMINOPHEN 325 MG PO TABS: 650.0000 mg | ORAL_TABLET | ORAL | Status: DC | PRN

## 2021-05-28 MED ORDER — POTASSIUM CHLORIDE 10 MEQ/100ML IV SOLN
10.0000 meq | Freq: Once | INTRAVENOUS | Status: AC
  Administered 2021-05-28: 10 meq via INTRAVENOUS
  Filled 2021-05-28: qty 100

## 2021-05-28 MED ORDER — MAGNESIUM SULFATE 2 GM/50ML IV SOLN
2.0000 g | INTRAVENOUS | Status: AC
  Administered 2021-05-28: 2 g via INTRAVENOUS
  Filled 2021-05-28: qty 50

## 2021-05-28 MED ORDER — MELATONIN 3 MG PO TABS
3.0000 mg | ORAL_TABLET | Freq: Every day | ORAL | Status: DC
  Administered 2021-05-28: 3 mg via ORAL
  Filled 2021-05-28: qty 1

## 2021-05-28 NOTE — ED Triage Notes (Signed)
IVC by family because she is weak and can not walk. House is unkept and dirty, son who lives next to him son reports she is hallucinating, pill bottles in house and house is unkept.

## 2021-05-28 NOTE — ED Provider Notes (Signed)
[facility] Provider Note   CSN: [id number] Arrival date & time: 05/28/21  1410     History  No chief complaint on file.   [name] is a 66 y.o. female.  Per patients     This patient is a 66 year old female, she has struggled with depression and states that she does take Lexapro, she denies having hypertension, she does not take any medicine for cholesterol diabetes high blood pressure heart disease and states that over the last 4 days she has actually not had any of her medications in fact she has not wanted to get out of bed she has been very tired, she has been very depressed, she states that there are several things that have been going on over the last couple of months, in fact it has been about 3 months that she has had worsening depression.  She was formally diagnosed with depression many many many years ago, she does not have any hallucinations and denies any suicidal thoughts and states she has never had suicidal thoughts.  Her son went over to her house today and found it extremely disheveled, she has not had a shower in 4 days has not eaten or drinking in 4 days and has wanted to sleep the entire time.  She states that she is severely depressed.  She will not talk to me anymore about it at this time.  She is agreeable to be here voluntarily at this time, she states "I want to do it for my son"  Home Medications Prior to Admission medications   Medication Sig Start Date End Date Taking? Authorizing Provider  acetaminophen (TYLENOL) 650 MG CR tablet Take 650 mg by mouth every 8 (eight) hours as needed for pain.   Yes [provider]  ALPRAZolam Duanne Moron) 1 MG tablet Take 1 tab QAM, 1/2 tab in the day and 1 tab QPM 02/26/21  Yes Hassell Done, Mary-Margaret, FNP  atorvastatin (LIPITOR) 80 MG tablet Take 1 tablet (80 mg total) by mouth daily. 02/26/21  Yes Martin, Mary-Margaret, FNP  buPROPion (WELLBUTRIN XL) 300 MG 24 hr tablet Take 1 tablet (300 mg  total) by mouth daily. 02/26/21  Yes Martin, Mary-Margaret, FNP  DULoxetine (CYMBALTA) 60 MG capsule Take 1-2 capsules (60-120 mg total) by mouth daily. 02/26/21  Yes Hassell Done, Mary-Margaret, FNP  furosemide (LASIX) 20 MG tablet TAKE 1 TABLET BY MOUTH EVERY DAY 12/14/20  Yes Hassell Done, Mary-Margaret, FNP  linaclotide (LINZESS) 290 MCG CAPS capsule Take 1 capsule (290 mcg total) by mouth daily as needed (For constipation.). 03/26/20  Yes Martin, Mary-Margaret, FNP  ondansetron (ZOFRAN) 4 MG tablet Take 1 tablet (4 mg total) by mouth every 8 (eight) hours as needed for nausea or vomiting. 04/30/21  Yes Martin, Mary-Margaret, FNP  pantoprazole (PROTONIX) 40 MG tablet Take 1 tablet (40 mg total) by mouth daily. Take 30 minutes before breakfast daily. 02/26/21  Yes Martin, Mary-Margaret, FNP  QUEtiapine (SEROQUEL) 400 MG tablet Take 2 tablets (800 mg total) by mouth at bedtime. 04/30/21  Yes Hassell Done, Mary-Margaret, FNP  SUMAtriptan (IMITREX) 50 MG tablet Take 1 tablet (50 mg total) by mouth every 2 (two) hours as needed for migraine. May repeat in 2 hours if headache persists or recurs. Headache Treatment 10/16/20  Yes Hassell Done, Mary-Margaret, FNP  topiramate (TOPAMAX) 100 MG tablet Take 1 tablet (100 mg total) by mouth 2 (two) times daily. Headache prevention 10/16/20  Yes Hassell Done, Mary-Margaret, FNP      Allergies    Morphine and  related    Review of Systems   Review of Systems  All other systems reviewed and are negative.  Physical Exam Updated Vital Signs BP 100/87 (BP Location: Right Arm)    Pulse 94    Temp 98.1 F (36.7 C) (Oral)    Resp 18    Ht 1.575 m (5\' 2" )    Wt 78 kg    SpO2 98%    BMI 31.45 kg/m  Physical Exam Vitals and nursing note reviewed.  Constitutional:      General: She is not in acute distress.    Appearance: She is well-developed.  HENT:     Head: Normocephalic and atraumatic.     Mouth/Throat:     Mouth: Mucous membranes are dry.     Pharynx: No oropharyngeal exudate.  Eyes:      General: No scleral icterus.       Right eye: No discharge.        Left eye: No discharge.     Conjunctiva/sclera: Conjunctivae normal.     Pupils: Pupils are equal, round, and reactive to light.  Neck:     Thyroid: No thyromegaly.     Vascular: No JVD.  Cardiovascular:     Rate and Rhythm: Normal rate and regular rhythm.     Heart sounds: Normal heart sounds. No murmur heard.   No friction rub. No gallop.  Pulmonary:     Effort: Pulmonary effort is normal. No respiratory distress.     Breath sounds: Normal breath sounds. No wheezing or rales.  Abdominal:     General: Bowel sounds are normal. There is no distension.     Palpations: Abdomen is soft. There is no mass.     Tenderness: There is no abdominal tenderness.  Musculoskeletal:        General: No tenderness. Normal range of motion.     Cervical back: Normal range of motion and neck supple.  Lymphadenopathy:     Cervical: No cervical adenopathy.  Skin:    General: Skin is warm and dry.     Findings: No erythema or rash.  Neurological:     Mental Status: She is alert.     Coordination: Coordination normal.  Psychiatric:        Behavior: Behavior normal.     Comments: The patient definitely has a very flat affect, denies suicidality, denies substance abuse, not responding to internal stimuli    ED Results / Procedures / Treatments   Labs (all labs ordered are listed, but only abnormal results are displayed) Labs Reviewed  COMPREHENSIVE METABOLIC PANEL - Abnormal; Notable for the following components:      Result Value   Potassium 2.6 (*)    Chloride 96 (*)    Glucose, Bld 107 (*)    BUN 38 (*)    Creatinine, Ser 1.29 (*)    Alkaline Phosphatase 132 (*)    GFR, Estimated 46 (*)    All other components within normal limits  ACETAMINOPHEN LEVEL - Abnormal; Notable for the following components:   Acetaminophen (Tylenol), Serum <10 (*)    All other components within normal limits  SALICYLATE LEVEL - Abnormal;  Notable for the following components:   Salicylate Lvl <5.3 (*)    All other components within normal limits  RESP PANEL BY RT-PCR (FLU A&B, COVID) ARPGX2  ETHANOL  CBC WITH DIFFERENTIAL/PLATELET  TSH  RAPID URINE DRUG SCREEN, HOSP PERFORMED    EKG None  Radiology No results found.  Procedures Procedures  Medications Ordered in ED Medications  ondansetron (ZOFRAN) tablet 4 mg (has no administration in time range)  alum & mag hydroxide-simeth (MAALOX/MYLANTA) 200-200-20 MG/5ML suspension 30 mL (has no administration in time range)  acetaminophen (TYLENOL) tablet 650 mg (has no administration in time range)  magnesium sulfate IVPB 2 g 50 mL (has no administration in time range)  potassium chloride 10 mEq in 100 mL IVPB (has no administration in time range)  sodium chloride 0.9 % bolus 1,000 mL (1,000 mLs Intravenous New Bag/Given 05/28/21 1852)  potassium chloride SA (KLOR-CON M) CR tablet 40 mEq (40 mEq Oral Given 05/28/21 1854)    ED Course/ Medical Decision Making/ A&P                           Medical Decision Making The patient appears to be severely depressed, there may be an underlying medical issue and thus we will check labs including a thyroid panel however her vital signs are rather unremarkable, she is not tachycardic or febrile, she has a normal temperature, her physical exam is unremarkable.  Her mental status and psychiatric exam is of concern for worsening progressive depression and that she will need to be evaluated by psychiatry.  I will uphold the IVC at this time  Amount and/or Complexity of Data Reviewed Labs: ordered.  Risk OTC drugs. Prescription drug management.   This patient is actively getting potassium and magnesium supplementation for hypokalemia however she is overall medically cleared for evaluation by psychiatry.  I suspect that over the next 6 to 8 hours her potassium level will be back to normal and she will be adequately hydrated for  placement.  She has severe depression and will likely need to be placed with inpatient treatment.  At this time at 7:00 PM the patient is considered medically cleared for psychiatric evaluation        Final Clinical Impression(s) / ED Diagnoses Final diagnoses:  Persistent depressive disorder    Rx / DC Orders ED Discharge Orders     None         Noemi Chapel, MD 05/28/21 [phone]

## 2021-05-28 NOTE — ED Notes (Signed)
Meds late due to pt in hallway and not on monitor.

## 2021-05-29 DIAGNOSIS — F341 Dysthymic disorder: Secondary | ICD-10-CM | POA: Diagnosis not present

## 2021-05-29 DIAGNOSIS — F32A Depression, unspecified: Secondary | ICD-10-CM | POA: Diagnosis not present

## 2021-05-29 MED ORDER — BUPROPION HCL ER (XL) 150 MG PO TB24
300.0000 mg | ORAL_TABLET | Freq: Every day | ORAL | Status: DC
  Administered 2021-05-29: 300 mg via ORAL
  Filled 2021-05-29: qty 2

## 2021-05-29 MED ORDER — TOPIRAMATE 25 MG PO TABS
100.0000 mg | ORAL_TABLET | Freq: Two times a day (BID) | ORAL | Status: DC
  Administered 2021-05-29: 100 mg via ORAL
  Filled 2021-05-29: qty 4

## 2021-05-29 MED ORDER — DULOXETINE HCL 30 MG PO CPEP
60.0000 mg | ORAL_CAPSULE | Freq: Every day | ORAL | Status: DC
  Administered 2021-05-29: 60 mg via ORAL
  Filled 2021-05-29: qty 2

## 2021-05-29 MED ORDER — LINACLOTIDE 145 MCG PO CAPS: 290.0000 ug | ORAL_CAPSULE | Freq: Every day | ORAL | Status: DC | PRN

## 2021-05-29 MED ORDER — LORAZEPAM 1 MG PO TABS
1.0000 mg | ORAL_TABLET | Freq: Every day | ORAL | Status: DC
  Administered 2021-05-29: 1 mg via ORAL
  Filled 2021-05-29: qty 1

## 2021-05-29 MED ORDER — ATORVASTATIN CALCIUM 40 MG PO TABS
80.0000 mg | ORAL_TABLET | Freq: Every day | ORAL | Status: DC
  Administered 2021-05-29: 80 mg via ORAL
  Filled 2021-05-29: qty 2

## 2021-05-29 MED ORDER — DULOXETINE HCL 30 MG PO CPEP: 60.0000 mg | ORAL_CAPSULE | Freq: Every day | ORAL | Status: DC

## 2021-05-29 MED ORDER — LORAZEPAM 0.5 MG PO TABS: 0.5000 mg | ORAL_TABLET | Freq: Every evening | ORAL | Status: DC | PRN

## 2021-05-29 MED ORDER — FUROSEMIDE 40 MG PO TABS
20.0000 mg | ORAL_TABLET | Freq: Every day | ORAL | Status: DC
  Administered 2021-05-29: 20 mg via ORAL
  Filled 2021-05-29: qty 1

## 2021-05-29 MED ORDER — PANTOPRAZOLE SODIUM 40 MG PO TBEC
40.0000 mg | DELAYED_RELEASE_TABLET | Freq: Every day | ORAL | Status: DC
  Administered 2021-05-29: 40 mg via ORAL
  Filled 2021-05-29: qty 1

## 2021-05-29 MED ORDER — QUETIAPINE FUMARATE 100 MG PO TABS: 800.0000 mg | ORAL_TABLET | Freq: Every day | ORAL | Status: DC

## 2021-05-29 NOTE — Progress Notes (Signed)
Per Shalon Bobbitt,NP, patient meets criteria for inpatient treatment. There are no available or appropriate beds at [facility] today. CSW faxed referrals to the following facilities for review: ° °[facility]  Pending - No Request Sent N/A [address] [place] [address] [phone] --  °[facility]  Pending - No Request Sent N/A [address] [place] [address] [phone] --  °[facility]  Pending - No Request Sent N/A [address], [place] [address] [phone] --  °[facility]  Pending - No Request Sent N/A [address], [place] [address] [phone] --  °[facility]  Pending - No Request Sent N/A [address] [place] [address] [phone] --  °[facility]  Pending - No Request Sent N/A [address], [place] [address] [phone] --  °[facility]  Pending - No Request Sent N/A [address], [place] [address] [phone] --  °[facility]  Pending - No Request Sent N/A [address], [place]  [address] [phone] --  ° ° °TTS will continue to seek bed placement. ° °Ethie Curless, MSW, LCSW-A, LCAS-A °Phone: [phone] °Disposition/TOC ° °

## 2021-05-29 NOTE — Progress Notes (Signed)
Per Gloria,Admissions, pt has been accepted to [facility] unit. Accepting provider is Dr. Franchot Mimes. Patient can arrive anytime after 4:00pm. Number for report is [phone].   Glennie Isle, MSW, LCSW-A Phone: [phone] Disposition/TOC

## 2021-05-29 NOTE — ED Notes (Signed)
IVC paperwork faxed to [facility] @ [phone]

## 2021-05-29 NOTE — ED Provider Notes (Signed)
Patient has been accepted at [facility], stable for transport   Noemi Chapel, MD 05/29/21 [phone]

## 2021-05-29 NOTE — ED Provider Notes (Signed)
Emergency Medicine Observation Re-evaluation Note  [name] is a 66 y.o. female, seen on rounds today.  Pt initially presented to the ED for complaints of Depression and Delusional Currently, the patient is resting.  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  Recent changes in the last 24 hours include psychiatry assessment recommending inpatient care.  Potassium replaced  Plan  Current plan is for inpatient care. [name] is under involuntary commitment.      Dorie Rank, MD 05/29/21 0830

## 2021-05-29 NOTE — BH Assessment (Signed)
Comprehensive Clinical Assessment (CCA) Note  05/29/2021 [name] [id number]  Disposition: Quintella Reichert, NP, patient meets inpatient criteria. Geropsychiatry recommended. Disposition SW to secure placement. Lilia Pro, RN, informed of disposition.  The patient demonstrates the following risk factors for suicide: Chronic risk factors for suicide include: psychiatric disorder of depression and medical illness yes . Acute risk factors for suicide include: social withdrawal/isolation. Protective factors for this patient include: positive social support, responsibility to others (children, family), and hope for the future. Considering these factors, the overall suicide risk at this point appears to be high. Patient is not appropriate for outpatient follow up.  [place] ED from 05/28/2021 in [place] No Risk      [name] is a 66 year old female presenting under IVC to [facility] due to not caring for self, hallucinations and worsening depression. Patient denied SI, HI and drugs/alcohol. Patient reported "I am stressed out and seeing things". Patient unable to give details on delusions/visual hallucinations. Patient reported main stressors include, "being at home stresses and I don't feel safe". Patient admitted to not taking her medications, stating "I just forgot to take it". Patient hasn't taken medications in weeks. Patient admitted to not eating and not sleeping. Patient denied prior psych hospitalizations, suicide attempts and self harming behaviors. Patient denied receiving outpatient therapy and denied being prescribed any psych medications. Patient resides alone and has 2 adult children, her son lives next to her.   Collateral Contact and Petitioner, son, Jazmarie Biever, [phone], consent received from patient. Abe People reported patient is weak and can't walk, house is unkept and dirty, patient is hallucinating, pill bottles all over the house.  Patient is not eating and not sleeping. Abe People reported that patients house is currently unlivable. Abe People is concerned that patient will harm herself as she continues to send "weird messages, like, just remember I will always love you"  Chief Complaint:  Chief Complaint  Patient presents with   Depression   Delusional   Visit Diagnosis:  Major depressive disorder   CCA Screening, Triage and Referral (STR)  Patient Reported Information How did you hear about [place]? Legal System  What Is the Reason for Your Visit/Call Today? IVC due to poor daily living skills, no longer caring for self.  How Long Has This Been Causing You Problems? 1 wk - 1 month  What Do You Feel Would Help You the Most Today? Treatment for Depression or other mood problem   Have You Recently Had Any Thoughts About Hurting Yourself? No  Are You Planning to Commit Suicide/Harm Yourself At This time? No   Have you Recently Had Thoughts About [place]? No  Are You Planning to Harm Someone at This Time? No  Explanation: No data recorded  Have You Used Any Alcohol or Drugs in the Past 24 Hours? No  How Long Ago Did You Use Drugs or Alcohol? No data recorded What Did You Use and How Much? No data recorded  Do You Currently Have a Therapist/Psychiatrist? No  Name of Therapist/Psychiatrist: No data recorded  Have You Been Recently Discharged From Any Office Practice or Programs? No  Explanation of Discharge From Practice/Program: No data recorded    CCA Screening Triage Referral Assessment Type of Contact: Tele-Assessment  Telemedicine Service Delivery:   Is this Initial or Reassessment? Initial Assessment  Date Telepsych consult ordered in [facility]:  05/28/21  Time Telepsych consult ordered in [facility]:  1618  Location of Assessment: [facility]  Provider Location: [facility]   Collateral Involvement: none reported   Does Patient Have a Stage manager Guardian? No data  recorded Name and Contact of Legal Guardian: No data recorded If Minor and Not Living with Parent(s), Who has Custody? No data recorded Is CPS involved or ever been involved? Never  Is APS involved or ever been involved? Never   Patient Determined To Be At Risk for Harm To Self or Others Based on Review of Patient Reported Information or Presenting Complaint? No data recorded Method: No data recorded Availability of Means: No data recorded Intent: No data recorded Notification Required: No data recorded Additional Information for Danger to Others Potential: No data recorded Additional Comments for Danger to Others Potential: No data recorded Are There Guns or Other Weapons in Your Home? No data recorded Types of Guns/Weapons: No data recorded Are These Weapons Safely Secured?                            No data recorded Who Could Verify You Are Able To Have These Secured: No data recorded Do You Have any Outstanding Charges, Pending Court Dates, Parole/Probation? No data recorded Contacted To Inform of Risk of Harm To Self or Others: No data recorded   Does Patient Present under Involuntary Commitment? Yes  IVC Papers Initial File Date: 05/28/21   [place] of Residence: [place]   Patient Currently Receiving the Following Services: Not Receiving Services   Determination of Need: Urgent (48 hours)   Options For Referral: Outpatient Therapy; Inpatient Hospitalization; Medication Management     CCA Biopsychosocial Patient Reported Schizophrenia/Schizoaffective Diagnosis in Past: No data recorded  Strengths: self-awareness   Mental Health Symptoms Depression:   Hopelessness; Fatigue; Tearfulness; Sleep (too much or little)   Duration of Depressive symptoms:    Mania:   None   Anxiety:    Worrying; Tension; Fatigue   Psychosis:   Hallucinations; Delusions   Duration of Psychotic symptoms:  Duration of Psychotic Symptoms: -- (uta)   Trauma:   None    Obsessions:   None   Compulsions:   None   Inattention:   None   Hyperactivity/Impulsivity:   None   Oppositional/Defiant Behaviors:   None   Emotional Irregularity:   None   Other Mood/Personality Symptoms:  No data recorded   Mental Status Exam Appearance and self-care  Stature:   Average   Weight:   Average weight   Clothing:   Casual   Grooming:   Normal   Cosmetic use:   None   Posture/gait:   Normal   Motor activity:   Not Remarkable   Sensorium  Attention:   Normal   Concentration:   Normal   Orientation:   X5   Recall/memory:   Normal   Affect and Mood  Affect:   Appropriate; Depressed   Mood:   Depressed   Relating  Eye contact:   Normal   Facial expression:   Depressed; Sad; Responsive   Attitude toward examiner:   Cooperative   Thought and Language  Speech flow:  Clear and Coherent   Thought content:   Appropriate to Mood and Circumstances   Preoccupation:   None   Hallucinations:   Other (Comment) (delusions)   Organization:  No data recorded  [employer] of Knowledge:   Average   Intelligence:   Average   Abstraction:  No data recorded  Judgement:   Poor  Reality Testing:   Distorted   Insight:   Flashes of insight   Decision Making:   Confused; Impulsive   Social Functioning  Social Maturity:  No data recorded  Social Judgement:   Heedless   Stress  Stressors:   Transitions   Coping Ability:   Exhausted; Overwhelmed   Skill Deficits:   Activities of daily living; Self-care; Decision making; Responsibility   Supports:   Family     Religion: Religion/Spirituality Are You A Religious Person?:  Special educational needs teacher)  Leisure/Recreation: Leisure / Recreation Do You Have Hobbies?: Yes Leisure and Hobbies: horseback riding, I have 2 horses  Exercise/Diet: Exercise/Diet Do You Exercise?: No Do You Follow a Special Diet?: No Do You Have Any Trouble Sleeping?:  Yes Explanation of Sleeping Difficulties: 8+ hours during day and at night   CCA Employment/Education Employment/Work Situation: Employment / Work Situation Employment Situation: On disability Why is Patient on Disability: medical ? 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)  Education: Education Is Patient Currently Attending School?: No Last Grade Completed:  (uta)   CCA Family/Childhood History Family and Relationship History: Family history Marital status: Single Does patient have children?: Yes How many children?: 2 How is patient's relationship with their children?: good  Childhood History:  Childhood History By whom was/is the patient raised?: Mother Did patient suffer any verbal/emotional/physical/sexual abuse as a child?: No Did patient suffer from severe childhood neglect?: No Has patient ever been sexually abused/assaulted/raped as an adolescent or adult?: No Was the patient ever a victim of a crime or a disaster?:  (uta) Witnessed domestic violence?:  (uta) Has patient been affected by domestic violence as an adult?:  Special educational needs teacher)  Child/Adolescent Assessment:     CCA Substance Use Alcohol/Drug Use: Alcohol / Drug Use Pain Medications: see MAR Prescriptions: see MAR Over the Counter: see MAR History of alcohol / drug use?: No history of alcohol / drug abuse                         ASAM's:  Six Dimensions of Multidimensional Assessment  Dimension 1:  Acute Intoxication and/or Withdrawal Potential:      Dimension 2:  Biomedical Conditions and Complications:      Dimension 3:  Emotional, Behavioral, or Cognitive Conditions and Complications:     Dimension 4:  Readiness to Change:     Dimension 5:  Relapse, Continued use, or Continued Problem Potential:     Dimension 6:  Recovery/Living Environment:     ASAM Severity Score:    ASAM Recommended Level of Treatment:     Substance use Disorder  (SUD)    Recommendations for Services/Supports/Treatments:    Discharge Disposition:    DSM5 Diagnoses: Patient Active Problem List   Diagnosis Date Noted   Migraine 03/11/2018   Narcotic drug use 03/11/2018   Depression, recurrent ([place]) 12/18/2016   Osteopenia of multiple sites 10/06/2016   Episodic mood disorder ([place]) 05/17/2016   Pain management contract broken 04/05/2016   Essential hypertension 03/01/2016   Insomnia 03/01/2016   Hyperlipemia 03/01/2016   GAD (generalized anxiety disorder) 03/01/2016   S/P Nissen fundoplication (without gastrostomy tube) procedure 10/02/2015   Hiatal hernia    Dysphagia 07/27/2015   Nausea alone 12/05/2013   PUD (peptic ulcer disease) 09/05/2013   Constipation 09/05/2013   Overweight (BMI 25.0-29.9) 07/16/2013   S/P left UKR 07/15/2013   Rectal bleeding 02/05/2013     Referrals  to Alternative Service(s): Referred to Alternative Service(s):   Place:   Date:   Time:    Referred to Alternative Service(s):   Place:   Date:   Time:    Referred to Alternative Service(s):   Place:   Date:   Time:    Referred to Alternative Service(s):   Place:   Date:   Time:     Venora Maples, [facility]

## 2021-05-31 ENCOUNTER — Telehealth: Payer: Self-pay

## 2021-05-31 NOTE — Telephone Encounter (Signed)
Transition Care Management Unsuccessful Follow-up Telephone Call  Date of discharge and from where:  05/30/2021-[place]   Attempts:  1st Attempt  Reason for unsuccessful TCM follow-up call:  Unable to leave message

## 2021-06-01 NOTE — Telephone Encounter (Signed)
Transition Care Management Unsuccessful Follow-up Telephone Call  Date of discharge and from where:  05/30/2021 from [facility]  Attempts:  2nd Attempt  Reason for unsuccessful TCM follow-up call:  Unable to leave message

## 2021-06-02 NOTE — Telephone Encounter (Signed)
Transition Care Management Unsuccessful Follow-up Telephone Call  Date of discharge and from where:  05/30/2021-[place]   Attempts:  3rd Attempt  Reason for unsuccessful TCM follow-up call:  Unable to leave message

## 2021-06-04 ENCOUNTER — Telehealth: Payer: Self-pay | Admitting: Nurse Practitioner

## 2021-06-04 NOTE — Telephone Encounter (Signed)
LMTCB

## 2021-06-07 NOTE — Telephone Encounter (Signed)
Pt has hospital follow up appt scheduled for 06/11/21 with MMM at 11:00.

## 2021-06-11 ENCOUNTER — Ambulatory Visit: Payer: Medicare Other | Admitting: Nurse Practitioner

## 2021-06-14 ENCOUNTER — Telehealth: Payer: Self-pay

## 2021-06-14 ENCOUNTER — Encounter: Payer: Self-pay | Admitting: Nurse Practitioner

## 2021-06-14 DIAGNOSIS — Z79899 Other long term (current) drug therapy: Secondary | ICD-10-CM | POA: Diagnosis not present

## 2021-06-14 DIAGNOSIS — M25561 Pain in right knee: Secondary | ICD-10-CM | POA: Diagnosis not present

## 2021-06-14 DIAGNOSIS — M25562 Pain in left knee: Secondary | ICD-10-CM | POA: Diagnosis not present

## 2021-06-14 DIAGNOSIS — F339 Major depressive disorder, recurrent, unspecified: Secondary | ICD-10-CM | POA: Diagnosis not present

## 2021-06-14 DIAGNOSIS — M545 Low back pain, unspecified: Secondary | ICD-10-CM | POA: Diagnosis not present

## 2021-06-14 DIAGNOSIS — F411 Generalized anxiety disorder: Secondary | ICD-10-CM | POA: Diagnosis not present

## 2021-06-14 DIAGNOSIS — F39 Unspecified mood [affective] disorder: Secondary | ICD-10-CM | POA: Diagnosis not present

## 2021-06-14 DIAGNOSIS — G8929 Other chronic pain: Secondary | ICD-10-CM | POA: Diagnosis not present

## 2021-06-14 NOTE — Telephone Encounter (Signed)
Spoke with patient regarding her missed appt.  Patient said that she now goes to [facility] for all of her primary care needs.

## 2021-07-26 DIAGNOSIS — Z79899 Other long term (current) drug therapy: Secondary | ICD-10-CM | POA: Diagnosis not present

## 2021-07-26 DIAGNOSIS — E782 Mixed hyperlipidemia: Secondary | ICD-10-CM | POA: Diagnosis not present

## 2021-08-30 ENCOUNTER — Ambulatory Visit: Payer: Medicare Other | Admitting: Nurse Practitioner

## 2021-09-27 DIAGNOSIS — F411 Generalized anxiety disorder: Secondary | ICD-10-CM | POA: Diagnosis not present

## 2021-09-27 DIAGNOSIS — F339 Major depressive disorder, recurrent, unspecified: Secondary | ICD-10-CM | POA: Diagnosis not present

## 2021-09-27 DIAGNOSIS — Z0000 Encounter for general adult medical examination without abnormal findings: Secondary | ICD-10-CM | POA: Diagnosis not present

## 2021-09-27 DIAGNOSIS — M8589 Other specified disorders of bone density and structure, multiple sites: Secondary | ICD-10-CM | POA: Diagnosis not present

## 2021-09-27 DIAGNOSIS — F39 Unspecified mood [affective] disorder: Secondary | ICD-10-CM | POA: Diagnosis not present

## 2021-09-27 DIAGNOSIS — E782 Mixed hyperlipidemia: Secondary | ICD-10-CM | POA: Diagnosis not present

## 2021-09-27 DIAGNOSIS — Z5941 Food insecurity: Secondary | ICD-10-CM | POA: Diagnosis not present

## 2021-09-27 DIAGNOSIS — I10 Essential (primary) hypertension: Secondary | ICD-10-CM | POA: Diagnosis not present

## 2021-10-05 ENCOUNTER — Other Ambulatory Visit: Payer: Self-pay | Admitting: Physician Assistant

## 2021-10-05 DIAGNOSIS — M8589 Other specified disorders of bone density and structure, multiple sites: Secondary | ICD-10-CM

## 2022-03-04 DIAGNOSIS — Z1231 Encounter for screening mammogram for malignant neoplasm of breast: Secondary | ICD-10-CM | POA: Diagnosis not present

## 2022-09-30 DIAGNOSIS — Z0000 Encounter for general adult medical examination without abnormal findings: Secondary | ICD-10-CM | POA: Diagnosis not present

## 2022-09-30 DIAGNOSIS — Z6831 Body mass index (BMI) 31.0-31.9, adult: Secondary | ICD-10-CM | POA: Diagnosis not present

## 2022-09-30 DIAGNOSIS — E559 Vitamin D deficiency, unspecified: Secondary | ICD-10-CM | POA: Diagnosis not present

## 2022-09-30 DIAGNOSIS — E782 Mixed hyperlipidemia: Secondary | ICD-10-CM | POA: Diagnosis not present

## 2022-09-30 DIAGNOSIS — M8589 Other specified disorders of bone density and structure, multiple sites: Secondary | ICD-10-CM | POA: Diagnosis not present

## 2022-09-30 DIAGNOSIS — F39 Unspecified mood [affective] disorder: Secondary | ICD-10-CM | POA: Diagnosis not present

## 2022-09-30 DIAGNOSIS — F339 Major depressive disorder, recurrent, unspecified: Secondary | ICD-10-CM | POA: Diagnosis not present

## 2022-09-30 DIAGNOSIS — E6609 Other obesity due to excess calories: Secondary | ICD-10-CM | POA: Diagnosis not present

## 2022-09-30 DIAGNOSIS — I10 Essential (primary) hypertension: Secondary | ICD-10-CM | POA: Diagnosis not present

## 2022-10-13 DIAGNOSIS — R7989 Other specified abnormal findings of blood chemistry: Secondary | ICD-10-CM | POA: Diagnosis not present

## 2022-10-13 DIAGNOSIS — R5383 Other fatigue: Secondary | ICD-10-CM | POA: Diagnosis not present

## 2023-01-18 DIAGNOSIS — E039 Hypothyroidism, unspecified: Secondary | ICD-10-CM | POA: Diagnosis not present

## 2023-01-25 ENCOUNTER — Encounter: Payer: Self-pay | Admitting: *Deleted
